# Patient Record
Sex: Female | Born: 1935 | Race: White | Hispanic: No | Smoking: Former smoker
Health system: Southern US, Community
[De-identification: ages and names within clinical notes are randomized; demographics above are authoritative.]

## PROBLEM LIST (undated history)

## (undated) DIAGNOSIS — E079 Disorder of thyroid, unspecified: Secondary | ICD-10-CM

## (undated) DIAGNOSIS — C50919 Malignant neoplasm of unspecified site of unspecified female breast: Secondary | ICD-10-CM

## (undated) DIAGNOSIS — I10 Essential (primary) hypertension: Secondary | ICD-10-CM

## (undated) HISTORY — PX: TONSILLECTOMY: SUR1361

## (undated) HISTORY — PX: ABDOMINAL HYSTERECTOMY: SHX81

## (undated) HISTORY — PX: APPENDECTOMY: SHX54

---

## 2004-12-22 ENCOUNTER — Ambulatory Visit: Payer: Self-pay | Admitting: Family Medicine

## 2006-02-03 ENCOUNTER — Ambulatory Visit: Payer: Self-pay | Admitting: Family Medicine

## 2007-02-08 ENCOUNTER — Ambulatory Visit: Payer: Self-pay | Admitting: Family Medicine

## 2008-02-10 ENCOUNTER — Ambulatory Visit: Payer: Self-pay | Admitting: Family Medicine

## 2009-02-13 ENCOUNTER — Ambulatory Visit: Payer: Self-pay | Admitting: Family Medicine

## 2010-02-17 ENCOUNTER — Ambulatory Visit: Payer: Self-pay | Admitting: Family Medicine

## 2010-07-06 DIAGNOSIS — C50919 Malignant neoplasm of unspecified site of unspecified female breast: Secondary | ICD-10-CM

## 2010-07-06 HISTORY — PX: MASTECTOMY: SHX3

## 2010-07-06 HISTORY — DX: Malignant neoplasm of unspecified site of unspecified female breast: C50.919

## 2010-08-20 ENCOUNTER — Ambulatory Visit: Payer: Self-pay

## 2010-09-18 ENCOUNTER — Ambulatory Visit: Payer: Self-pay | Admitting: Surgery

## 2010-09-19 LAB — PATHOLOGY REPORT

## 2010-10-01 ENCOUNTER — Ambulatory Visit: Payer: Self-pay | Admitting: Surgery

## 2010-10-06 LAB — PATHOLOGY REPORT

## 2010-10-15 ENCOUNTER — Ambulatory Visit: Payer: Self-pay | Admitting: Oncology

## 2010-11-04 ENCOUNTER — Ambulatory Visit: Payer: Self-pay | Admitting: Oncology

## 2011-01-14 ENCOUNTER — Ambulatory Visit: Payer: Self-pay | Admitting: Oncology

## 2011-02-04 ENCOUNTER — Ambulatory Visit: Payer: Self-pay | Admitting: Oncology

## 2011-02-19 ENCOUNTER — Ambulatory Visit: Payer: Self-pay | Admitting: Surgery

## 2011-04-15 ENCOUNTER — Ambulatory Visit: Payer: Self-pay | Admitting: Oncology

## 2011-05-07 ENCOUNTER — Ambulatory Visit: Payer: Self-pay | Admitting: Oncology

## 2011-07-16 ENCOUNTER — Ambulatory Visit: Payer: Self-pay | Admitting: Oncology

## 2011-08-07 ENCOUNTER — Ambulatory Visit: Payer: Self-pay | Admitting: Oncology

## 2011-10-15 ENCOUNTER — Ambulatory Visit: Payer: Self-pay | Admitting: Oncology

## 2011-11-04 ENCOUNTER — Ambulatory Visit: Payer: Self-pay | Admitting: Oncology

## 2011-12-05 ENCOUNTER — Ambulatory Visit: Payer: Self-pay | Admitting: Oncology

## 2012-02-25 ENCOUNTER — Ambulatory Visit: Payer: Self-pay | Admitting: Oncology

## 2012-04-28 ENCOUNTER — Ambulatory Visit: Payer: Self-pay | Admitting: Oncology

## 2012-05-06 ENCOUNTER — Ambulatory Visit: Payer: Self-pay | Admitting: Oncology

## 2012-10-18 ENCOUNTER — Ambulatory Visit: Payer: Self-pay | Admitting: Oncology

## 2012-11-03 ENCOUNTER — Ambulatory Visit: Payer: Self-pay | Admitting: Oncology

## 2012-11-20 LAB — COMPREHENSIVE METABOLIC PANEL
Albumin: 3.9 g/dL (ref 3.4–5.0)
Alkaline Phosphatase: 80 U/L (ref 50–136)
BUN: 33 mg/dL — ABNORMAL HIGH (ref 7–18)
Bilirubin,Total: 0.2 mg/dL (ref 0.2–1.0)
Chloride: 108 mmol/L — ABNORMAL HIGH (ref 98–107)
Co2: 25 mmol/L (ref 21–32)
Creatinine: 1.24 mg/dL (ref 0.60–1.30)
EGFR (African American): 45 — ABNORMAL LOW
EGFR (Non-African Amer.): 39 — ABNORMAL LOW
Sodium: 141 mmol/L (ref 136–145)
Total Protein: 7.4 g/dL (ref 6.4–8.2)

## 2012-11-20 LAB — CBC
HCT: 40.8 % (ref 35.0–47.0)
HGB: 13.7 g/dL (ref 12.0–16.0)
MCH: 30.2 pg (ref 26.0–34.0)
MCV: 90 fL (ref 80–100)
Platelet: 198 10*3/uL (ref 150–440)
RBC: 4.53 10*6/uL (ref 3.80–5.20)
RDW: 14 % (ref 11.5–14.5)
WBC: 13.3 10*3/uL — ABNORMAL HIGH (ref 3.6–11.0)

## 2012-11-21 ENCOUNTER — Inpatient Hospital Stay: Payer: Self-pay | Admitting: Internal Medicine

## 2012-11-21 LAB — URINALYSIS, COMPLETE
Bacteria: NONE SEEN
Bilirubin,UR: NEGATIVE
Glucose,UR: NEGATIVE mg/dL (ref 0–75)
Ketone: NEGATIVE
Leukocyte Esterase: NEGATIVE
Nitrite: NEGATIVE
Ph: 5 (ref 4.5–8.0)
Protein: NEGATIVE
RBC,UR: <1 /HPF (ref 0–5)
Specific Gravity: 1.008 (ref 1.003–1.030)
Squamous Epithelial: <1
WBC UR: <1 /HPF (ref 0–5)

## 2012-11-21 LAB — HEMOGLOBIN
HGB: 12.3 g/dL (ref 12.0–16.0)
HGB: 13 g/dL (ref 12.0–16.0)

## 2012-11-21 LAB — CLOSTRIDIUM DIFFICILE BY PCR

## 2012-11-22 LAB — BASIC METABOLIC PANEL
Calcium, Total: 8.2 mg/dL — ABNORMAL LOW (ref 8.5–10.1)
Chloride: 112 mmol/L — ABNORMAL HIGH (ref 98–107)
EGFR (Non-African Amer.): >60
Potassium: 3.5 mmol/L (ref 3.5–5.1)
Sodium: 141 mmol/L (ref 136–145)

## 2012-11-23 LAB — PATHOLOGY REPORT

## 2012-11-23 LAB — BASIC METABOLIC PANEL
BUN: 6 mg/dL — ABNORMAL LOW (ref 7–18)
Calcium, Total: 8.4 mg/dL — ABNORMAL LOW (ref 8.5–10.1)
Co2: 23 mmol/L (ref 21–32)
EGFR (African American): >60
EGFR (Non-African Amer.): >60
Potassium: 3.8 mmol/L (ref 3.5–5.1)
Sodium: 142 mmol/L (ref 136–145)

## 2012-11-23 LAB — CBC WITH DIFFERENTIAL/PLATELET
Basophil #: 0 10*3/uL (ref 0.0–0.1)
Eosinophil #: 0.2 10*3/uL (ref 0.0–0.7)
HGB: 11.1 g/dL — ABNORMAL LOW (ref 12.0–16.0)
Lymphocyte #: 1.7 10*3/uL (ref 1.0–3.6)
Lymphocyte %: 16.4 %
MCHC: 34.7 g/dL (ref 32.0–36.0)
MCV: 90 fL (ref 80–100)
Monocyte #: 1.2 x10 3/mm — ABNORMAL HIGH (ref 0.2–0.9)
Monocyte %: 11.3 %
Neutrophil #: 7.2 10*3/uL — ABNORMAL HIGH (ref 1.4–6.5)
Neutrophil %: 70.4 %
Platelet: 157 10*3/uL (ref 150–440)
RBC: 3.56 10*6/uL — ABNORMAL LOW (ref 3.80–5.20)
RDW: 13.9 % (ref 11.5–14.5)
WBC: 10.2 10*3/uL (ref 3.6–11.0)

## 2012-11-24 LAB — STOOL CULTURE

## 2012-12-04 ENCOUNTER — Ambulatory Visit: Payer: Self-pay | Admitting: Oncology

## 2012-12-10 IMAGING — US ULTRASOUND RIGHT BREAST
1 series · 17 of 25 positions shown · non-contrast
Comparison: none

REASON FOR EXAM: RT CYST 6 OCLOCK US IF NEEDED
COMMENTS:

[Series 1: ultrasound right breast · 17 of 34 slices shown]
[im 1/34]
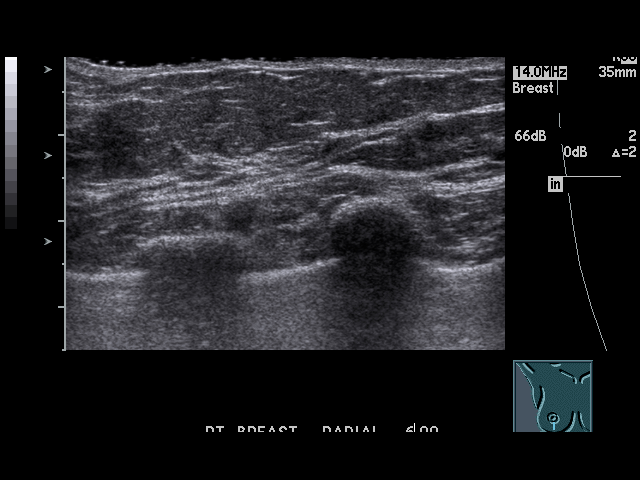
[im 3/34]
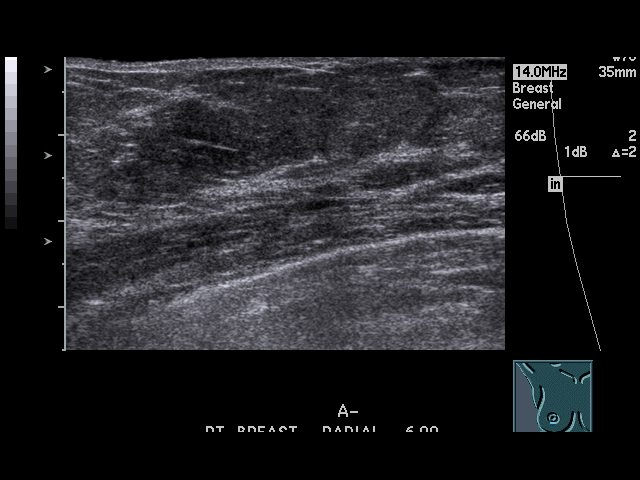
[im 5/34]
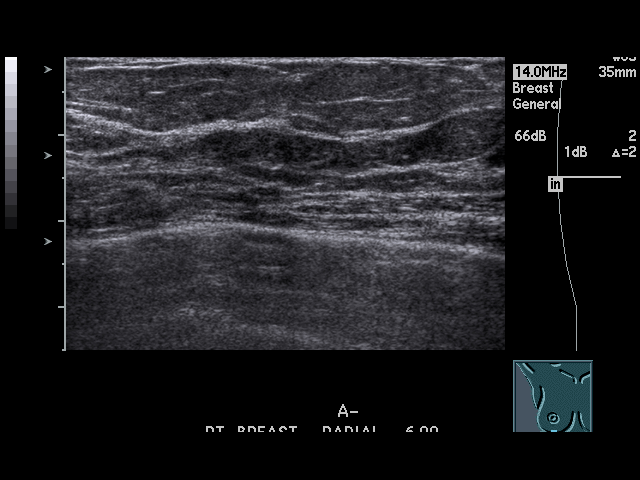
[im 7/34]
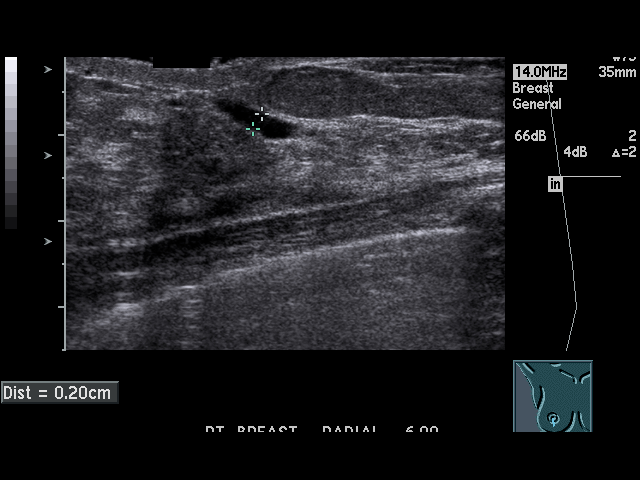
[im 9/34]
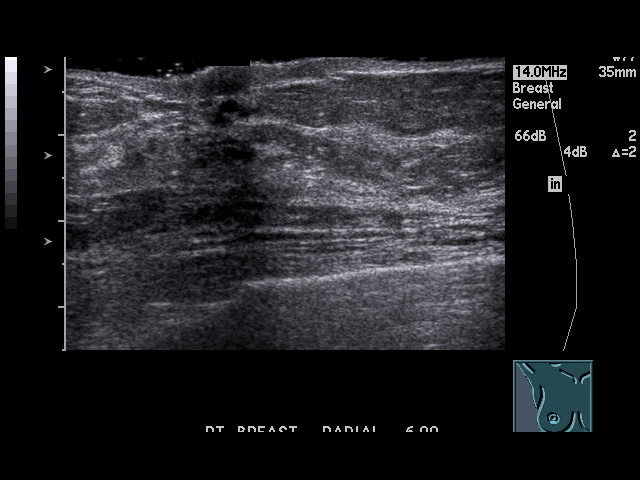
[im 12/34]
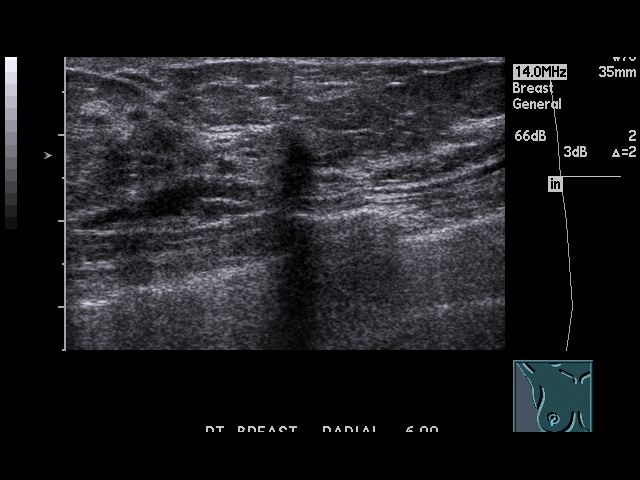
[im 13/34]
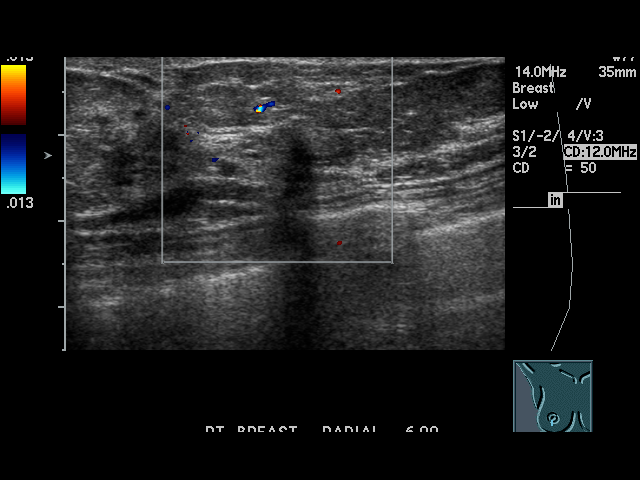
[im 16/34]
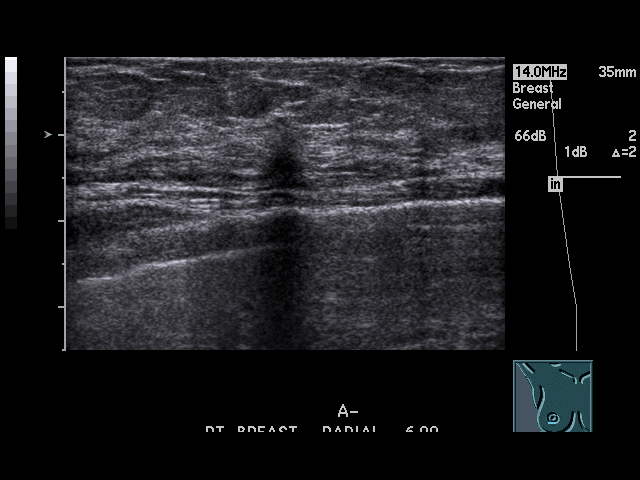
[im 17/34]
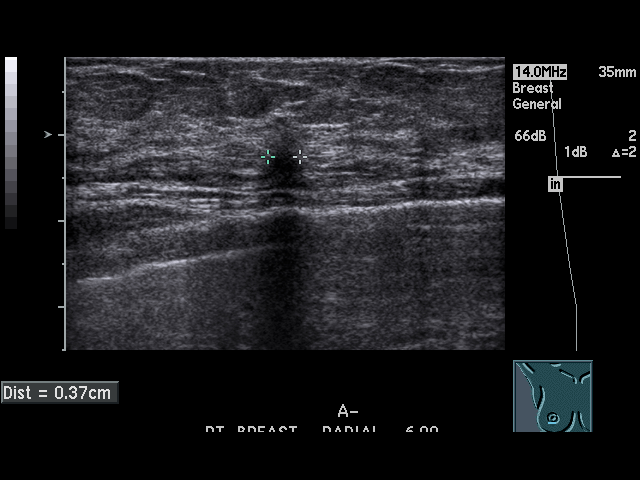
[im 18/34]
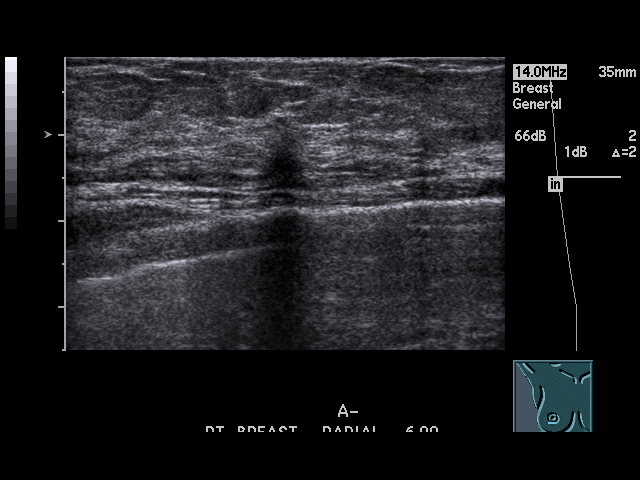
[im 21/34]
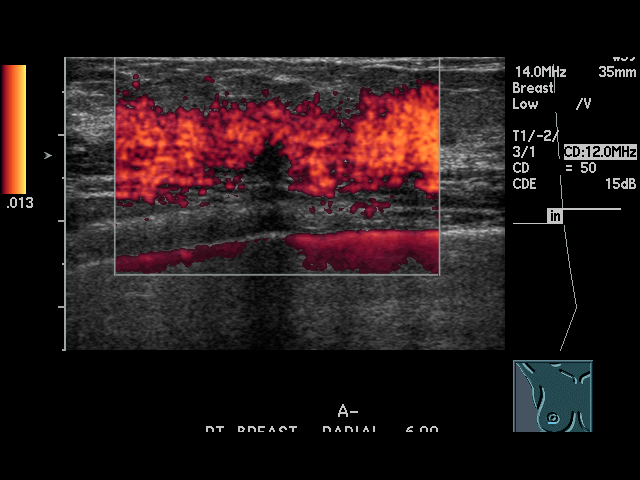
[im 23/34]
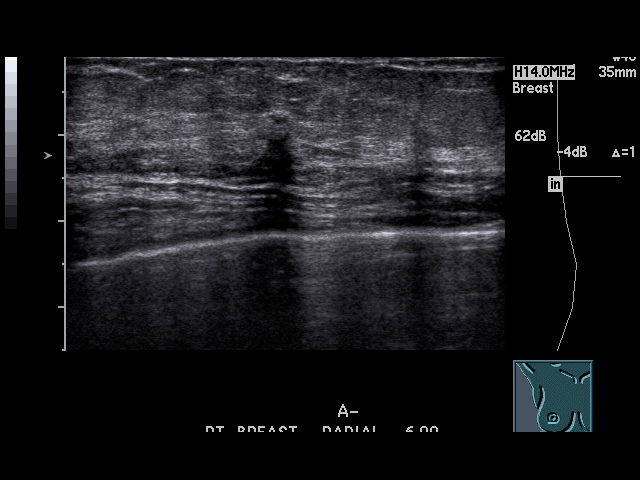
[im 25/34]
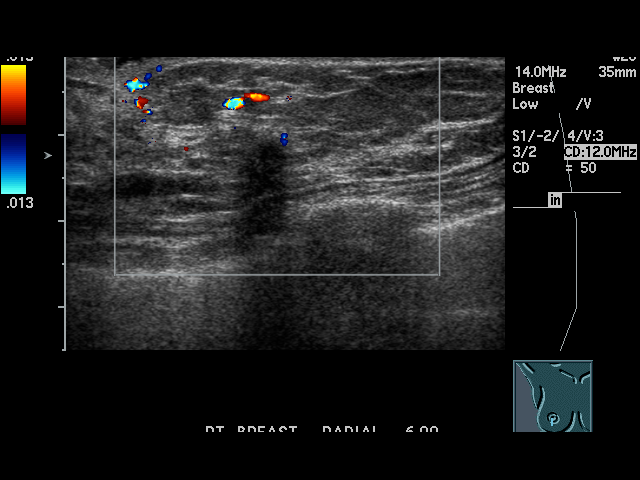
[im 27/34]
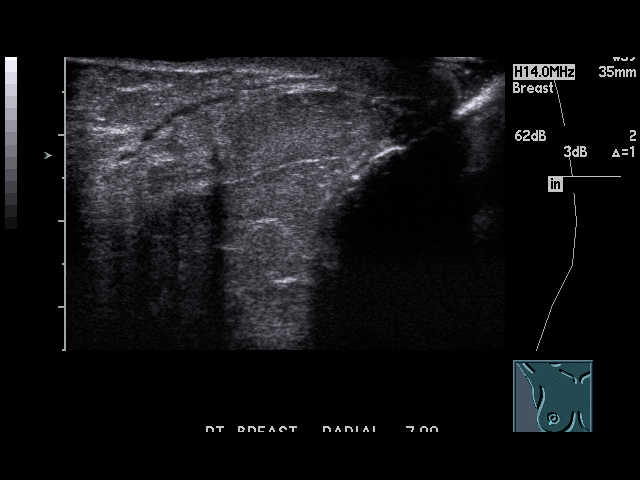
[im 29/34]
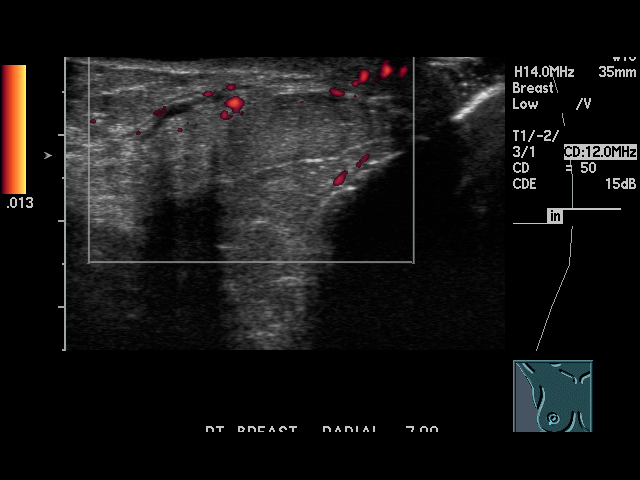
[im 31/34]
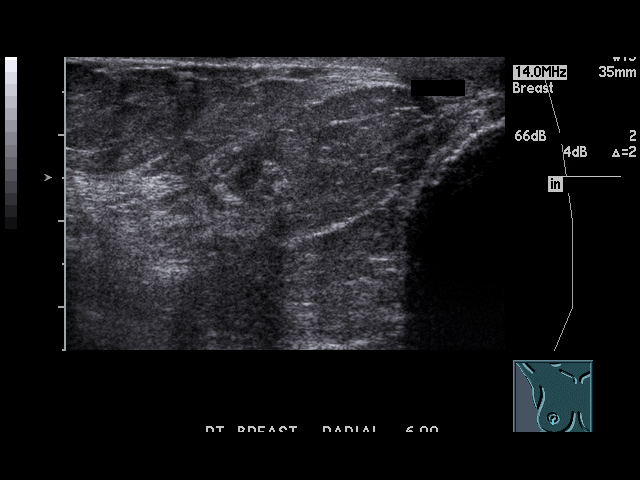
[im 34/34]
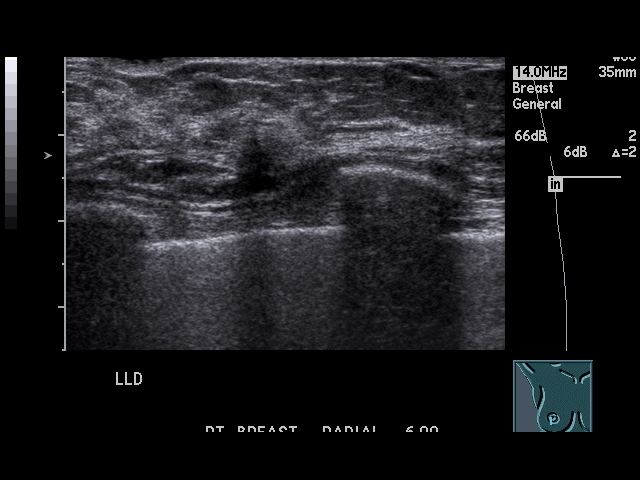

[17 of 25 positions shown; findings below may reference images not displayed]

PROCEDURE:     US  - US BREAST RIGHT  - August 20, 2010  [DATE]

RESULT:     The patient is reporting an area of palpable nodularity and a
subtle cutaneous dimple at approximately the 6 o'clock position.

At ultrasound there is a tiny shadowing focus at the 6 o'clock position
which appears to correspond to the area of palpable nodularity. It measures
just over 3 mm in diameter.  I do not see evidence of cystic change here.
More superficially near the nipple there is a dilated duct measuring
approximately 2 x 12 mm.
IMPRESSION: 1.There is a tiny 3 mm diameter shadowing focus at the 6 o'clock position of
the right breast. Please see the supplemental mammographic views of this
same day for final recommendations and BI-RADS classification.

## 2012-12-20 ENCOUNTER — Ambulatory Visit: Payer: Self-pay | Admitting: Gastroenterology

## 2013-02-27 ENCOUNTER — Ambulatory Visit: Payer: Self-pay | Admitting: Oncology

## 2013-04-21 ENCOUNTER — Ambulatory Visit: Payer: Self-pay | Admitting: Oncology

## 2013-05-06 ENCOUNTER — Ambulatory Visit: Payer: Self-pay | Admitting: Oncology

## 2013-05-06 ENCOUNTER — Inpatient Hospital Stay: Payer: Self-pay | Admitting: Internal Medicine

## 2013-05-06 LAB — COMPREHENSIVE METABOLIC PANEL
Albumin: 3.5 g/dL (ref 3.4–5.0)
Alkaline Phosphatase: 89 U/L (ref 50–136)
Anion Gap: 5 — ABNORMAL LOW (ref 7–16)
Calcium, Total: 9.1 mg/dL (ref 8.5–10.1)
Chloride: 102 mmol/L (ref 98–107)
Creatinine: 1.22 mg/dL (ref 0.60–1.30)
EGFR (African American): 46 — ABNORMAL LOW
EGFR (Non-African Amer.): 40 — ABNORMAL LOW
Glucose: 103 mg/dL — ABNORMAL HIGH (ref 65–99)
Osmolality: 269 (ref 275–301)
SGOT(AST): 25 U/L (ref 15–37)
Total Protein: 6.9 g/dL (ref 6.4–8.2)

## 2013-05-06 LAB — PROTIME-INR
INR: 1
Prothrombin Time: 13 secs (ref 11.5–14.7)

## 2013-05-06 LAB — URINALYSIS, COMPLETE
Bilirubin,UR: NEGATIVE
Glucose,UR: NEGATIVE mg/dL (ref 0–75)
Ketone: NEGATIVE
Ph: 6 (ref 4.5–8.0)
Protein: NEGATIVE
Specific Gravity: 1.005 (ref 1.003–1.030)
Squamous Epithelial: NONE SEEN

## 2013-05-06 LAB — CBC
HCT: 35.6 % (ref 35.0–47.0)
HGB: 12.1 g/dL (ref 12.0–16.0)
MCH: 30 pg (ref 26.0–34.0)
MCHC: 33.9 g/dL (ref 32.0–36.0)
MCV: 88 fL (ref 80–100)
RBC: 4.03 10*6/uL (ref 3.80–5.20)
RDW: 13.7 % (ref 11.5–14.5)
WBC: 15.9 10*3/uL — ABNORMAL HIGH (ref 3.6–11.0)

## 2013-05-06 LAB — APTT: Activated PTT: 29.8 secs (ref 23.6–35.9)

## 2013-05-07 LAB — CBC WITH DIFFERENTIAL/PLATELET
Basophil #: 0 10*3/uL (ref 0.0–0.1)
Basophil #: 0.1 10*3/uL (ref 0.0–0.1)
Basophil %: 0.2 %
Eosinophil #: 0.1 10*3/uL (ref 0.0–0.7)
Eosinophil #: 0.1 10*3/uL (ref 0.0–0.7)
Eosinophil %: 0.3 %
Eosinophil %: 0.4 %
Eosinophil %: 0.6 %
HCT: 39 % (ref 35.0–47.0)
HGB: 11.4 g/dL — ABNORMAL LOW (ref 12.0–16.0)
HGB: 11.8 g/dL — ABNORMAL LOW (ref 12.0–16.0)
Lymphocyte #: 1.7 10*3/uL (ref 1.0–3.6)
Lymphocyte #: 1.9 10*3/uL (ref 1.0–3.6)
Lymphocyte %: 11.1 %
Lymphocyte %: 14.1 %
MCHC: 33.7 g/dL (ref 32.0–36.0)
MCHC: 33.7 g/dL (ref 32.0–36.0)
MCHC: 34.4 g/dL (ref 32.0–36.0)
MCV: 88 fL (ref 80–100)
MCV: 89 fL (ref 80–100)
Monocyte #: 1.5 x10 3/mm — ABNORMAL HIGH (ref 0.2–0.9)
Monocyte %: 8.5 %
Monocyte %: 9.9 %
Neutrophil #: 10.2 10*3/uL — ABNORMAL HIGH (ref 1.4–6.5)
Neutrophil #: 14.4 10*3/uL — ABNORMAL HIGH (ref 1.4–6.5)
Neutrophil #: 15 10*3/uL — ABNORMAL HIGH (ref 1.4–6.5)
Neutrophil %: 81.3 %
Neutrophil %: 79.9 %
Platelet: 215 10*3/uL (ref 150–440)
Platelet: 223 10*3/uL (ref 150–440)
RBC: 3.83 10*6/uL (ref 3.80–5.20)
RDW: 13.3 % (ref 11.5–14.5)
RDW: 13.6 % (ref 11.5–14.5)
WBC: 13.7 10*3/uL — ABNORMAL HIGH (ref 3.6–11.0)
WBC: 17.7 10*3/uL — ABNORMAL HIGH (ref 3.6–11.0)
WBC: 18.8 10*3/uL — ABNORMAL HIGH (ref 3.6–11.0)

## 2013-05-07 LAB — BASIC METABOLIC PANEL
BUN: 16 mg/dL (ref 7–18)
Calcium, Total: 8.5 mg/dL (ref 8.5–10.1)
Chloride: 110 mmol/L — ABNORMAL HIGH (ref 98–107)
Co2: 23 mmol/L (ref 21–32)
Creatinine: 1.11 mg/dL (ref 0.60–1.30)
Glucose: 105 mg/dL — ABNORMAL HIGH (ref 65–99)
Sodium: 139 mmol/L (ref 136–145)

## 2013-05-08 LAB — BASIC METABOLIC PANEL
BUN: 9 mg/dL (ref 7–18)
Chloride: 112 mmol/L — ABNORMAL HIGH (ref 98–107)
Co2: 26 mmol/L (ref 21–32)
EGFR (Non-African Amer.): 55 — ABNORMAL LOW
Glucose: 94 mg/dL (ref 65–99)
Osmolality: 280 (ref 275–301)
Potassium: 3.9 mmol/L (ref 3.5–5.1)

## 2013-05-08 LAB — CBC WITH DIFFERENTIAL/PLATELET
Basophil #: 0 10*3/uL (ref 0.0–0.1)
Basophil %: 0.5 %
Eosinophil #: 0.1 10*3/uL (ref 0.0–0.7)
HCT: 31.2 % — ABNORMAL LOW (ref 35.0–47.0)
MCH: 30.3 pg (ref 26.0–34.0)
MCHC: 34.3 g/dL (ref 32.0–36.0)
MCV: 88 fL (ref 80–100)
Neutrophil #: 6.3 10*3/uL (ref 1.4–6.5)
Neutrophil %: 69.7 %
Platelet: 215 10*3/uL (ref 150–440)
RBC: 3.53 10*6/uL — ABNORMAL LOW (ref 3.80–5.20)

## 2013-05-08 LAB — URINE CULTURE

## 2013-10-13 ENCOUNTER — Ambulatory Visit: Payer: Self-pay | Admitting: Oncology

## 2013-11-03 ENCOUNTER — Ambulatory Visit: Payer: Self-pay | Admitting: Oncology

## 2013-12-04 ENCOUNTER — Ambulatory Visit: Payer: Self-pay | Admitting: Oncology

## 2014-03-02 ENCOUNTER — Ambulatory Visit: Payer: Self-pay | Admitting: Oncology

## 2014-04-12 ENCOUNTER — Ambulatory Visit: Payer: Self-pay | Admitting: Oncology

## 2014-05-06 ENCOUNTER — Ambulatory Visit: Payer: Self-pay | Admitting: Oncology

## 2014-07-13 DIAGNOSIS — H3531 Nonexudative age-related macular degeneration: Secondary | ICD-10-CM | POA: Diagnosis not present

## 2014-10-23 ENCOUNTER — Ambulatory Visit
Admit: 2014-10-23 | Disposition: A | Discharge status: Home / Self Care | Payer: Self-pay | Attending: Oncology | Admitting: Oncology

## 2014-10-23 DIAGNOSIS — Z170 Estrogen receptor positive status [ER+]: Secondary | ICD-10-CM | POA: Diagnosis not present

## 2014-10-23 DIAGNOSIS — C50919 Malignant neoplasm of unspecified site of unspecified female breast: Secondary | ICD-10-CM | POA: Diagnosis not present

## 2014-10-24 ENCOUNTER — Other Ambulatory Visit: Payer: Self-pay | Admitting: Oncology

## 2014-10-24 DIAGNOSIS — Z1231 Encounter for screening mammogram for malignant neoplasm of breast: Secondary | ICD-10-CM

## 2014-10-24 DIAGNOSIS — M810 Age-related osteoporosis without current pathological fracture: Secondary | ICD-10-CM

## 2014-10-26 NOTE — Consult Note (Signed)
Brief Consult Note: Diagnosis: BRBPR, colitis on CT.   Patient was seen by consultant.   Consult note dictated.   Orders entered.   Discussed with Attending MD.   Comments: Patient seen and examined. Colitis in decending/proximal rectosigmoid region noted on CT scan. patient continues to have rectal bleeding, hgb remains stable. No abdo pain, sick contacts, fevers. Tagged RBC scan completed, awaiting results. Also, will order stool studies (culture, wbc, cdiff, o&p). Continue abx for now. If no remarkable findings, will procede with flex sigmoidoscopy tomorrow with Dr Gustavo Lah. Prep with tap h20 enemas prior to procedure. Further reccs pending stool studies, RBC scan, and per clinical course. Full consult dictated. Will follow.  Electronic Signatures: Loren Racer M (PA-C)  (Signed 19-May-14 13:13)  Authored: Brief Consult Note   Last Updated: 19-May-14 13:13 by Sherald Barge (PA-C)

## 2014-10-26 NOTE — H&P (Signed)
PATIENT NAME:  Carrie [name], FIZER MR#:  [id number] DATE OF BIRTH:  05-03-1923  DATE OF ADMISSION:  11/21/2012  PRIMARY CARE PHYSICIAN: Dr. Glenis Smoker.    ONCOLOGIST: Dr. Delight Hoh.   REFERRING PHYSICIAN: Charlesetta Ivory.   CHIEF COMPLAINT: Bright red blood per rectum.   HISTORY OF PRESENT ILLNESS: Carrie [name] is an 81 year old Caucasian female with no prior history of GI bleed. She presented to the Emergency Room with 3 episodes of bright red blood per rectum that happened at home. She had recurrence of her lower GI bleed in the Emergency Department as well. Otherwise, the patient is not complaining of any problem. Denies any abdominal pain. Nevertheless, she mentioned that later after her several episodes of rectal bleeding, she developed some watery diarrhea as well. Denies any fever. No abdominal pain. No vomiting. Evaluation in the Emergency Department was unremarkable. Her vital signs were stable. The patient was admitted for further evaluation of her GI bleed.   REVIEW OF SYSTEMS:  CONSTITUTIONAL: Denies having any fever. No chills. No fatigue.  EYES: No blurring of vision. No double vision. 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 This is an old finding.  EARS, NOSE, THROAT: No hearing impairment. No sore throat. No dysphagia.  CARDIOVASCULAR: No chest pain. No shortness of breath. No palpitations. No syncope.  RESPIRATORY: No cough. No shortness of breath. No hemoptysis.  GASTROINTESTINAL: No abdominal pain. No vomiting, but she had rectal bleeding and a little diarrhea at the end.  GENITOURINARY: No dysuria. No frequency of urination.  MUSCULOSKELETAL: No joint pain or swelling. No muscular pain or swelling.  INTEGUMENTARY: No skin rash. No ulcers.  NEUROLOGY: No focal weakness. No seizure activity. No headache.  PSYCHIATRY: No anxiety. No depression.  ENDOCRINE: No polyuria or polydipsia. No heat or cold intolerance.   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   PAST SURGICAL HISTORY: Right breast mastectomy for breast cancer, appendectomy, thyroidectomy, total hysterectomy.   FAMILY HISTORY: Her sister suffered from breast cancer. Her mother died from complications of pneumonia in her 38s. Her father died at the age of 79 from a heart attack.   SOCIAL HABITS: Nonsmoker. She had quit in 1967. No history of alcohol abuse.   SOCIAL HISTORY: She is a widow. Lives at home alone.   ADMISSION MEDICATIONS: Levothyroxine 75 mcg once a day, vitamin D 1000 units once a day, Pred Forte 1% eyedrops 1 drop in the left eye once a day, letrozole 2.5 mg once a day, furosemide 20 mg once a day, aspirin 81 mg a day, amlodipine with benazepril 5/10 once a day.   ALLERGIES: AMOXICILLIN CAUSING SKIN RASH.   PHYSICAL EXAMINATION:  VITAL SIGNS: Blood pressure 166/77, respiratory 20, pulse 65, temperature 98, oxygen saturation 99%.  GENERAL APPEARANCE: Elderly female lying in bed in no acute distress.  HEAD AND NECK: No pallor. No icterus. No cyanosis. Ear examination revealed normal hearing, no discharge, no lesions. Examination of the nose revealed no discharge, no ulcers. Oropharyngeal examination revealed normal lips and tongue, no oral thrush, no ulcers. Eye examination revealed left eye scarring of the cornea. She is totally blind in this eye. The right eye shows a normal eyelid and conjunctiva. The pupil was about 5 to 6 mm, round and reactive to light. Neck is supple. Trachea at midline. No thyromegaly. No cervical lymphadenopathies. There is old transverse scar tissue at  the lower anterior area of the neck from previous thyroidectomy.  HEART: Normal S1, S2. There is a grade 3 to 4 mid systolic ejection murmur at the left sternal border and aortic area radiating to the neck. No carotid bruits.  RESPIRATORY: Normal breathing  pattern without use of accessory muscles. No rales. No wheezing.  ABDOMEN: Soft without tenderness. No hepatosplenomegaly. No masses. No hernias.  SKIN: No ulcers. No subcutaneous nodules.  MUSCULOSKELETAL: No joint swelling. No clubbing.  NEUROLOGIC: Cranial nerves II through XII were intact. No focal motor deficit.  PSYCHIATRIC: The patient is alert and oriented x3. Mood and affect were normal.   LABORATORY AND RADIOLOGIC FINDINGS: EKG showed normal sinus rhythm at a rate of 65 per minute. CAT scan of the abdomen showed thickening of the wall of the descending colon and the proximal portion of the rectosigmoid area consistent with colitis or diverticulitis. There are numerous sigmoid diverticula. There at least 2 calcified gallstones. Small hiatal hernia. Serum glucose 106, BUN 33, creatinine 1.2, sodium 141, potassium 5. Calcium 9.5. Normal liver function tests and liver transaminases. CBC showed white count of 13,000, hemoglobin 13, hematocrit 40, platelet count 198. Prothrombin time 12. INR 0.9.   ASSESSMENT:  1. Rectal bleeding.  2. Thickening of the descending colon either secondary to colitis versus diverticulitis.  3. Diverticulosis was noted with possible diverticulitis as well.  4. Systemic hypertension.  5. Aortic stenosis murmur.  6. Hypothyroidism.  7. History of right breast cancer status post right mastectomy.  8. Gallbladder stones, asymptomatic.  9. Left eye retinal detachment and blindness.  10. History of appendectomy, hysterectomy, thyroidectomy and right breast mastectomy.   PLAN: Will admit the patient to the telemetry unit. Monitor hemoglobin q.6 hours. I will hold the aspirin and the Lasix. Continue the rest of her home medications. Will obtain bleeding nuclear scan. GI consultation. Ciprofloxacin with Flagyl to cover for colitis versus diverticulitis. For deep vein thrombosis prophylaxis, will place the patient on TED stockings and also pneumatic pressures. The patient  states that she has a Living Will and her code status is FULL CODE unless she is in a vegetative state; then she does not want to stay on life support indefinitely. I discussed the findings with the patient and also with her niece who was in her room. I answered their questions.   Time spent in evaluating this patient took more than 55 minutes.    ____________________________ Clovis Pu. Lenore Manner, MD amd:gb D: 11/21/2012 00:32:05 ET T: 11/21/2012 01:17:00 ET JOB#: [id number]  cc: Clovis Pu. Lenore Manner, MD, <Dictator> Ellin Saba MD ELECTRONICALLY SIGNED 11/27/2012 23:32

## 2014-10-26 NOTE — Consult Note (Signed)
PATIENT NAME:  Carrie [name], BRIDWELL MR#:  [id number] DATE OF BIRTH:  [birth year]  DATE OF CONSULTATION:    REFERRING PHYSICIAN:  Dr. Manuella Ghazi CONSULTING PHYSICIAN:  Corey Skains, MD  REASON FOR CONSULTATION: Atrial fibrillation with aortic valve stenosis.   CHIEF COMPLAINT: "I felt weak."   HISTORY OF PRESENT ILLNESS: This is an 81 year old female with known severe aortic stenosis, hypertension and hyperlipidemia who has had some abdominal discomfort and had a CT scan for further evaluation. The patient recently has done fairly well from the cardiac standpoint with no evidence of chest pain, congestive heart failure or syncope or other significant cardiovascular symptoms. She has been stable. The patient has been on appropriate medication management for hypertension including Lotrel, occasionally Lasix. In addition, the patient has had the new onset of atrial fibrillation with rapid ventricular rate for approximately 5 minutes, with no evidence of significant symptoms of chest pain or heart failure, with full resolution and spontaneous conversion to normal sinus rhythm before medication management was necessary. After that, the patient did have bradycardia with a heart rate of 60 beats per minute; therefore, no additional medications were used. The patient does have concerns for bleeding and bruising issues and therefore has been on an aspirin for further risk reduction and would not use anticoagulation for a potential isolated episode of atrial fibrillation possibly related to current condition, illness, aortic stenosis and procedure.   REVIEW OF SYSTEMS: Remainder review of systems negative for vision change, ringing in the ears, hearing loss, cough, congestion, heartburn, nausea, vomiting, diarrhea, bloody stools, stomach pain, extremity pain, leg weakness, cramping of the buttocks, known blood clots, headaches, blackouts, dizzy spells, nosebleeds, congestion, trouble swallowing, frequent urination,  urination at night, muscle weakness, numbness, anxiety, depression, skin lesions, skin rashes.   PAST MEDICAL HISTORY: 1.  Hypertension.  2.  Hyperlipidemia.  3.  Aortic valve stenosis.   FAMILY HISTORY: No family members with known coronary artery disease or hypertension.   SOCIAL HISTORY: Currently denies alcohol or tobacco use.   ALLERGIES: As listed.   MEDICATIONS: As listed.   PHYSICAL EXAMINATION: VITAL SIGNS: Blood pressure is 122/68 bilaterally, heart rate 62 upright, reclining and regular.  GENERAL: She is a well appearing female in no acute distress.  HEENT: No icterus, thyromegaly, ulcers, hemorrhage or xanthelasma.  CARDIOVASCULAR: Regular rate and rhythm. Normal S1, soft S2, with a III/VI right upper sternal border murmur radiating throughout and into the carotids. PMI is inferiorly displaced. Carotid upstroke normal without bruit. Jugular venous pressure is normal.  LUNGS: Have few basilar crackles with normal respirations.  ABDOMEN: Soft, nontender, without hepatosplenomegaly or masses. Abdominal aorta is normal size without bruit.  EXTREMITIES: Show 2+ radial, femoral, dorsal pedal pulses with no lower extremity edema, cyanosis, clubbing or ulcers.  NEUROLOGIC: She is oriented to time, place and person with normal mood and affect.   ASSESSMENT: This is an 81 year old female with aortic stenosis, hypertension, hyperlipidemia, recurrent abdominal illness, with acute onset of atrial fibrillation with rapid ventricular rate, now spontaneously converted to normal sinus rhythm, now with no symptoms of heart failure or angina or syncope, appears to be isolated in nature   RECOMMENDATIONS: 1.  No cardiac intervention of atrial fibrillation, which appears to be isolated at this time and most consistent with current illness. 2.  No use in medications for atrial fibrillation or rapid heartbeat due to concerns of cause of bradycardia and/or side effects.  3.  Continue medication  management for hypertension.  4.  No  anticoagulation for atrial fibrillation due to concerns of bleeding risks and patient maintaining normal rhythm at this time.  5.  Recent echocardiogram has shown normal LV systolic function with ejection fraction greater than 50% with aortic valve stenosis and no reason for further intervention at that stage.  6.  Further treatment options after patient recovers from her recent procedure.   ____________________________ Corey Skains, MD bjk:jm D: 11/23/2012 17:27:00 ET T: 11/23/2012 20:02:22 ET JOB#: [id number]  cc: Corey Skains, MD, <Dictator> Corey Skains MD ELECTRONICALLY SIGNED 12/06/2012 7:49

## 2014-10-26 NOTE — Consult Note (Signed)
Chief Complaint:  Subjective/Chief Complaint seen for rectal bleeding.  tolerated enemas for flex sig, saw small amount of bright red blood with them.  small dark red stool early this am.  no abdominal pain or nausea.   VITAL SIGNS/ANCILLARY NOTES: **Vital Signs.:   20-May-14 07:51  Vital Signs Type Routine  Temperature Temperature (F) 98.1  Celsius 36.7  Temperature Source oral  Pulse Pulse 81  Respirations Respirations 20  Systolic BP Systolic BP [id number]  Diastolic BP (mmHg) Diastolic BP (mmHg) 72  Mean BP 91  Pulse Ox % Pulse Ox % 98  Pulse Ox Activity Level  At rest  Oxygen Delivery Room Air/ 21 %  *Intake and Output.:   20-May-14 01:52  Stool  small dark reddish brown liquid stool    06:19  Stool  small dark reddish brown mucoid stool   Brief Assessment:  Cardiac Regular   Respiratory clear BS   Gastrointestinal details normal Soft  Nontender  Nondistended  No masses palpable  Bowel sounds normal   Lab Results: Routine Hem:  18-May-14 17:44   Hemoglobin (CBC) 13.7  19-May-14 02:08   Hemoglobin (CBC) 12.3 (Result(s) reported on 21 Nov 2012 at 02:28AM.)    07:53   Hemoglobin (CBC) 13.0 (Result(s) reported on 21 Nov 2012 at 08:24AM.)   Radiology Results: Cardiology:    18-May-14 18:06, ED ECG  ECG interpretation   Normal sinus rhythm  Possible Inferior infarct , age undetermined  Abnormal ECG  No previous ECGs available  ----------unconfirmed----------  Confirmed by OVERREAD, NOT ([id number]), editor PEARSON, BARBARA ([id number]) on 11/21/2012 10:26:12 AM  CT:    18-May-14 22:10, CT Abdomen and Pelvis Without Contrast  CT Abdomen and Pelvis Without Contrast   REASON FOR EXAM:    (1) bloody diarrhea eval colitis; (2) bloody diarrhea   eval colitis  COMMENTS:       PROCEDURE: CT  - CT ABDOMEN AND PELVIS W0  - Nov 20 2012 10:10PM     RESULT: Axial CT scanning was performed through the abdomen and pelvis   with reconstructions at 3 mm intervals and slice thicknesses. The  patient   received oral contrast only. Review of multiplanar reconstructed images   was performed separately on the VIA monitor.    The stomach is moderately distended with gas and the oral contrast. The   duodenum and jejunum and ileum exhibit no evidence of ileus nor of   obstruction or inflammation. The appendix is not discretely identified.   The ascending and transverse colons exhibit normal wall thickness and     contrast withinthe lumen. There is mild thickening of the wall of the   descending colon with luminal narrowing through the descending colon and   proximal rectosigmoid. There are a few sigmoid diverticula but there is   no evidence of sigmoid diverticulitis.    There are rim calcified stones in the gallbladder measuring up to 12 mm   in diameter. There is no pericholecystic fluid or gallbladder wall   thickening. There is no abnormal fluid in the porta hepatis. The   pancreas, spleen, right adrenal gland, and kidneys are normal in   appearance. There is mild enlargement of the left adrenal gland. The   caliber of the abdominal aorta is normal.    Within the pelvis the partially distended urinary bladder is normal in   appearance. The uterus is surgically absent. There are no adnexal masses.   A few phleboliths are present. There is no free pelvic fluid.  The psoas     musculature appears normal. There is no inguinal nor significant   umbilical hernia.    The lumbar vertebral bodies are preserved in height. There is mild disc   space narrowing at L4-L5 and at L5-S1. The lung bases exhibit no acute   abnormalities.    IMPRESSION:   1. There is thickening of the wall of the descending colon and very   proximal portion of the rectosigmoid consistent with colitis or   diverticulitis. While there are numerous sigmoid diverticula I do not see   significant diverticulosis of the descending colon.  2. There is no evidence of small bowel obstruction or ileus or  enteritis.  3. There are at least two rim calcified gallstones present. There is no   CT evidence of acute cholecystitis.  4. The liver, pancreas, spleen, and kidneys are normal in appearance.   There is mild enlargement of the left adrenal gland.  5. There is a small hiatal hernia. No acute abnormality of the stomach is   demonstrated.     Dictation Site: 5        Verified By: DAVID A. Martinique, M.D., MD  Nuclear Med:    19-May-14 10:02, GI Blood Loss Study - Nuc Med  GI Blood Loss Study - Nuc Med   REASON FOR EXAM:    rectal bleeding  COMMENTS:   LMP: Post-Menopausal    PROCEDURE: NM  - NM GI BLOOD LOSS STUDY  - Nov 21 2012 10:02AM     RESULT: Comparison: None.    Radiopharmaceutical: 24.798 mCi Tc-71m    Technique: Standard departmental tight red cell scan was performed.   Dynamic, anterior planar images of the abdomen were obtained over 60   minutes.    Findings:   There is activity within the liver and spleen. There is radiotracer     activity conforming to the morphology of the stomach,which is likely   physiologic. Otherwise, no scintigraphic evidence of active extravasation   of blood into the bowel.    IMPRESSION:   No scintigraphic evidence of active extravasation of blood into the bowel.    Dictation site: 2        Verified By: Gregor Hams, M.D., MD   Assessment/Plan:  Assessment/Plan:  Assessment 1) rectal bleeding, posible colitis on ct.  no abdominal pain, DDX anal outlet bleeding, colitis infective versus ischemic.   Plan 1) sedated flexible sigmoidoscopy today, further recs to follow.  I have discussed the risks benefits and complications of flex sig to include not limited to bleeding infection perofration and sedation and she wishes to proceed.   Electronic Signatures: Loistine Simas (MD)  (Signed 20-May-14 11:23)  Authored: Chief Complaint, VITAL SIGNS/ANCILLARY NOTES, Brief Assessment, Lab Results, Radiology Results,  Assessment/Plan   Last Updated: 20-May-14 11:23 by Loistine Simas (MD)

## 2014-10-26 NOTE — Discharge Summary (Signed)
PATIENT NAME:  Carrie [name], Carrie [name] MR#:  [id number] DATE OF BIRTH:  06/07/1923  DATE OF ADMISSION:  05/06/2013 DATE OF DISCHARGE:  05/08/2013  PRIMARY CARE PHYSICIAN: Catheryn Bacon, CNM  FINAL DIAGNOSES: 1.  Abdominal pain, left lower quadrant; rectal bleeding; leukocytosis.  2.  Hypertension.  3.  Hypothyroidism.  4.  Urinary tract infection.   MEDICATIONS ON DISCHARGE: Include amlodipine/benazepril 5/10 one capsule in the morning, levothyroxine 75 mcg daily, letrozole 2.5 mg daily, Prednisone Forte acetate ophthalmic suspension 1 drop affected eye once a day, vitamin D3 at 1000 international units daily, Ocuvite/PreserVision 1 tablet twice a day, Cipro 250 mg every 12 hours for 3 days.  FOLLOWUP: With either Dr. Gustavo Lah or Dr. Allen Norris of gastroenterology, in 1 to 2 weeks with Glenis Smoker.   HOSPITAL COURSE: The patient was admitted 05/06/2013 and discharged 05/08/2013. Came in with bright red blood per rectum. Admitted with suspected diverticular bleeding. Given IV fluids and serial hemoglobins were ordered.   Laboratory and radiological data during the hospital course included urine culture that grew out E. coli greater than 100,000 pansensitive. Urinalysis: 1+ leukocyte esterase. INR 1.0. Glucose 103, BUN 25, creatinine 1.22, sodium 132, potassium 4.4, chloride 102, CO2 of 25, calcium 9.1. Liver function tests: Normal range. White blood cell count 15.9, hemoglobin and hematocrit 12.1 and 35.6, platelet count of 237. Lipase of 123. EKG showed sinus rhythm with sinus arrhythmia. Stool for C. difficile was negative. White blood cells in the stool, zero to 5 per field, all PMNs. Stool culture negative. CT scan of the abdomen and pelvis showed gallstones, otherwise negative. Hemoglobin upon discharge 10.7. White blood cell count upon discharge 9.0. Creatinine upon discharge 0.94.   HOSPITAL COURSE PER PROBLEM LIST:  1.  For the patient's abdominal pain in the left lower quadrant, rectal bleeding and  leukocytosis, the patient did have further rectal bleeding, but not much. Hemoglobin remained stable. I did get gastroenterology, Dr. Allen Norris, to see the patient. The patient is not interested in further luminal evaluation. Looking back at previous colonoscopy, could not be advanced through a possible stricture. The patient is not interested in treatment of whatever that stricture which would be. The patient was advised to avoid drinking milk products, which may be part of the patient's cause of her diarrhea. The rectal bleeding could be secondary to diarrhea, diverticular bleed, ischemic colitis or more ominous etiology of a cancer that was unable to be seen secondary to stricture. The patient did not have further bleeding and was discharged home in stable condition. Hemoglobin remained relatively stable. Came down with IV fluid hydration. Leukocytosis also improved.  2.  Hypertension: She is on amlodipine/benazepril.  3.  For her hypothyroidism, she is on levothyroxine.  4.  For her UTI that grew out E. coli, could be the cause of her left lower quadrant abdominal pain and leukocytosis. I will treat this. The patient did get a dose of Rocephin in the ER, switched over to Levaquin in the hospital, and I did switch it over to Cipro upon discharge for another 3 days (a cheaper medications than the Levaquin).  TIME SPENT ON DISCHARGE: 40 minutes.   ____________________________ Tana Conch. Leslye Peer, MD rjw:jm D: 05/08/2013 16:02:04 ET T: 05/08/2013 19:42:02 ET JOB#: [id number]  cc: Tana Conch. Leslye Peer, MD, <Dictator> Catheryn Bacon, CNM Marisue Brooklyn MD ELECTRONICALLY SIGNED 05/11/2013 13:31

## 2014-10-26 NOTE — Consult Note (Signed)
PATIENT NAME:  Carrie [name], Carrie [name] MR#:  [id number] DATE OF BIRTH:  07-06-1923  DATE OF CONSULTATION:  05/08/2013  REFERRING PHYSICIAN:   CONSULTING PHYSICIAN:  Lucilla Lame, MD  CONSULTING SERVICE: Gastroenterology.   CONSULTING PHYSICIAN:  Lucilla Lame, M.D.   REASON FOR CONSULTATION: Rectal bleeding , ischemic colitis versus diverticular bleed.   HISTORY OF PRESENT ILLNESS: This patient is an 81 year old woman who had a history of ischemic colitis last year. The patient had a colonoscopy by Dr. Gustavo Lah that she reports to have been incomplete due to inability to pass the scope to the cecum. The patient was recommended to undergo a barium enema, but she had not followed up with those recommendations. She does report that she had a large amount of bright red blood. On admission, she had a CT scan that showed no sign of active colitis. The patient also reports that she has been having loose bowel movements for the last few weeks. She denies any recent antibiotic use. She does drink approximately 1 gallon of milk per week. Since admission, she states that they most recent stools this morning had some small amount of blood in the first morning stool, but then later on she had a stool without any blood in it. She also reports some lower abdominal pain which she reports to be crampy abdominal pain. The patient's last colonoscopy was in June of this year. There is no report of any nausea, vomiting, hematemesis or black stools.   PAST MEDICAL HISTORY: Hypertension, hypothyroidism, aortic stenosis, cataract extraction, thyroidectomy, appendectomy, hysterectomy.   ALLERGIES: AMOXICILLIN.   HOME MEDICATIONS:  Vitamin D, prednisone, levothyroxine, letrozole, amlodipine/benazepril.   SOCIAL HISTORY: The patient smoked many years ago. Denies any alcohol or drug use.   FAMILY HISTORY: Noncontributory.   REVIEW OF SYSTEMS: A 10-point review of systems negative except what was stated above.   PHYSICAL  EXAMINATION: GENERAL: The patient sitting in a chair in no apparent distress, answering questions appropriately, alert and oriented x 3.  VITAL SIGNS: Temperature 98, pulse 66, respirations 20, blood pressure 159/76, pulse oximetry 96%.  HEENT: Normocephalic, atraumatic. Extraocular movements intact. Pupils equally round and reactive to light and accommodation without JVD, without lymphadenopathy.  LUNGS: Clear to auscultation bilaterally.  HEART: Regular rate and rhythm with a systolic murmur heard.  ABDOMEN: Soft, nontender, nondistended without hepatosplenomegaly.  EXTREMITIES: Without cyanosis, clubbing or edema.  SKIN: Without any rashes or lesions.  NEUROLOGICAL: Grossly intact.   ANCILLARY SERVICES: Labs with hemoglobin of 12 on admission that went down to 13.1 yesterday and was down to 10.7 today.   CT scan showed cholelithiasis, some diverticulosis without any acute diverticulitis and a small to moderate hiatal hernia.   ASSESSMENT AND PLAN: This patient is an 81 year old woman who had an incomplete colonoscopy back in June by Dr. Gustavo Lah with inability to pass the scope through the entire colon. The patient states that she would not consider having a repeat colonoscopy due to the fact that if there was a cancer or something found she would not want anything done about it. She may have had a diverticular bleed versus ischemic colitis. The treatment of both is supportive and no need for repeating a colonoscopy at this time, in light of the patient's wishes. The patient has been explained the plan and agrees with it. Thank you very much for involving me in the care of this patient. If you have any questions, please do not hesitate to call.   ____________________________ Lucilla Lame, MD dw:cc  D: 05/08/2013 18:21:16 ET T: 05/08/2013 19:09:51 ET JOB#: [id number]  cc: Lucilla Lame, MD, <Dictator> Lucilla Lame MD ELECTRONICALLY SIGNED 05/09/2013 7:56

## 2014-10-26 NOTE — Consult Note (Signed)
PATIENT NAME:  Carrie [name], Carrie [name] MR#:  [id number] DATE OF BIRTH:  [birth year]  DATE OF CONSULTATION:  11/21/2012  REFERRING PHYSICIAN:  Clovis Pu. Lenore Manner, MD CONSULTING PHYSICIAN:  Corky Sox. Zettie Pho, PA-C ATTENDING GASTROENTEROLOGIST: Lollie Sails, MD  REASON FOR CONSULTATION: Rectal bleeding.  HISTORY OF PRESENT ILLNESS: This is a pleasant 81 year old female who initially presented to the Emergency Department with concerns of sudden onset bright red blood per rectum. She states that she got up to use the restroom to have a bowel movement and did notice accompanying bright red blood, both with the stool and following the bowel movement. Since that time, she has had repeated episodes where she feels the urge to have a bowel movement, but will get up to use the restroom and only blood will come out. This has persisted for the past 48 hours. In the ER, her hemoglobin was stable at 13. However, a CT scan of the abdomen and pelvis was obtained and it was notable for descending colon wall thickening extending into the proximal rectosigmoid region. This was suggestive of colitis versus diverticulitis. Because of this, she was started empirically on Cipro and Flagyl and admitted for further evaluation. Through the night, she has continued to have some rectal bleeding. Vital signs have been stable. There is no nausea or vomiting, but she does state last week she did have an episode of nausea. There have been no fevers or chills. No chest pain or shortness of breath. No dysphagia. The patient states she has never had a colonoscopy. There is no associated abdominal or rectal pain with this. No unintentional weight changes. No other current symptomatic concerns or complaints. She does have an appetite.   PAST MEDICAL HISTORY: Left eye blindness, hypertension, hypothyroidism, aortic stenosis and a history of breast cancer.   PAST SURGICAL HISTORY: Mastectomy secondary to cancer, appendectomy, thyroidectomy and a total  hysterectomy.   SOCIAL HISTORY: The patient does have a remote history of tobacco use, quitting in 1967. She denies any current alcohol, tobacco or illicit drug use.   FAMILY HISTORY: Sister had breast cancer. No known family history of GI malignancy, colon polyps or IBD.   HOME MEDICATIONS: Vitamin D, Letrozole, furosemide, baby aspirin, levothyroxine, amlodipine and benazepril.   ALLERGIES: PENICILLIN.   REVIEW OF SYSTEMS: A 10-system review of systems was obtained on the patient. All pertinent positives are mentioned above and otherwise negative.   OBJECTIVE: VITAL SIGNS: Blood pressure 117/68, heart rate 74, respirations 18, temperature 98.9. Bedside pulse oximetry is 98% on room air.  GENERAL: This is a pleasant 81 year old female accompanied by 2 nephews at bedside. Alert and oriented x 3, in no acute distress.  HEAD: Atraumatic, normocephalic.  NECK: Supple. No lymphadenopathy noted.  HEENT: Sclerae anicteric. Mucous membranes moist.  LUNGS: Respirations are even and unlabored.  HEART: Regular rate and rhythm. S1, S2 noted.  ABDOMEN: Soft, nontender, nondistended. Normoactive bowel sounds noted in all 4 quadrants. No masses, hernias or organomegaly appreciated.  RECTAL: Deferred.  PSYCHIATRIC: Appropriate mood and affect.   LABORATORY DATA: White blood cells 13.3, hemoglobin 13, hematocrit 40.8, platelets 198. Sodium 141, potassium 5, BUN 33, creatinine 1.24, glucose 106. Bilirubin 0.2, alkaline phosphatase 80, ALT 20, AST 26. INR 0.9. PT 12.5.   IMAGING: CT of the abdomen and pelvis was obtained on the patient, notable for descending colon wall thickening extending into the proximal rectosigmoid region, suspicious for colitis versus diverticulitis. There were also 2 calcified gallstones as well as a small hiatal hernia.  Tagged red blood cell scan was obtained on the patient just a few hours ago and these results are still currently pending.   ASSESSMENT: 1.  Bright red blood  per rectum.  2.  Diarrhea.  3.  Abnormal CT scan notable for descending colon wall thickening extending into the proximal rectosigmoid, suspicious for colitis versus diverticulitis.   PLAN: I have discussed this patient's case in detail with Dr. Loistine Simas who is involved in the development of the patient's plan of care. At this time, we do recommend obtaining a stool sample, as she has had some accompanying diarrhea with the changes of bleeding in her stool. We will check a culture as well as white blood cells, C. difficile and ova and parasite. We do agree with her being on Cipro and Flagyl for now in the event that this is an infectious colitis. She did undergo a tagged red blood cell scan to evaluate the source of her bleeding and these results are still pending. Therefore, we will await these findings to make further recommendations. We do recommend checking serial hemoglobins, although currently she is remaining stable. Should the tagged red blood cell scan and the above stool studies not give [place] remarkable findings, then  we can have plans to proceed with a flexible sigmoidoscopy tomorrow with Dr. Gustavo Lah. She can be prepped just before the procedure with tap water enemas. Therefore, we can certainly evaluate the colitis findings on CT scan and take biopsies if needed. This was discussed in detail with the patient and her nephews who verbalized understanding. We will continue to monitor this patient throughout hospitalization and make further recommendations pending above and per clinical course. All questions were answered.   Thank you so much for this consultation and for allowing [place] to participate in the patient's plan of care.  ____________________________ Corky Sox. Jane Birkel, PA-C kme:jm D: 11/21/2012 15:27:30 ET T: 11/21/2012 16:09:02 ET JOB#: [id number]  cc: Corky Sox. Coda Filler, PA-C, <Dictator> [place] PA ELECTRONICALLY SIGNED 11/22/2012 15:35

## 2014-10-26 NOTE — Consult Note (Signed)
Brief Consult Note: Diagnosis: Lower GI bleed with lower abd pain and history of ischemic colitis.   Patient was seen by consultant.   Consult note dictated.   Comments: The DDx is hemorrhoids vs. ischemia vs. diverticular bleed. She had a partial colonoscopy in Jun. The patient is not interested in a repeat colonoscopy and has been doing well since admission. Bleeding less this am and she says almost gone with a bowel movement after the AM BM. Will recommend discharge when ready and follow up as needed.  Electronic Signatures: Lucilla Lame (MD)  (Signed [id number] 13:44)  Authored: Brief Consult Note   Last Updated: 03-Nov-14 13:44 by Lucilla Lame (MD)

## 2014-10-26 NOTE — Consult Note (Signed)
Chief Complaint:  Subjective/Chief Complaint seen for rectal bleeding.  GI bleeding scan negative.   Denies abdominal pain with episode.  no n/v.  Last bm a little ago, darker than earlier.   VITAL SIGNS/ANCILLARY NOTES: **Vital Signs.:   19-May-14 16:00  Vital Signs Type Routine  Temperature Temperature (F) 98.5  Celsius 36.9  Temperature Source oral  Pulse Pulse 70  Respirations Respirations 21  Systolic BP Systolic BP 063  Diastolic BP (mmHg) Diastolic BP (mmHg) 52  Mean BP 78  Pulse Ox % Pulse Ox % 98  Pulse Ox Activity Level  At rest  Oxygen Delivery Room Air/ 21 %  *Intake and Output.:   19-May-14 03:00  Stool  moderate brown liquid stool with bright red streaks noted   Brief Assessment:  Cardiac Regular  murmur present   Respiratory normal resp effort  clear BS   Gastrointestinal details normal Soft  Nontender  No masses palpable  Bowel sounds normal   Lab Results: Hepatic:  18-May-14 17:44   Bilirubin, Total 0.2  Alkaline Phosphatase 80  SGPT (ALT) 20  SGOT (AST) 26  Total Protein, Serum 7.4  Albumin, Serum 3.9  Cardiology:  18-May-14 18:06   Ventricular Rate 65  Atrial Rate 65  P-R Interval 186  QRS Duration 82  QT 392  QTc 407  P Axis 67  R Axis 9  T Axis 38  ECG interpretation Normal sinus rhythm Possible Inferior infarct , age undetermined Abnormal ECG No previous ECGs available ----------unconfirmed---------- Confirmed by OVERREAD, NOT ([id number]), editor PEARSON, BARBARA ([id number]) on 11/21/2012 10:26:12 AM  Routine Chem:  18-May-14 17:44   Glucose, Serum  106  BUN  33  Creatinine (comp) 1.24  Sodium, Serum 141  Potassium, Serum 5.0  Chloride, Serum  108  CO2, Serum 25  Calcium (Total), Serum 9.4  Osmolality (calc) 289  eGFR (African American)  45  eGFR (Non-African American)  39 (eGFR values <67m/min/1.73 m2 may be an indication of chronic kidney disease (CKD). Calculated eGFR is useful in patients with stable renal function. The eGFR  calculation will not be reliable in acutely ill patients when serum creatinine is changing rapidly. It is not useful in  patients on dialysis. The eGFR calculation may not be applicable to patients at the low and high extremes of body sizes, pregnant women, and vegetarians.)  Anion Gap 8  Routine UA:  18-May-14 23:44   Color (UA) Straw  Clarity (UA) Clear  Glucose (UA) Negative  Bilirubin (UA) Negative  Ketones (UA) Negative  Specific Gravity (UA) 1.008  Blood (UA) 1+  pH (UA) 5.0  Protein (UA) Negative  Nitrite (UA) Negative  Leukocyte Esterase (UA) Negative (Result(s) reported on 21 Nov 2012 at 12:02AM.)  RBC (UA) <1 /HPF  WBC (UA) <1 /HPF  Bacteria (UA) NONE SEEN  Epithelial Cells (UA) <1 /HPF (Result(s) reported on 21 Nov 2012 at 12:02AM.)  Routine Coag:  18-May-14 17:44   Prothrombin 12.5  INR 0.9 (INR reference interval applies to patients on anticoagulant therapy. A single INR therapeutic range for coumarins is not optimal for all indications; however, the suggested range for most indications is 2.0 - 3.0. Exceptions to the INR Reference Range may include: Prosthetic heart valves, acute myocardial infarction, prevention of myocardial infarction, and combinations of aspirin and anticoagulant. The need for a higher or lower target INR must be assessed individually. Reference: The Pharmacology and Management of the Vitamin K  antagonists: the seventh ACCP Conference on Antithrombotic and Thrombolytic Therapy. Chest.2004 Sept:126 (  3suppl): [id number]. A HCT value >55% may artifactually increase the PT.  In one study,  the increase was an average of 25%. Reference:  "Effect on Routine and Special Coagulation Testing Values of Citrate Anticoagulant Adjustment in Patients with High HCT Values." American Journal of Clinical Pathology 2006;126:400-405.)  Routine Hem:  18-May-14 17:44   Hemoglobin (CBC) 13.7  WBC (CBC)  13.3  RBC (CBC) 4.53  Hematocrit (CBC) 40.8  Platelet  Count (CBC) 198 (Result(s) reported on 20 Nov 2012 at 06:08PM.)  MCV 90  MCH 30.2  MCHC 33.5  RDW 14.0  19-May-14 02:08   Hemoglobin (CBC) 12.3 (Result(s) reported on 21 Nov 2012 at 02:28AM.)    07:53   Hemoglobin (CBC) 13.0 (Result(s) reported on 21 Nov 2012 at 08:24AM.)   Radiology Results: CT:    18-May-14 22:10, CT Abdomen and Pelvis Without Contrast  CT Abdomen and Pelvis Without Contrast   REASON FOR EXAM:    (1) bloody diarrhea eval colitis; (2) bloody diarrhea   eval colitis  COMMENTS:       PROCEDURE: CT  - CT ABDOMEN AND PELVIS W0  - Nov 20 2012 10:10PM     RESULT: Axial CT scanning was performed through the abdomen and pelvis   with reconstructions at 3 mm intervals and slice thicknesses. The patient   received oral contrast only. Review of multiplanar reconstructed images   was performed separately on the VIA monitor.    The stomach is moderately distended with gas and the oral contrast. The   duodenum and jejunum and ileum exhibit no evidence of ileus nor of   obstruction or inflammation. The appendix is not discretely identified.   The ascending and transverse colons exhibit normal wall thickness and     contrast withinthe lumen. There is mild thickening of the wall of the   descending colon with luminal narrowing through the descending colon and   proximal rectosigmoid. There are a few sigmoid diverticula but there is   no evidence of sigmoid diverticulitis.    There are rim calcified stones in the gallbladder measuring up to 12 mm   in diameter. There is no pericholecystic fluid or gallbladder wall   thickening. There is no abnormal fluid in the porta hepatis. The   pancreas, spleen, right adrenal gland, and kidneys are normal in   appearance. There is mild enlargement of the left adrenal gland. The   caliber of the abdominal aorta is normal.    Within the pelvis the partially distended urinary bladder is normal in   appearance. The uterus is surgically  absent. There are no adnexal masses.   A few phleboliths are present. There is no free pelvic fluid. The psoas     musculature appears normal. There is no inguinal nor significant   umbilical hernia.    The lumbar vertebral bodies are preserved in height. There is mild disc   space narrowing at L4-L5 and at L5-S1. The lung bases exhibit no acute   abnormalities.    IMPRESSION:   1. There is thickening of the wall of the descending colon and very   proximal portion of the rectosigmoid consistent with colitis or   diverticulitis. While there are numerous sigmoid diverticula I do not see   significant diverticulosis of the descending colon.  2. There is no evidence of small bowel obstruction or ileus or enteritis.  3. There are at least two rim calcified gallstones present. There is no   CT evidence of acute cholecystitis.  4. The liver, pancreas, spleen, and kidneys are normal in appearance.   There is mild enlargement of the left adrenal gland.  5. There is a small hiatal hernia. No acute abnormality of the stomach is   demonstrated.     Dictation Site: 5        Verified By: DAVID A. Martinique, M.D., MD  Nuclear Med:    19-May-14 10:02, GI Blood Loss Study - Nuc Med  GI Blood Loss Study - Nuc Med   REASON FOR EXAM:    rectal bleeding  COMMENTS:   LMP: Post-Menopausal    PROCEDURE: NM  - NM GI BLOOD LOSS STUDY  - Nov 21 2012 10:02AM     RESULT: Comparison: None.    Radiopharmaceutical: 24.798 mCi Tc-29m   Technique: Standard departmental tight red cell scan was performed.   Dynamic, anterior planar images of the abdomen were obtained over 60   minutes.    Findings:   There is activity within the liver and spleen. There is radiotracer     activity conforming to the morphology of the stomach,which is likely   physiologic. Otherwise, no scintigraphic evidence of active extravasation   of blood into the bowel.    IMPRESSION:   No scintigraphic evidence of active  extravasation of blood into the bowel.    Dictation site: 2        Verified By: RGregor Hams M.D., MD   Assessment/Plan:  Assessment/Plan:  Assessment 1) rectal bleeding DDx includes anal outlet bleeding-hemorrhoids, etc, possible ischemic versus infective.  stools collected for c. diff and c+s.   Plan 1) awaiting stool studies as above.  will arrange foe sedated flex sig tomorrow pm unless stool positive for infective organism.  I have discussed the risks benefits and complications of flexible sigmoidoscopy to include not limited to bleeding infection perforation and sedation and she wishes to proceed.   Electronic Signatures: SLoistine Simas(MD)  (Signed 19-May-14 19:09)  Authored: Chief Complaint, VITAL SIGNS/ANCILLARY NOTES, Brief Assessment, Lab Results, Radiology Results, Assessment/Plan   Last Updated: 19-May-14 19:09 by SLoistine Simas(MD)

## 2014-10-26 NOTE — Consult Note (Signed)
Chief Complaint:  Subjective/Chief Complaint seen for rectal bleeding.  denies abdominal pain nausea or repeat rectal bleeding. tolerating clears.   VITAL SIGNS/ANCILLARY NOTES: **Vital Signs.:   22-May-14 08:12  Vital Signs Type Routine  Temperature Temperature (F) 97.6  Celsius 36.4  Pulse Pulse 83  Respirations Respirations 16  Systolic BP Systolic BP 568  Diastolic BP (mmHg) Diastolic BP (mmHg) 54  Mean BP 85  Pulse Ox % Pulse Ox % 94  Pulse Ox Activity Level  At rest  Oxygen Delivery Room Air/ 21 %  *Intake and Output.:   22-May-14 05:34  Stool  loose brown bm   Brief Assessment:  Cardiac Regular   Respiratory clear BS   Gastrointestinal details normal Soft  Nontender  Nondistended  No masses palpable  Bowel sounds normal   Lab Results: Cardiology:  21-May-14 17:57   Ventricular Rate 76  Atrial Rate 76  P-R Interval 164  QRS Duration 80  QT 362  QTc 407  P Axis 61  R Axis 18  T Axis 33  ECG interpretation Normal sinus rhythm Nonspecific ST abnormality Abnormal ECG When compared with ECG of 20-Nov-2012 18:06, ST now depressed in Anterior leads Confirmed by [place], SAM ([id number]) on 11/24/2012 1:42:56 PM  Overreader: Hipolito Bayley  Routine Chem:  19-May-14 21:00   Result Comment STOOL CULTURE - NORMAL INTESTINAL FLORA GREATLY  - REDUCED  Result(s) reported on 24 Nov 2012 at 10:21AM.  21-May-14 04:55   Glucose, Serum  108  BUN  6  Creatinine (comp) 0.81  Sodium, Serum 142  Potassium, Serum 3.8  Chloride, Serum  114  CO2, Serum 23  Calcium (Total), Serum  8.4  Anion Gap  5  Osmolality (calc) 281  eGFR (African American) >60  eGFR (Non-African American) >60 (eGFR values <27m/min/1.73 m2 may be an indication of chronic kidney disease (CKD). Calculated eGFR is useful in patients with stable renal function. The eGFR calculation will not be reliable in acutely ill patients when serum creatinine is changing rapidly. It is not useful in  patients on  dialysis. The eGFR calculation may not be applicable to patients at the low and high extremes of body sizes, pregnant women, and vegetarians.)  Routine Hem:  21-May-14 04:55   WBC (CBC) 10.2  RBC (CBC)  3.56  Hemoglobin (CBC)  11.1  Hematocrit (CBC)  31.9  Platelet Count (CBC) 157  MCV 90  MCH 31.0  MCHC 34.7  RDW 13.9  Neutrophil % 70.4  Lymphocyte % 16.4  Monocyte % 11.3  Eosinophil % 1.6  Basophil % 0.3  Neutrophil #  7.2  Lymphocyte # 1.7  Monocyte #  1.2  Eosinophil # 0.2  Basophil # 0.0 (Result(s) reported on 23 Nov 2012 at 05:38AM.)   Radiology Results: Cardiology:    21-May-14 17:57, ECG  ECG interpretation   Normal sinus rhythm  Nonspecific ST abnormality  Abnormal ECG  When compared with ECG of 20-Nov-2012 18:06,  ST now depressed in Anterior leads  Confirmed by MBrownell SAM ([id number]) on 11/24/2012 1:42:56 PM    Overreader: MHipolito Bayley CT:    21-May-14 15:49, CT Angiography Abdomen/Pelvis W/WO Contrast  CT Angiography Abdomen/Pelvis W/WO Contrast   REASON FOR EXAM:    ischemic colitis  COMMENTS:       PROCEDURE: CT  - CT ANGIOGRAPHY ABD/PEL W/WO  - Nov 23 2012  3:49PM     RESULT: History: Ischemic colitis.    Comparison Study: CT of the abdomen of 11/20/2012.  Findings: Standard CTA obtained. Axial source images and met images   reviewed. The liver unremarkable. No biliary distention. Gallstones.   Spleen unremarkable. Pancreas normal. Stable adrenal prominence   consistent hyperplasia. Renal cyst. No hydronephrosis or hydroureter. No   evidence of bowel obstruction. Loop of thickened wall of the left colon   is less marked on today's examination. No evidence of pneumatosis.     Sliding hiatal hernia. No free air. Abdominal aorta normal in caliber.   Aorta and iliac vessels widely patent. Visceral vessels including renal   arteries patent. The celiac, SMA ,and IMA are patent.    IMPRESSION:  No evidence of bowel ischemia. No obstructing vascular    lesion. Previously identified left colonic wall thickening has improved.        Verified By: Osa Craver, M.D., MD   Assessment/Plan:  Assessment/Plan:  Assessment 1) rectal bleeding, evaluation and biopsies c/w ischemic colitis.  no evidence of vascular lesion on CTA, colon improved.  labs indeterminate for anticardiolipin abs.  2) episode of AF-now nsr. cardiology and IM folowwing. 3) colon polyps, adenomatous.   Plan 1) recommend full colonoscopy in about 4-6 weeks as outpatient to complete evaluation of the colon and check for further polyps.  Recommend repeat anticardiolipin abs in 4 weeks or so, will need GI fu in a month or so to arrange.  advance diet as tolerted to low residue for about a week before return to regular diet.   Electronic Signatures: Loistine Simas (MD)  (Signed 22-May-14 14:02)  Authored: Chief Complaint, VITAL SIGNS/ANCILLARY NOTES, Brief Assessment, Lab Results, Radiology Results, Assessment/Plan   Last Updated: 22-May-14 14:02 by Loistine Simas (MD)

## 2014-10-26 NOTE — Discharge Summary (Signed)
PATIENT NAME:  Carrie [name], WEHNER MR#:  [id number] DATE OF BIRTH:  01/19/1923  DATE OF ADMISSION:  11/21/2012 DATE OF DISCHARGE:  11/25/2012  PRIMARY CARE PHYSICIAN:  Dr. Glenis Smoker.   CONSULTATION:   1.  GI, Dr. Gustavo Lah.   2.  Cardiology, Dr. Nehemiah Massed.  3.  Vascular surgery, Dr. Graylon Good.   DISCHARGE DIAGNOSES:  1.  Rectal bleeding, diverticulosis and possible acute diverticulitis and ischemic colitis.  2.  Atrial fibrillation.  3.  Hypertension.  4.  Severe aortic stenosis.     PROCEDURE:  Sigmoidoscopy.   CONDITION:  Stable.   CODE STATUS:  FULL CODE.   HOME MEDICATIONS:  1.  Norvasc/benazepril 5 mg/10 mg capsule one cap in the morning daily.  2.  Levothyroxine 75 mcg by mouth daily.  3.  Letrozole 2.5 mg by mouth daily.  4.  Pred Forte acetate 1% ophthalmic suspension one drop to each affected eye once a day of the left eye.  5.  Vitamin D3 1000 international units capsule once a day.  6.  Multivitamin with mineral by mouth tablet 1 tab daily twice daily.  7.  Flagyl 500 mg by mouth every 8 hours for seven days. 8.  Cipro 400 mg IV every 12 hours for seven days.   DIET:  Low sodium diet.   ACTIVITY:  As tolerated.   FOLLOW-UP CARE:  Follow up with PCP within 1 to 2 weeks.  Follow up with Dr. Gustavo Lah within one week.   REASON FOR ADMISSION:  Bright red blood per rectal.    HOSPITAL COURSE:  The patient is an 81 year old Caucasian female with no history of GI bleeding, presented to the ED with three episodes of bright blood per rectal.  The patient had a recurrence of her lower gastrointestinal bleeding in the ED, otherwise the patient has no complaints.  For detailed history and physical examination, please refer to the admission note dictated by Dr. Lenore Manner.  On admission date, the patient's EKG showed normal sinus rhythm at 65 beats per minute.  CAT scan of abdomen shows thickening of the wall of the descending colon and the proximal portion of her rectosigmoid area  consistent with colitis or diverticulitis.  The patient's BUN was 33, creatinine 1.4, sodium 141, potassium 5.0.  WBC 13,000, hemoglobin 13.  On admission date, the patient was admitted for rectal bleeding, possible colitis versus diverticulitis.  After admission, the patient's aspirin and Lasix was on hold.  She was treated with Flagyl and Cipro.  Dr. Gustavo Lah suggest a GI bleeding scan which was negative.  Dr. Gustavo Lah did a sigmoidoscopy and biopsy which is consistent with ischemic colitis, however CTA did not confirm ischemic colitis, so the patient was started on full liquid and advanced diet as tolerated.  Dr. Gustavo Lah suggests a full colonoscopy about 4 to 6 weeks as outpatient to complete evaluation of colon and check for further polyps.  The patient's symptoms has much improved.  Has no active bleeding.  Vital signs are stable.   PHYSICAL EXAMINATION:  Unremarkable.   The patient's last hemoglobin was 11.1, which is stable.  The patient was clinically stable, was discharged to home yesterday.  Discussed the patient's discharge plan with the patient and case manager.   TIME SPENT:  About 77 minutes.    ____________________________ Demetrios Loll, MD qc:ea D: 11/26/2012 15:43:35 ET T: 11/27/2012 [id number] ET JOB#: [id number]  cc: Demetrios Loll, MD, <Dictator> Demetrios Loll MD ELECTRONICALLY SIGNED 11/28/2012 14:43

## 2014-10-26 NOTE — Consult Note (Signed)
Chief Complaint:  Subjective/Chief Complaint seen for rectal bleeding.  not recurrent.  Biopsies from flex sig preliminarily c/w ischemic colitis.  mild abdominal discomfort, tolerating clears.   VITAL SIGNS/ANCILLARY NOTES: **Vital Signs.:   21-May-14 07:59  Vital Signs Type Routine  Temperature Temperature (F) 98.9  Celsius 37.1  Temperature Source oral  Pulse Pulse 63  Respirations Respirations 20  Systolic BP Systolic BP 096  Diastolic BP (mmHg) Diastolic BP (mmHg) 77  Mean BP 95  Pulse Ox % Pulse Ox % 97  Pulse Ox Activity Level  At rest  Oxygen Delivery Room Air/ 21 %  *Intake and Output.:   21-May-14 05:06  Stool  small loose bm   Brief Assessment:  Cardiac Regular   Respiratory normal resp effort  clear BS   Gastrointestinal details normal Soft  Nondistended  No masses palpable  Bowel sounds normal  minimal abdominal discomfort lower.   Lab Results: Pathology:  20-May-14 00:12   Pathology Report ========== TEST NAME ==========  ========= RESULTS =========  = REFERENCE RANGE =  PATHOLOGY REPORT  Pathology Report .                               [   Final Report         ]                   Material submitted:         Marland Kitchen PART A: COLITIS DESCENDING COLON COLD BIOPSY *STAT* PART B: DESCENDING COLON POLYP COLD SNARE *STAT* PART C: SIGMOID COLON COLITIS COLD BIOPSIES *STAT* PART D: RECTOSIGMOID COLON COLD BIOPSIES *STAT* PART E: DISTAL SIGMOID COLON POLYPCOLD SNARE *STAT* .                               [   Final Report         ]                   Pre-operative diagnosis:                                        . RECTAL BLEEDING, FLEXIBLE SIGMOIDOSCOPY .                               [   Final Report         ]                   ********************************************************************** Diagnosis: Part A: DESCENDING COLON COLD BIOPSY: - ACTIVE COLITIS WITH FEATURES CONSISTENT WITH ISCHEMIC COLITIS. Marland Kitchen Part B: DESCENDING COLON POLYP COLD SNARE: - TUBULAR  ADENOMA, 2 FRAGMENTS. - NEGATIVE FOR HIGH GRADE DYSPLASIA AND MALIGNANCY. . Part C: SIGMOID COLON COLITIS COLD BIOPSIES: - COLONIC MUCOSA WITH INTACT CRYPTS, LAMINA PROPRIA EDEMA AND HEMORRHAGE, AND FLATTENED SURFACE EPITHELIUM. - NO DEFINITE ACTIVE COLITIS. Marland Kitchen Part D: RECTOSIGMOID COLON COLD BIOPSIES: - COLONIC MUCOSA WITH INTACT CRYPTS, LAMINA PROPRIA EDEMA, AND FOCALLY FLATTENED SURFACE EPITHELIUM. - NO DEFINITE ACTIVE COLITIS. Marland Kitchen Part E: DISTAL SIGMOID COLON POLYP COLD SNARE: - TUBULAR ADENOMA, 2 FRAGMENTS. - NEGATIVE FOR HIGH GRADE DYSPLASIA AND MALIGNANCY. . NOTE: In part A from the descending colon, 1 of 3 fragments shows  microcrypts, eosinophilic lamina propria changes, and surface epithelial detachment, consistent with ischemic colitis. The other fragments show edema and hemorrhage. The sigmoid colon and rectosigmoid colon show nonspecific changes; mild ischemia at these sites is not ruled out. No infectious agents or granulomas areseen, and there are no features of collagenous or lymphocytic colitis. . The report was called to [facility] in Dr. Marton Redwood office at 10:15 AM on Nov 23, 2012. Read-back was performed. . MSO/11/23/2012 ********************************************************************** .                               [   Final Report         ]                   Electronically signed:                                     Marland Kitchen Vivia Ewing, MD, Pathologist .                               [   Final Report      ]                   Gross description:                                         . A. Received in a formalin filled container labeled [name] and colitis ascending colon cold biopsies are three tan pink soft tissue fragments ranging from <0.1 to 0.3 cm in greatest dimensions. The smallest fragment will likely not survive processing. The specimen is entirely submitted in cassette 1. . B. Received in a formalin filled container labeled [name] and  polyp descending colon cold snare are two tan pink soft tissue fragments each 0.3 cm in greatest dimensions. Entirely submitted in cassette 1. . C. Received in a formalin filled container labeled [name] and sigmoid colon colitis cold biopsies is a 0.4 cm tan pink soft tissue fragment, entirely submitted in cassette 1. . D. Received in a formalin filled container labeled [name] and rectosigmoid colon cold biopsies are two tan pink soft tissue fragments 0.3 and 0.4 cm in greatest dimensions. Entirely submitted in cassette 1. . E. Received in a formalin filled container labeled [name] and distal sigmoid colon polyp cold snare are two tan pink soft tissue fragments 0.3 and 0.4 cm in greatest dimensions. Entirely submitted in cassette 1. QAC/KCT .                               [   Final Report         ]                   Pathologist provided ICD-9: 211.3, 557.0 .                               [   Final Report         ]  CPT       . [id number], [id number], [id number], [phone], [phone]               [facility]            No: [id number]           [address], [place], [place] [address]           [place], [place]             [id number]   Result(s) reported on 23 Nov 2012 at 12:21PM.  Routine Micro:  19-May-14 20:00   Micro Text Report CLOS.DIFF ASSAY, RT-PCR   COMMENT                   NEGATIVE-CLOS.DIFFICILE TOXIN NOT DETECTED BY PCR   ANTIBIOTIC                         21:00   Culture Comment . NO PATHOGENIC E.COLI DETECTED  Routine Chem:  19-May-14 21:00   Result Comment STOOL CULTURE - NORMAL INTESTINAL FLORA GREATLY  - REDUCED  Result(s) reported on 23 Nov 2012 at 11:26AM.  21-May-14 04:55   Glucose, Serum  108  BUN  6  Creatinine (comp) 0.81  Sodium, Serum 142  Potassium, Serum 3.8  Chloride, Serum  114  Calcium (Total), Serum  8.4  Anion Gap  5  Osmolality (calc) 281  eGFR (African  American) >60  eGFR (Non-African American) >60 (eGFR values <27m/min/1.73 m2 may be an indication of chronic kidney disease (CKD). Calculated eGFR is useful in patients with stable renal function. The eGFR calculation will not be reliable in acutely ill patients when serum creatinine is changing rapidly. It is not useful in  patients on dialysis. The eGFR calculation may not be applicable to patients at the low and high extremes of body sizes, pregnant women, and vegetarians.)  Routine Hem:  21-May-14 04:55   WBC (CBC) 10.2  RBC (CBC)  3.56  Hemoglobin (CBC)  11.1  Hematocrit (CBC)  31.9  Platelet Count (CBC) 157  MCV 90  MCH 31.0  MCHC 34.7  RDW 13.9  Neutrophil % 70.4  Lymphocyte % 16.4  Monocyte % 11.3  Eosinophil % 1.6  Basophil % 0.3  Neutrophil #  7.2  Lymphocyte # 1.7  Monocyte #  1.2  Eosinophil # 0.2  Basophil # 0.0 (Result(s) reported on 23 Nov 2012 at 05:38AM.)   Assessment/Plan:  Assessment/Plan:  Assessment 1) rectal bleeding.  colon bxs c/w ischemic colitis.  now with AF.  Possibility of embolus related to this issue. 2) adenomatous colon polyps on flex sig.   Plan 1) recommend CTA to evaluate for mesenteric insufficiency. 2) needs complete colonoscopy when clinically feasible as o/p once current situation better.  discussed with Dr SManuella Ghazi   Electronic Signatures: SLoistine Simas(MD)  (Signed 21-May-14 13:57)  Authored: Chief Complaint, VITAL SIGNS/ANCILLARY NOTES, Brief Assessment, Lab Results, Assessment/Plan   Last Updated: 21-May-14 13:57 by SLoistine Simas(MD)

## 2014-10-26 NOTE — H&P (Signed)
PATIENT NAME:  Carrie [name], Carrie [name] MR#:  [id number] DATE OF BIRTH:  09-23-1922  DATE OF ADMISSION:  05/06/2013  PRIMARY CARE PHYSICIAN: Dr. Glenis Smoker.   REFERRING PHYSICIAN: Dr. Lavonia Drafts.   CHIEF COMPLAINT: Bright red blood per rectum.   HISTORY OF PRESENT ILLNESS: Carrie [name] is an 81 year old pleasant white female with a past medical history of hypertension, hypothyroidism, presented to the Emergency Department with the complaints of bright red blood per rectum, started since this afternoon. This afternoon had an urge to go, tried it,  had a large amount of bright red blood per rectum mixed with stools. Called her primary care physician, who recommended her to go to the Emergency Department if the patient has further episodes. About 2 hours later, the patient had another small episode of bright red blood per rectum. Concerning this, came to the Emergency Department.  Work-up in the Emergency Department, the patient is found to have a hemoglobin of 12 which is at her baseline. The patient had another episode of bloody stool in the Emergency Department, Concerning this, the decision is made to observe the patient in the Emergency Department. The patient had a recent colonoscopy in June 2014 and was found to have diverticulosis. The patient denies having any abdominal pain, nausea, vomiting.   PAST MEDICAL HISTORY: 1.  Hypertension.  2.  Hypothyroidism.  3.  Aortic stenosis.   PAST SURGICAL HISTORY: 1.  Cataract extraction.  2.  Thyroidectomy.  3.  Appendectomy.  4.  Hysterectomy.   ALLERGIES:  AMOXICILLIN.    HOME MEDICATIONS: 1.  Vitamin D 3000 mg once a day.  2.  Prednisone one drop each affected eye. 3.  Ocuvite 1 tablet 2 times a day.  4.  Levothyroxine 75 mcg once a day.  5.  Letrozole 2.5 mg once a day.  6.  Amlodipine/benazepril 1 capsule once a day.   SOCIAL HISTORY: Smoked remotely, quit in 23s; had smoked about 2 packs a day. Denies drinking alcohol or using illicit drugs.  The patient still works, independent of activities of daily living and IADLs.   FAMILY HISTORY: Two of her sisters with breast cancer.   REVIEW OF SYSTEMS:   CONSTITUTIONAL: Denies confusion or denies having any generalized weakness.  EYES: No change in vision.  ENT: No change in hearing.  RESPIRATORY: No cough, shortness of breath.  CARDIOVASCULAR: No chest pain, palpitations.  GASTROINTESTINAL: No nausea, vomiting, has bright red blood per rectum.  GENITOURINARY: No dysuria or hematuria.  ENDOCRINE: No polyuria or polydipsia, has history of hypothyroidism.  HEMATOLOGIC: No easy bruising or bleeding.  SKIN: No rash or lesions.  MUSCULOSKELETAL: No joint pains and aches.  NEUROLOGIC: No weakness or numbness in any part of the body.   PHYSICAL EXAMINATION: GENERAL: This is a well-built, well-nourished, age-appropriate female lying down in the bed, not in distress.  VITAL SIGNS: Temperature 98, pulse 63, blood pressure 147/66, respiratory rate of 18, oxygen saturation is 98% on room air.  HEENT: Head normocephalic, atraumatic. Eyes: No sclerae icterus. Conjunctivae normal. Pupils equal and react to light. Extraocular movements are intact. Mucous membranes moist. No pharyngeal erythema.  NECK: Supple. No lymphadenopathy. No JVD. No carotid bruit.  CHEST: There is no focal tenderness.  LUNGS: Bilaterally clear to auscultation.   HEART: S1 and S2. Regular, has systolic murmur heard radiating to the carotids. No pedal edema. Pulses 2+.  ABDOMEN: Bowel sounds present. Soft. Has bilateral renal bruit, left more than the right, nontender, nondistended. No hepatosplenomegaly.  SKIN: No rash or lesions.  MUSCULOSKELETAL: Good range of motion in all the extremities.  NEUROLOGIC: The patient is alert, oriented to place, person and time. Cranial nerves II through XII intact. Motor 5/5 in upper and lower extremities.   LABORATORY DATA: Urinalysis 1+ leukocyte esterase, bacteria 16.  Coag profile is  well within normal limits.   CMP: Is completely within normal limits.   Lipase 123. CBC: WBC of 15.9, hemoglobin 13.1, platelet count of 237; rest of all the values are within normal limits.    ASSESSMENT AND PLAN: Carrie [name] is an 81 year old female who comes to the Emergency Department with bright red blood per rectum, painless bleeding.  1.  Diverticular bleeding. Continue following up CBC every 8 hours If hemoglobin is less than 9,  we will transfuse blood. Continue with IV fluids. Keep the patient n.p.o. except for sips and drips. Once hemoglobin stable, will start on diet and advance the diet.  2.  Hypertension. Continue with home medications.  3.  Hypothyroidism. Continue with home dose of the Synthroid.  4.  Keep the patient on deep vein thrombosis prophylaxis with sequential compression devices.   TIME SPENT: 40 minutes.    ____________________________ Monica Becton, MD pv:NTS D: 05/06/2013 23:51:08 ET T: 05/07/2013 00:27:07 ET JOB#: [id number]  cc: Monica Becton, MD, <Dictator> Catheryn Bacon, CNM [facility] Jayme Cham MD ELECTRONICALLY SIGNED 06/03/2013 20:54

## 2014-10-26 NOTE — Consult Note (Signed)
Brief Consult Note: Diagnosis: ischemic colitis.   Patient was seen by consultant.   Comments: I will review the CT if no macro vascualr lesions are noted then no indication for vascular intervention or surgery.  Electronic Signatures: Hortencia Pilar (MD)  (Signed 21-May-14 18:09)  Authored: Brief Consult Note   Last Updated: 21-May-14 18:09 by Hortencia Pilar (MD)

## 2014-10-29 DIAGNOSIS — L700 Acne vulgaris: Secondary | ICD-10-CM | POA: Diagnosis not present

## 2014-10-29 DIAGNOSIS — D489 Neoplasm of uncertain behavior, unspecified: Secondary | ICD-10-CM | POA: Diagnosis not present

## 2014-10-29 DIAGNOSIS — L821 Other seborrheic keratosis: Secondary | ICD-10-CM | POA: Diagnosis not present

## 2014-11-13 ENCOUNTER — Other Ambulatory Visit: Payer: Self-pay

## 2014-11-13 ENCOUNTER — Ambulatory Visit
Admission: RE | Admit: 2014-11-13 | Discharge: 2014-11-13 | Disposition: A | Discharge status: Home / Self Care | Payer: Medicare Other | Source: Ambulatory Visit | Attending: Oncology | Admitting: Oncology

## 2014-11-13 DIAGNOSIS — M8580 Other specified disorders of bone density and structure, unspecified site: Secondary | ICD-10-CM | POA: Diagnosis not present

## 2014-11-13 DIAGNOSIS — Z780 Asymptomatic menopausal state: Secondary | ICD-10-CM | POA: Diagnosis not present

## 2014-11-13 DIAGNOSIS — M8589 Other specified disorders of bone density and structure, multiple sites: Secondary | ICD-10-CM | POA: Diagnosis not present

## 2014-11-13 DIAGNOSIS — Z8739 Personal history of other diseases of the musculoskeletal system and connective tissue: Secondary | ICD-10-CM | POA: Diagnosis present

## 2014-11-13 DIAGNOSIS — M810 Age-related osteoporosis without current pathological fracture: Secondary | ICD-10-CM

## 2014-11-13 HISTORY — DX: Malignant neoplasm of unspecified site of unspecified female breast: C50.919

## 2014-12-06 ENCOUNTER — Other Ambulatory Visit: Payer: Self-pay | Admitting: Oncology

## 2014-12-06 DIAGNOSIS — M8580 Other specified disorders of bone density and structure, unspecified site: Secondary | ICD-10-CM | POA: Insufficient documentation

## 2015-03-05 ENCOUNTER — Other Ambulatory Visit: Payer: Self-pay | Admitting: Oncology

## 2015-03-05 ENCOUNTER — Ambulatory Visit
Admission: RE | Admit: 2015-03-05 | Discharge: 2015-03-05 | Disposition: A | Discharge status: Home / Self Care | Payer: Medicare Other | Source: Ambulatory Visit | Attending: Oncology | Admitting: Oncology

## 2015-03-05 ENCOUNTER — Ambulatory Visit: Payer: Self-pay

## 2015-03-05 DIAGNOSIS — Z9011 Acquired absence of right breast and nipple: Secondary | ICD-10-CM | POA: Diagnosis not present

## 2015-03-05 DIAGNOSIS — Z1231 Encounter for screening mammogram for malignant neoplasm of breast: Secondary | ICD-10-CM | POA: Diagnosis not present

## 2015-03-05 DIAGNOSIS — Z853 Personal history of malignant neoplasm of breast: Secondary | ICD-10-CM | POA: Insufficient documentation

## 2015-04-24 ENCOUNTER — Inpatient Hospital Stay: Payer: Medicare Other | Attending: Oncology | Admitting: Oncology

## 2015-04-24 VITALS — BP 101/60 | HR 64 | Temp 96.9°F | Resp 16 | Ht 64.0 in | Wt 120.3 lb

## 2015-04-24 DIAGNOSIS — Z79811 Long term (current) use of aromatase inhibitors: Secondary | ICD-10-CM

## 2015-04-24 DIAGNOSIS — M8580 Other specified disorders of bone density and structure, unspecified site: Secondary | ICD-10-CM

## 2015-04-24 DIAGNOSIS — C50911 Malignant neoplasm of unspecified site of right female breast: Secondary | ICD-10-CM | POA: Diagnosis not present

## 2015-04-24 DIAGNOSIS — Z9011 Acquired absence of right breast and nipple: Secondary | ICD-10-CM

## 2015-04-24 DIAGNOSIS — Z79899 Other long term (current) drug therapy: Secondary | ICD-10-CM | POA: Diagnosis not present

## 2015-04-24 DIAGNOSIS — Z170 Estrogen receptor positive status [ER+]: Secondary | ICD-10-CM

## 2015-05-08 NOTE — Progress Notes (Signed)
[place]  Telephone:([phone]) [phone] Fax:(336) [phone]  ID: Carrie [name] OB: June 08, 1923  MR#: [id number]  [id number]  No care team member to display  CHIEF COMPLAINT:  Chief Complaint  Patient presents with  . Follow-up    scan follow up    INTERVAL HISTORY: Patient returns to clinic today for routine six-month evaluation.  She continues to feel well and is asymptomatic. She is tolerating her letrozole without significant side effects. She has no neurologic complaints.  She denies any chest pain or shortness of breath.  She has a good appetite and has maintained her weight.  She has no nausea, vomiting, constipation, or diarrhea.  She has no urinary complaints. Patient offers no specific complaints today.   REVIEW OF SYSTEMS:   Review of Systems  Constitutional: Negative.   Respiratory: Negative.   Cardiovascular: Negative.   Gastrointestinal: Negative.   Musculoskeletal: Negative.   Neurological: Negative.     As per HPI. Otherwise, a complete review of systems is negatve.  PAST MEDICAL HISTORY: Past Medical History  Diagnosis Date  . Breast cancer 2012    right    PAST SURGICAL HISTORY: Past Surgical History  Procedure Laterality Date  . Mastectomy Right 2012    FAMILY HISTORY Family History  Problem Relation Age of Onset  . Breast cancer Sister        ADVANCED DIRECTIVES:    HEALTH MAINTENANCE: Social History  Substance Use Topics  . Smoking status: Not on file  . Smokeless tobacco: Not on file  . Alcohol Use: Not on file     Colonoscopy:  PAP:  Bone density:  Lipid panel:  Allergies  Allergen Reactions  . Amoxicillin Rash    Current Outpatient Prescriptions  Medication Sig Dispense Refill  . amLODipine-benazepril (LOTREL) 5-10 MG capsule Take 1 capsule by mouth.    Marland Kitchen aspirin EC 81 MG tablet Take 81 mg by mouth.    . brimonidine (ALPHAGAN P) 0.1 % SOLN 1 drop.    . brimonidine (ALPHAGAN) 0.15 % ophthalmic  solution 1 drop.    . Cholecalciferol (VITAMIN D3) 1000 UNITS CAPS Take 1 capsule by mouth.    . furosemide (LASIX) 20 MG tablet Take 20 mg by mouth.    . letrozole (FEMARA) 2.5 MG tablet Take 2.5 mg by mouth.    . levothyroxine (SYNTHROID, LEVOTHROID) 75 MCG tablet     . Omega-3 1000 MG CAPS Take 2 capsules by mouth once a day.    . prednisoLONE acetate (PRED FORTE) 1 % ophthalmic suspension      No current facility-administered medications for this visit.    OBJECTIVE: Filed Vitals:   04/24/15 1452  BP: 101/60  Pulse: 64  Temp: 96.9 F (36.1 C)  Resp: 16     Body mass index is 20.63 kg/(m^2).    ECOG FS:0 - Asymptomatic  General: Well-developed, well-nourished, no acute distress. Eyes: Pink conjunctiva, anicteric sclera. Breasts: Right chest wall without evidence of recurrence. Left breast and axilla without lumps or masses. Lungs: Clear to auscultation bilaterally. Heart: Regular rate and rhythm. No rubs, murmurs, or gallops. Abdomen: Soft, nontender, nondistended. No organomegaly noted, normoactive bowel sounds. Musculoskeletal: No edema, cyanosis, or clubbing. Neuro: Alert, answering all questions appropriately. Cranial nerves grossly intact. Skin: No rashes or petechiae noted. Psych: Normal affect.   LAB RESULTS:  Lab Results  Component Value Date   NA 141 05/08/2013   K 3.9 05/08/2013   CL 112* 05/08/2013   CO2 26 05/08/2013  GLUCOSE 94 05/08/2013   BUN 9 05/08/2013   CREATININE 0.94 05/08/2013   CALCIUM 8.3* 05/08/2013   PROT 6.9 05/06/2013   ALBUMIN 3.5 05/06/2013   AST 25 05/06/2013   ALT 17 05/06/2013   ALKPHOS 89 05/06/2013   BILITOT 0.2 05/06/2013   GFRNONAA 55* 05/08/2013   GFRAA >60 05/08/2013    Lab Results  Component Value Date   WBC 9.0 05/08/2013   NEUTROABS 6.3 05/08/2013   HGB 10.7* 05/08/2013   HCT 31.2* 05/08/2013   MCV 88 05/08/2013   PLT 215 05/08/2013     STUDIES: No results found.  ASSESSMENT: Stage Ia ER/PR  adenocarcinoma breast.  PLAN:    1.  Breast cancer: No evidence of disease.  Continue letrozole 2.5 mg daily, completing 5 years of treatment in April 2017.  Patient's most recent left breast mammogram on March 05, 2015 was reported as BI-RADS 1, repeat in August 2017.  Patient will return to clinic in 6 months for routine evaluation.   2. Osteopenia: Bone mineral density on Dec 03, 2014 revealed a T score of -2.3, which is unchanged from previous. Continue calcium and vitamin D.  Patient expressed understanding and was in agreement with this plan. She also understands that She can call clinic at any time with any questions, concerns, or complaints.    Lloyd Huger, MD   05/08/2015 4:14 PM

## 2015-08-28 IMAGING — CT CT ABD-PELV W/O CM
1 of 2 series · 14 of 32 positions shown, 18 images · non-contrast
Comparison: none

REASON FOR EXAM: (1) abdominal pain; (2) abdominal pain;    NOTE: Nursing
to Give Oral CT Contras
COMMENTS:   May transport without cardiac monitor

[Series 2: soft tissue · axial · 0.64mm/px · z∈[-846,-492]mm · 14 of 135 slices shown, 18 images]
[im 11/135  soft-tissue]
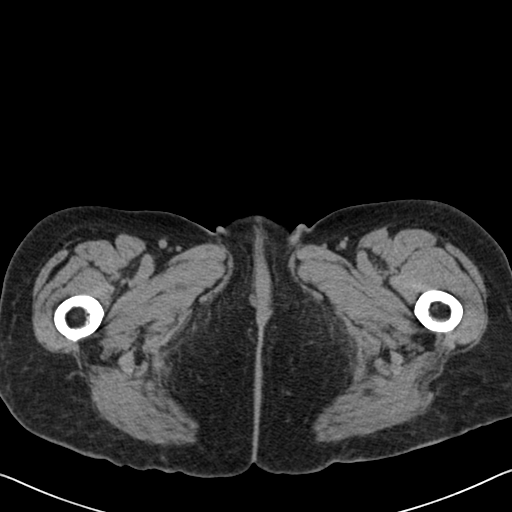
[im 11/135  bone]
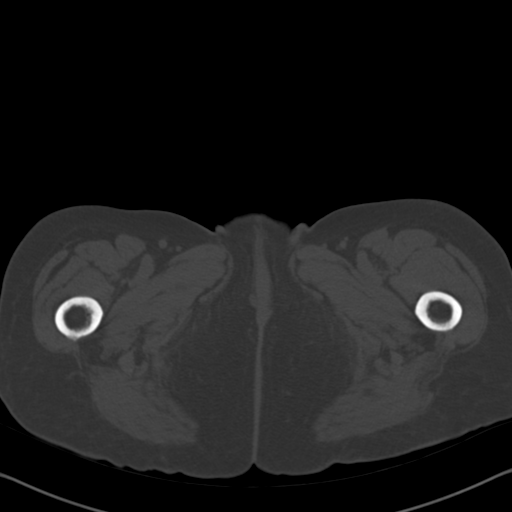
[im 22/135  soft-tissue]
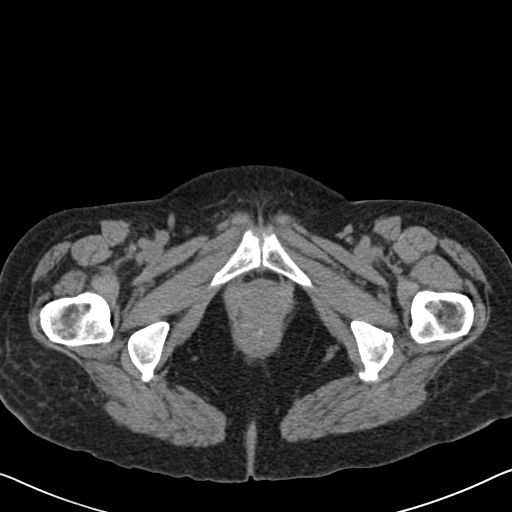
[im 33/135  soft-tissue]
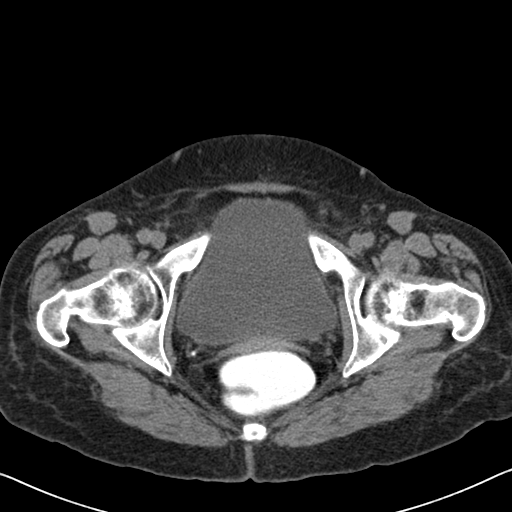
[im 43/135  soft-tissue]
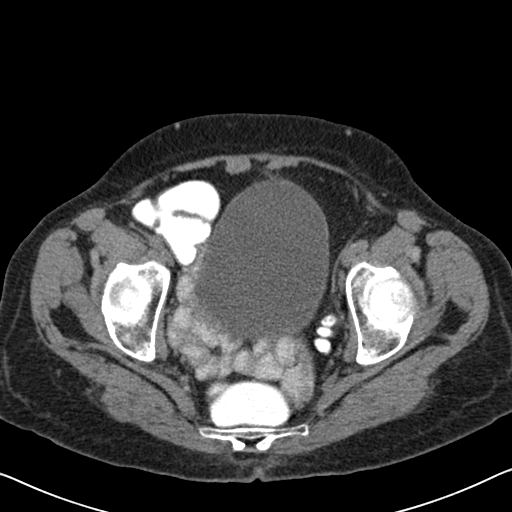
[im 54/135  soft-tissue]
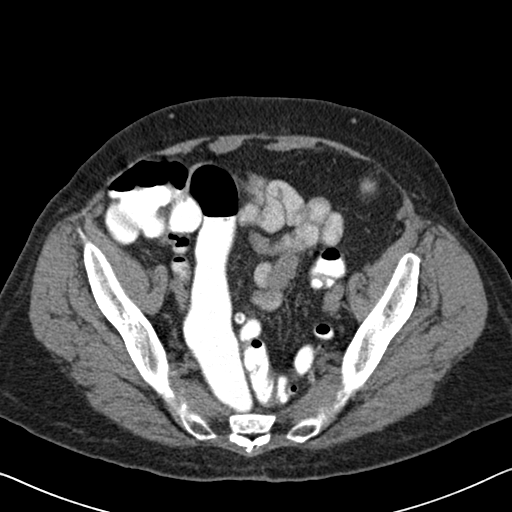
[im 65/135  soft-tissue]
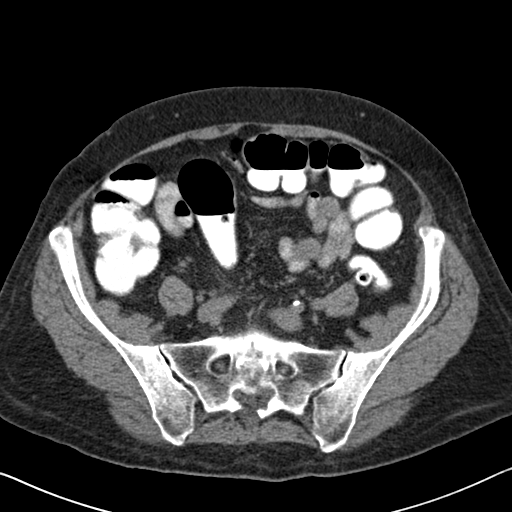
[im 76/135  soft-tissue]
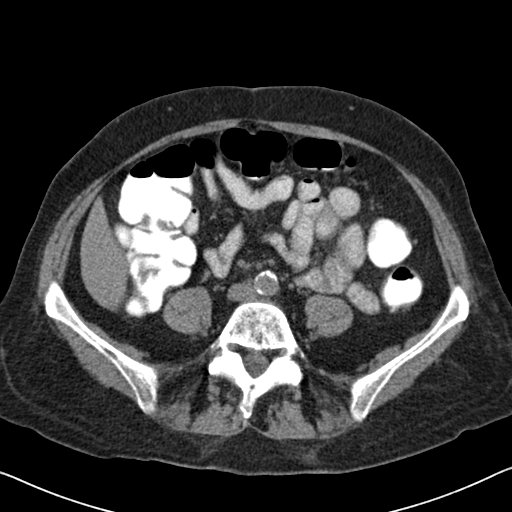
[im 86/135  soft-tissue]
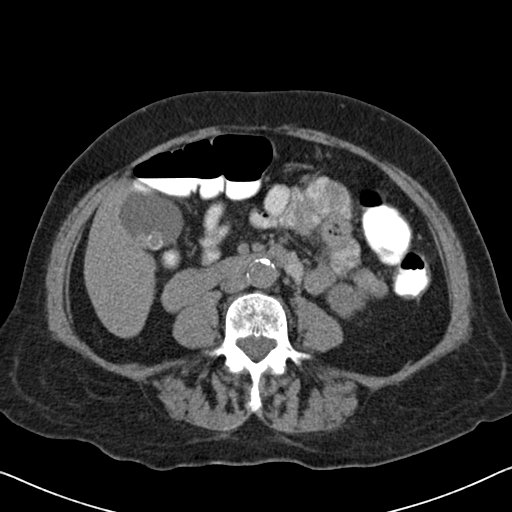
[im 97/135  soft-tissue]
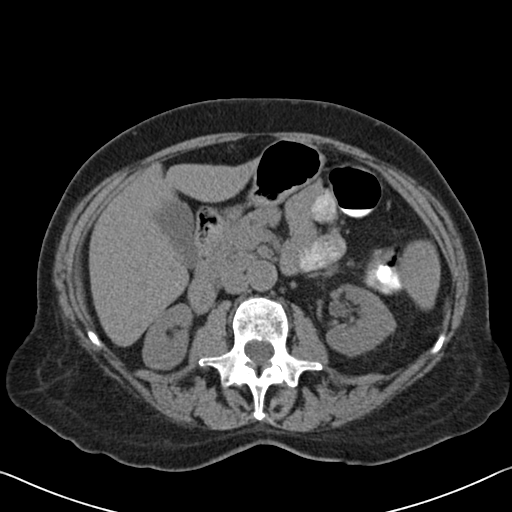
[im 97/135  bone]
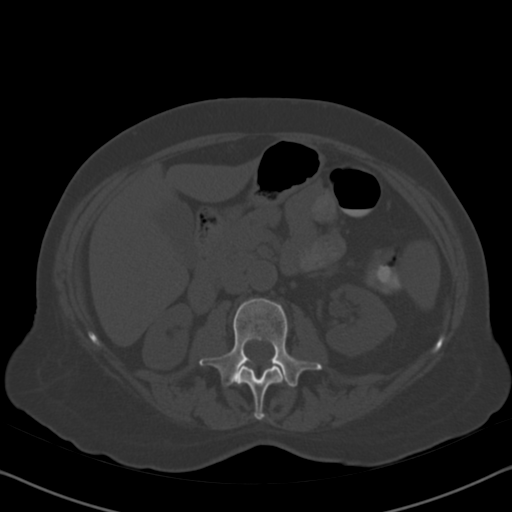
[im 108/135  soft-tissue]
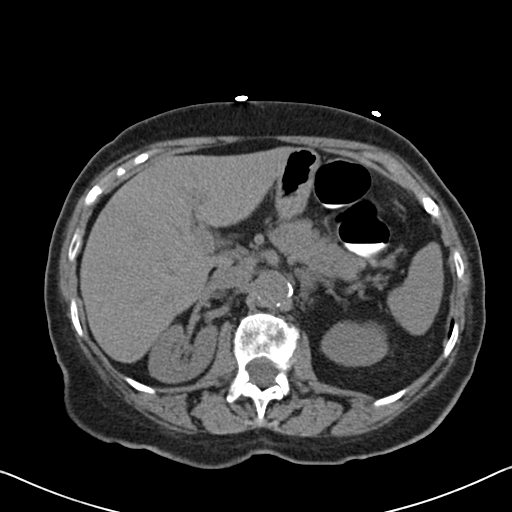
[im 113/135  lung]
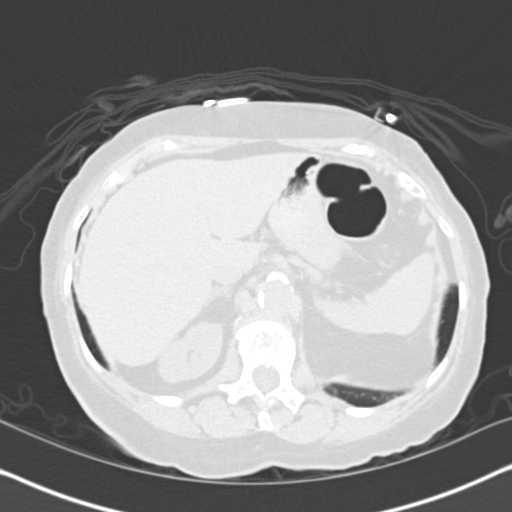
[im 118/135  soft-tissue]
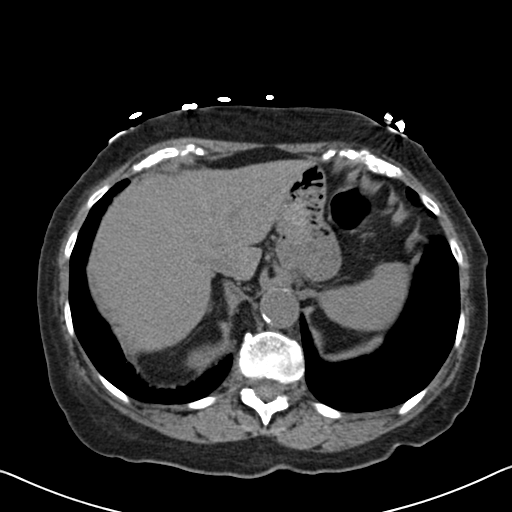
[im 118/135  lung]
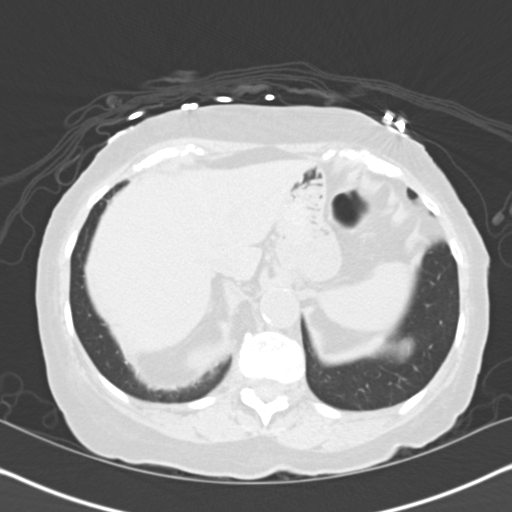
[im 124/135  lung]
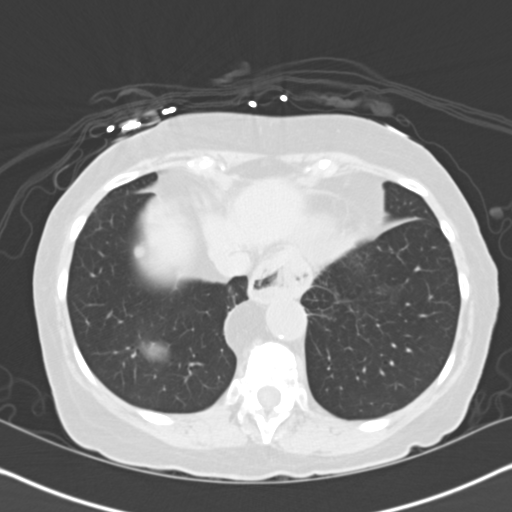
[im 129/135  soft-tissue]
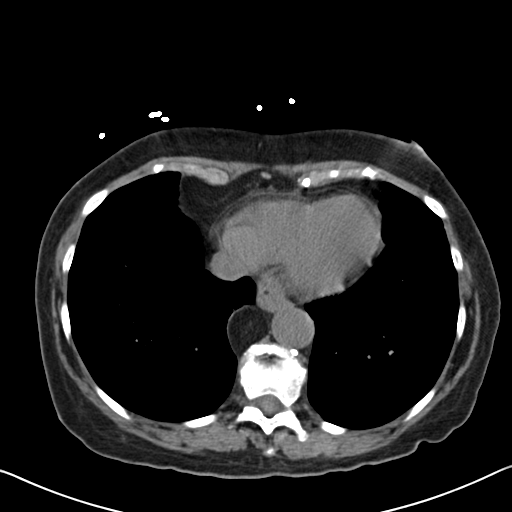
[im 129/135  lung]
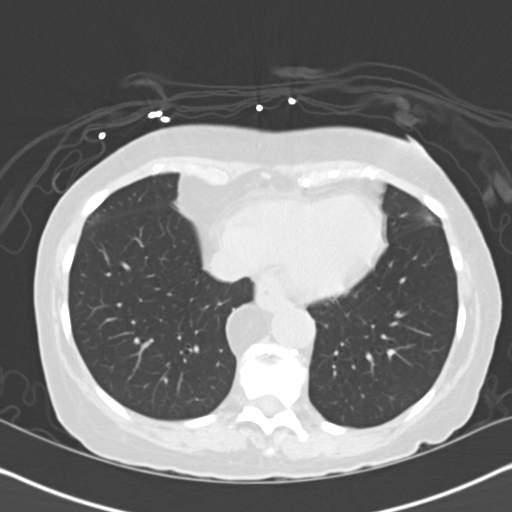

[14 of 32 positions shown; findings below may reference images not displayed]

PROCEDURE:     CT  - CT ABDOMEN AND PELVIS W[DATE]  [DATE]

RESULT:     Noncontrast CT of the abdomen and pelvis is performed in the
standard fashion with images reconstructed at 3 mm slice thickness in the
axial plane. Comparison is made to the previous examination dated 11/20/2012.

The patient also received oral contrast for the examination. There is oral
contrast throughout the colon with limited contrast in the small bowel. The
lung bases appear clear with no infiltrate, edema, effusion, mass or
pneumothorax. Cholelithiasis is demonstrated with at least 2 stones in the
gallbladder. The liver appears to be unremarkable. There is a moderate sized
hiatal hernia. The spleen is within normal limits. There is minimal punctate
calcification on image 35 and image 36 which could be in the area of the
neck of the gallbladder. Consider right upper quadrant limited abdominal
ultrasound for further assessment. This could certainly represent vascular
calcification. This punctate area was present previously. The pancreas shows
no discrete mass on this noncontrast exam. There is some punctate
calcification on image 26. There is fullness in the left adrenal gland
diffusely. The right adrenal appears normal. The aorta is normal in caliber.
There is no ascites. The patient is status post hysterectomy. No adnexal
masses appreciated. There is no enteric or colonic bowel wall thickening or
abnormal distention. No inflammatory stranding is appreciated. The bony
structures appear to be grossly unremarkable.
IMPRESSION: 1. Cholelithiasis. Punctate calcification in the area of the gallbladder
neck/cystic duct. Correlate clinically. No definite CT evidence of acute
cholecystitis. Correlate with clinical and laboratory data. Followup with
limited right upper quadrant ultrasound may be beneficial if the patient is
symptomatic in this region.
2. Minimal scattered colonic diverticulosis without findings of acute
diverticulitis.
3. Moderate amount of urine in the bladder.
4. Small to moderate sized hiatal hernia.

[REDACTED]

## 2015-10-23 ENCOUNTER — Inpatient Hospital Stay: Payer: Medicare Other | Attending: Oncology | Admitting: Oncology

## 2015-10-23 DIAGNOSIS — Z9011 Acquired absence of right breast and nipple: Secondary | ICD-10-CM | POA: Insufficient documentation

## 2015-10-23 DIAGNOSIS — Z79899 Other long term (current) drug therapy: Secondary | ICD-10-CM | POA: Diagnosis not present

## 2015-10-23 DIAGNOSIS — Z170 Estrogen receptor positive status [ER+]: Secondary | ICD-10-CM | POA: Diagnosis not present

## 2015-10-23 DIAGNOSIS — C50911 Malignant neoplasm of unspecified site of right female breast: Secondary | ICD-10-CM | POA: Diagnosis present

## 2015-10-23 DIAGNOSIS — M8580 Other specified disorders of bone density and structure, unspecified site: Secondary | ICD-10-CM | POA: Insufficient documentation

## 2015-10-23 DIAGNOSIS — Z79811 Long term (current) use of aromatase inhibitors: Secondary | ICD-10-CM | POA: Insufficient documentation

## 2015-10-23 NOTE — Progress Notes (Signed)
Patient does not offer any problems today.  

## 2015-10-24 NOTE — Progress Notes (Signed)
[place]  [phone] [phone]  ID: Carrie [name] OB: 13-Nov-1922  MR#: [id number]  [id number]  Patient Care Team: Catheryn Bacon, CNM as PCP - General (Obstetrics and Gynecology)  CHIEF COMPLAINT:  Chief Complaint  Patient presents with  . Breast Cancer    INTERVAL HISTORY: Patient returns to clinic today for routine six-month evaluation.  She continues to feel well and is asymptomatic. She is tolerating her letrozole without significant side effects. She has no neurologic complaints.  She denies any chest pain or shortness of breath.  She has a good appetite and has maintained her weight.  She has no nausea, vomiting, constipation, or diarrhea.  She has no urinary complaints. Patient offers no specific complaints today.   REVIEW OF SYSTEMS:   Review of Systems  Constitutional: Negative.  Negative for fever and weight loss.  Respiratory: Negative.  Negative for shortness of breath.   Cardiovascular: Negative.  Negative for chest pain.  Gastrointestinal: Negative.   Genitourinary: Negative.   Musculoskeletal: Negative.   Neurological: Negative.  Negative for weakness.    As per HPI. Otherwise, a complete review of systems is negatve.  PAST MEDICAL HISTORY: Past Medical History  Diagnosis Date  . Breast cancer 2012    right    PAST SURGICAL HISTORY: Past Surgical History  Procedure Laterality Date  . Mastectomy Right 2012    FAMILY HISTORY Family History  Problem Relation Age of Onset  . Breast cancer Sister        ADVANCED DIRECTIVES:    HEALTH MAINTENANCE: Social History  Substance Use Topics  . Smoking status: Not on file  . Smokeless tobacco: Not on file  . Alcohol Use: Not on file     Colonoscopy:  PAP:  Bone density:  Lipid panel:  Allergies  Allergen Reactions  . Other Other (See Comments)    "eye drop, unsure of name that caused  severe burning"  . Amoxicillin Rash    Current Outpatient  Prescriptions  Medication Sig Dispense Refill  . amLODipine-benazepril (LOTREL) 5-10 MG capsule Take 1 capsule by mouth.    Marland Kitchen aspirin EC 81 MG tablet Take 81 mg by mouth.    . brimonidine (ALPHAGAN P) 0.1 % SOLN 1 drop.    . brimonidine (ALPHAGAN) 0.15 % ophthalmic solution 1 drop.    . Cholecalciferol (VITAMIN D3) 1000 UNITS CAPS Take 1 capsule by mouth.    . furosemide (LASIX) 20 MG tablet Take 20 mg by mouth.    . letrozole (FEMARA) 2.5 MG tablet Take 2.5 mg by mouth.    . levothyroxine (SYNTHROID, LEVOTHROID) 75 MCG tablet     . Omega-3 1000 MG CAPS Take 2 capsules by mouth once a day.    . prednisoLONE acetate (PRED FORTE) 1 % ophthalmic suspension      No current facility-administered medications for this visit.    OBJECTIVE: There were no vitals filed for this visit.   There is no weight on file to calculate BMI.    ECOG FS:0 - Asymptomatic  General: Well-developed, well-nourished, no acute distress. Eyes: Pink conjunctiva, anicteric sclera. Breasts: Right chest wall without evidence of recurrence. Left breast and axilla without lumps or masses. Lungs: Clear to auscultation bilaterally. Heart: Regular rate and rhythm. No rubs, murmurs, or gallops. Abdomen: Soft, nontender, nondistended. No organomegaly noted, normoactive bowel sounds. Musculoskeletal: No edema, cyanosis, or clubbing. Neuro: Alert, answering all questions appropriately. Cranial nerves grossly intact. Skin: No rashes or petechiae noted. Psych: Normal  affect.   LAB RESULTS:  Lab Results  Component Value Date   NA 141 05/08/2013   K 3.9 05/08/2013   CL 112* 05/08/2013   CO2 26 05/08/2013   GLUCOSE 94 05/08/2013   BUN 9 05/08/2013   CREATININE 0.94 05/08/2013   CALCIUM 8.3* 05/08/2013   PROT 6.9 05/06/2013   ALBUMIN 3.5 05/06/2013   AST 25 05/06/2013   ALT 17 05/06/2013   ALKPHOS 89 05/06/2013   BILITOT 0.2 05/06/2013   GFRNONAA 55* 05/08/2013   GFRAA >60 05/08/2013    Lab Results  Component  Value Date   WBC 9.0 05/08/2013   NEUTROABS 6.3 05/08/2013   HGB 10.7* 05/08/2013   HCT 31.2* 05/08/2013   MCV 88 05/08/2013   PLT 215 05/08/2013     STUDIES: No results found.  ASSESSMENT: Stage Ia ER/PR adenocarcinoma breast.  PLAN:    1.  Breast cancer: No evidence of disease.  Continue letrozole 2.5 mg daily, completing 5 years of treatment in April 2017. Patient has approximately 60 days left of her most recent prescription and has been instructed to complete it and not refill. Patient's most recent left breast mammogram on March 05, 2015 was reported as BI-RADS 1, repeat in August 2017.  Patient will return to clinic in 6 months for routine evaluation. Patient could possibly be discharged from clinic at this time. 2. Osteopenia: Bone mineral density on Dec 03, 2014 revealed a T score of -2.3, which is unchanged from previous. Continue calcium and vitamin D. Repeat in May 2017.  Patient expressed understanding and was in agreement with this plan. She also understands that She can call clinic at any time with any questions, concerns, or complaints.    Lloyd Huger, MD   10/24/2015 3:53 PM

## 2015-11-14 ENCOUNTER — Ambulatory Visit
Admission: RE | Admit: 2015-11-14 | Discharge: 2015-11-14 | Disposition: A | Discharge status: Home / Self Care | Payer: Medicare Other | Source: Ambulatory Visit | Attending: Oncology | Admitting: Oncology

## 2015-11-14 DIAGNOSIS — M85852 Other specified disorders of bone density and structure, left thigh: Secondary | ICD-10-CM | POA: Diagnosis not present

## 2015-11-14 DIAGNOSIS — Z79899 Other long term (current) drug therapy: Secondary | ICD-10-CM | POA: Diagnosis present

## 2015-11-14 DIAGNOSIS — Z853 Personal history of malignant neoplasm of breast: Secondary | ICD-10-CM | POA: Diagnosis present

## 2015-11-14 DIAGNOSIS — M8580 Other specified disorders of bone density and structure, unspecified site: Secondary | ICD-10-CM

## 2015-11-14 DIAGNOSIS — Z780 Asymptomatic menopausal state: Secondary | ICD-10-CM | POA: Insufficient documentation

## 2015-11-18 ENCOUNTER — Ambulatory Visit: Payer: Medicare Other

## 2016-03-06 ENCOUNTER — Other Ambulatory Visit: Payer: Self-pay | Admitting: Oncology

## 2016-03-06 ENCOUNTER — Ambulatory Visit
Admission: RE | Admit: 2016-03-06 | Discharge: 2016-03-06 | Disposition: A | Discharge status: Home / Self Care | Payer: Medicare Other | Source: Ambulatory Visit | Attending: Oncology | Admitting: Oncology

## 2016-03-06 DIAGNOSIS — Z9011 Acquired absence of right breast and nipple: Secondary | ICD-10-CM | POA: Diagnosis not present

## 2016-03-06 DIAGNOSIS — C50911 Malignant neoplasm of unspecified site of right female breast: Secondary | ICD-10-CM

## 2016-03-06 DIAGNOSIS — Z1231 Encounter for screening mammogram for malignant neoplasm of breast: Secondary | ICD-10-CM | POA: Diagnosis not present

## 2016-04-01 DIAGNOSIS — I350 Nonrheumatic aortic (valve) stenosis: Secondary | ICD-10-CM

## 2016-04-22 ENCOUNTER — Ambulatory Visit: Payer: Medicare Other | Admitting: Oncology

## 2016-04-23 ENCOUNTER — Ambulatory Visit: Payer: Medicare Other | Admitting: Oncology

## 2016-07-25 ENCOUNTER — Emergency Department: Payer: Medicare Other

## 2016-07-25 ENCOUNTER — Emergency Department
Admission: EM | Admit: 2016-07-25 | Discharge: 2016-07-25 | Disposition: A | Discharge status: Home / Self Care | Payer: Medicare Other | Attending: Emergency Medicine | Admitting: Emergency Medicine

## 2016-07-25 ENCOUNTER — Encounter: Payer: Self-pay | Admitting: Emergency Medicine

## 2016-07-25 DIAGNOSIS — S8001XA Contusion of right knee, initial encounter: Secondary | ICD-10-CM | POA: Insufficient documentation

## 2016-07-25 DIAGNOSIS — S82831A Other fracture of upper and lower end of right fibula, initial encounter for closed fracture: Secondary | ICD-10-CM | POA: Insufficient documentation

## 2016-07-25 DIAGNOSIS — W19XXXA Unspecified fall, initial encounter: Secondary | ICD-10-CM

## 2016-07-25 DIAGNOSIS — Z87891 Personal history of nicotine dependence: Secondary | ICD-10-CM | POA: Insufficient documentation

## 2016-07-25 DIAGNOSIS — Y999 Unspecified external cause status: Secondary | ICD-10-CM | POA: Diagnosis not present

## 2016-07-25 DIAGNOSIS — W010XXA Fall on same level from slipping, tripping and stumbling without subsequent striking against object, initial encounter: Secondary | ICD-10-CM | POA: Insufficient documentation

## 2016-07-25 DIAGNOSIS — Z853 Personal history of malignant neoplasm of breast: Secondary | ICD-10-CM | POA: Diagnosis not present

## 2016-07-25 DIAGNOSIS — S60511A Abrasion of right hand, initial encounter: Secondary | ICD-10-CM | POA: Insufficient documentation

## 2016-07-25 DIAGNOSIS — Y929 Unspecified place or not applicable: Secondary | ICD-10-CM | POA: Insufficient documentation

## 2016-07-25 DIAGNOSIS — Y939 Activity, unspecified: Secondary | ICD-10-CM | POA: Insufficient documentation

## 2016-07-25 DIAGNOSIS — Z7982 Long term (current) use of aspirin: Secondary | ICD-10-CM | POA: Diagnosis not present

## 2016-07-25 DIAGNOSIS — I10 Essential (primary) hypertension: Secondary | ICD-10-CM | POA: Insufficient documentation

## 2016-07-25 DIAGNOSIS — S61212A Laceration without foreign body of right middle finger without damage to nail, initial encounter: Secondary | ICD-10-CM | POA: Insufficient documentation

## 2016-07-25 DIAGNOSIS — S8991XA Unspecified injury of right lower leg, initial encounter: Secondary | ICD-10-CM | POA: Diagnosis present

## 2016-07-25 HISTORY — DX: Disorder of thyroid, unspecified: E07.9

## 2016-07-25 HISTORY — DX: Essential (primary) hypertension: I10

## 2016-07-25 NOTE — ED Triage Notes (Signed)
Pt. States at around 11 am this morning fell on ice.  Pt. Has swelling to rt. Ankle.  Pt. Also has bruising to rt. Hand.

## 2016-07-25 NOTE — ED Provider Notes (Signed)
[facility] Emergency Department Provider Note ____________________________________________  Time seen: 1909  I have reviewed the triage vital signs and the nursing notes.  HISTORY  Chief Complaint  Fall (Pt. states falling on ice around 11 am today.)  HPI Carrie [name] is a 83 y.o. female present to the ED with her adult nephews for evaluation of injuries sustained after a fall on her icy driveway, while taking the trash out. She describes falling and rolling the right ankle after slipping on ice. She reports her neighbors helped her up. She describes delayed onset of swelling and bruising to the ankle laterally. She has been ambulating with the assistance of a cane she borrowed. 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 She still drives and lives independently. She does not want to miss work on Monday, due to the "sprained" ankle.   Past Medical History:  Diagnosis Date  . Breast cancer ([place]) 2012   right  . Hypertension   . Thyroid disease     Patient Active Problem List   Diagnosis Date Noted  . Osteopenia 12/06/2014    Past Surgical History:  Procedure Laterality Date  . ABDOMINAL HYSTERECTOMY    . APPENDECTOMY    . MASTECTOMY Right 2012  . TONSILLECTOMY      Prior to Admission medications   Medication Sig Start Date End Date Taking? Authorizing Provider  amLODipine-benazepril (LOTREL) 5-10 MG capsule Take 1 capsule by mouth. 04/01/15   Historical Provider, MD  aspirin EC 81 MG tablet Take 81 mg by mouth.    Historical Provider, MD  brimonidine (ALPHAGAN P) 0.1 % SOLN 1 drop. 09/10/14   Historical Provider, MD  brimonidine (ALPHAGAN) 0.15 % ophthalmic solution 1 drop. 11/29/14   Historical Provider, MD  Cholecalciferol (VITAMIN D3) 1000 UNITS CAPS Take 1 capsule by mouth.    Historical Provider, MD  furosemide (LASIX) 20 MG tablet Take 20 mg by mouth. 03/20/14   Historical Provider, MD   letrozole (FEMARA) 2.5 MG tablet Take 2.5 mg by mouth.    Historical Provider, MD  levothyroxine (SYNTHROID, LEVOTHROID) 75 MCG tablet  09/10/14   Historical Provider, MD  Omega-3 1000 MG CAPS Take 2 capsules by mouth once a day.    Historical Provider, MD  prednisoLONE acetate (PRED FORTE) 1 % ophthalmic suspension  09/10/14   Historical Provider, MD    Allergies Other and Amoxicillin  Family History  Problem Relation Age of Onset  . Breast cancer Sister     Social History Social History  Substance Use Topics  . Smoking status: Former Smoker    Types: Cigarettes    Quit date: 37  . Smokeless tobacco: Never Used  . Alcohol use No    Review of Systems  Constitutional: Negative for fever. Cardiovascular: Negative for chest pain. Respiratory: Negative for shortness of breath. Gastrointestinal: Negative for abdominal pain, vomiting and diarrhea. Genitourinary: Negative for dysuria. Musculoskeletal: Negative for back pain. Right ankle pain as above.  Skin: Negative for rash. Neurological: Negative for headaches, focal weakness or numbness. ____________________________________________  PHYSICAL EXAM:  VITAL SIGNS: ED Triage Vitals  Enc Vitals Group     BP 07/25/16 1849 137/69     Pulse Rate 07/25/16 1849 68     Resp 07/25/16 1849 18     Temp 07/25/16 1849 98.3 F (36.8 C)     Temp src --      SpO2 07/25/16 1849 99 %  Weight 07/25/16 1850 120 lb (54.4 kg)     Height 07/25/16 1850 5\' 4"  (1.626 m)     Head Circumference --      Peak Flow --      Pain Score 07/25/16 1850 6     Pain Loc --      Pain Edu? --      Excl. in [place]? --     Constitutional: Alert and oriented. Well appearing and in no distress. Head: Normocephalic and atraumatic. Eyes: Conjunctivae are normal. PERRL. Normal extraocular movements Ears: Canals clear. TMs intact bilaterally. Nose: No congestion/rhinorrhea/epistaxis. Mouth/Throat: Mucous membranes are moist. Neck: Supple. No  thyromegaly. Hematological/Lymphatic/Immunological: No cervical lymphadenopathy. Cardiovascular: Normal rate, regular rhythm. Normal distal pulses. Respiratory: Normal respiratory effort. No wheezes/rales/rhonchi. Gastrointestinal: Soft and nontender. No distention. Musculoskeletal: Normal composite fist on the right. Small laceration to the dorsal PIP of the middle finger. Right ankle with moderate swelling and ecchymosis noted laterally, primarily. Right knee with a medial aspect bruise to the tibia. Normal knee exam without laxity.  Nontender with normal range of motion in all extremities.  Neurologic:  Normal gait without ataxia. Normal speech and language. No gross focal neurologic deficits are appreciated. Skin:  Skin is warm, dry and intact. No rash noted. Psychiatric: Mood and affect are normal. Patient exhibits appropriate insight and judgment. ____________________________________________   RADIOLOGY  Right Ankle  IMPRESSION: Essentially nondisplaced fracture involving the distal fibular metaphysis with lucency extending to the superolateral articular surface of the ankle joint.  Possible lucency in the distal fibular metaphysis, cannot exclude a pathologic fracture.  Right Knee  IMPRESSION: No fracture or subluxation. Mild for age tricompartmental osteoarthritis.  Right Hand  IMPRESSION: No acute osseous abnormality. ____________________________________________  PROCEDURES  Velcro Stirrup Splint  LACERATION REPAIR Performed by: Melvenia Needles Authorized by: Melvenia Needles Consent: Verbal consent obtained. Risks and benefits: risks, benefits and alternatives were discussed Consent given by: patient Patient identity confirmed: provided demographic data Prepped and Draped in normal sterile fashion Wound explored  Laceration Location: right 3rd digit PIP knuckle  Laceration Length: 0.5 cm  No Foreign Bodies seen or  palpated  Anesthesia:none  Irrigation method: gauze   Amount of cleaning: standard  Skin closure: cyanoacrylate wound adhesive  Patient tolerance: Patient tolerated the procedure well with no immediate complications. ____________________________________________  INITIAL IMPRESSION / ASSESSMENT AND PLAN / ED COURSE  Patient with a fall with multiple injuries. She is given primary fracture management of a closed distal fibular fracture on the right. She has a superficial abrasion to right hand which is repaired using wound glue. She also has contusion to the right knee. She is discharged to follow-up with Dr. Vickki Muff for further fracture care. She should return to the ED as needed for worsening symptoms. ____________________________________________  FINAL CLINICAL IMPRESSION(S) / ED DIAGNOSES  Final diagnoses:  Fall, initial encounter  Closed fracture of distal end of right fibula, unspecified fracture morphology, initial encounter  Abrasion of right hand, initial encounter  Contusion of right knee, initial encounter      Melvenia Needles, PA-C 07/27/16 0007    Harvest Dark, MD 07/27/16 2320

## 2016-07-25 NOTE — ED Notes (Signed)
Pt. Going home with family. 

## 2016-07-25 NOTE — Discharge Instructions (Signed)
Your exam and x-rays show a non-displaced fracture to the small bone of the ankle. Wear the splint to walk and use your cane to support yourself. Take Tylenol or Motrin for pain relief. Rest with the foot elevated and apply ice to reduce symptoms. Call Dr. Alvera Singh office next week to schedule a follow-up appointment. Be careful driving next week. Consider getting a ride to and from work.

## 2016-07-29 ENCOUNTER — Encounter: Payer: Self-pay | Admitting: *Deleted

## 2016-07-29 ENCOUNTER — Emergency Department: Payer: Medicare Other

## 2016-07-29 ENCOUNTER — Emergency Department
Admission: EM | Admit: 2016-07-29 | Discharge: 2016-07-29 | Disposition: A | Discharge status: Home / Self Care | Payer: Medicare Other | Attending: Emergency Medicine | Admitting: Emergency Medicine

## 2016-07-29 DIAGNOSIS — Z7982 Long term (current) use of aspirin: Secondary | ICD-10-CM | POA: Diagnosis not present

## 2016-07-29 DIAGNOSIS — Z87891 Personal history of nicotine dependence: Secondary | ICD-10-CM | POA: Diagnosis not present

## 2016-07-29 DIAGNOSIS — Z79899 Other long term (current) drug therapy: Secondary | ICD-10-CM | POA: Insufficient documentation

## 2016-07-29 DIAGNOSIS — S8011XA Contusion of right lower leg, initial encounter: Secondary | ICD-10-CM | POA: Diagnosis not present

## 2016-07-29 DIAGNOSIS — Y929 Unspecified place or not applicable: Secondary | ICD-10-CM | POA: Insufficient documentation

## 2016-07-29 DIAGNOSIS — S8991XA Unspecified injury of right lower leg, initial encounter: Secondary | ICD-10-CM | POA: Diagnosis present

## 2016-07-29 DIAGNOSIS — Z853 Personal history of malignant neoplasm of breast: Secondary | ICD-10-CM | POA: Insufficient documentation

## 2016-07-29 DIAGNOSIS — X58XXXA Exposure to other specified factors, initial encounter: Secondary | ICD-10-CM | POA: Diagnosis not present

## 2016-07-29 DIAGNOSIS — Y999 Unspecified external cause status: Secondary | ICD-10-CM | POA: Diagnosis not present

## 2016-07-29 DIAGNOSIS — I10 Essential (primary) hypertension: Secondary | ICD-10-CM | POA: Insufficient documentation

## 2016-07-29 DIAGNOSIS — Y939 Activity, unspecified: Secondary | ICD-10-CM | POA: Diagnosis not present

## 2016-07-29 MED ORDER — CEPHALEXIN 250 MG PO CAPS
250.0000 mg | ORAL_CAPSULE | Freq: Once | ORAL | Status: AC
  Administered 2016-07-29: 250 mg via ORAL
  Filled 2016-07-29: qty 1

## 2016-07-29 MED ORDER — CEPHALEXIN 250 MG PO CAPS: 250.0000 mg | ORAL_CAPSULE | Freq: Four times a day (QID) | ORAL | 0 refills | Status: AC

## 2016-07-29 NOTE — ED Triage Notes (Signed)
States she was here Saturday night and told she had a broken ankle, states Monday she noticed her right leg turning red, leg red in color from knee down, denies any worsening pain, brace in place

## 2016-07-29 NOTE — ED Provider Notes (Signed)
[facility] Emergency Department Provider Note   ____________________________________________   First MD Initiated Contact with Patient 07/29/16 1107     (approximate)  I have reviewed the triage vital signs and the nursing notes.   HISTORY  Chief Complaint Leg Pain    HPI Carrie [name] is a 83 y.o. female here for evaluation of swelling and bruising in the right lower leg.  The patient reports that she fell 2 days ago. He is here with her nephew.  She is not having any trouble walking or get around. She has minimal tenderness just at the bottom of the right ankle. She's noticed in the last 2 days that the right lower leg from the knee down has had increasing bruising, slight swelling mostly around the ankle, and redness on the skin.  No fevers or chills. Has follow-up with podiatry scheduled tomorrow. No shortness of breath, nausea, vomiting. She reports that she is doing well except for the leg noticeably becoming bruits.  Past Medical History:  Diagnosis Date  . Breast cancer ([place]) 2012   right  . Hypertension   . Thyroid disease     Patient Active Problem List   Diagnosis Date Noted  . Osteopenia 12/06/2014    Past Surgical History:  Procedure Laterality Date  . ABDOMINAL HYSTERECTOMY    . APPENDECTOMY    . MASTECTOMY Right 2012  . TONSILLECTOMY      Prior to Admission medications   Medication Sig Start Date End Date Taking? Authorizing Provider  amLODipine-benazepril (LOTREL) 5-10 MG capsule Take 1 capsule by mouth. 04/01/15   Historical Provider, MD  aspirin EC 81 MG tablet Take 81 mg by mouth.    Historical Provider, MD  brimonidine (ALPHAGAN P) 0.1 % SOLN 1 drop. 09/10/14   Historical Provider, MD  brimonidine (ALPHAGAN) 0.15 % ophthalmic solution 1 drop. 11/29/14   Historical Provider, MD  cephALEXin (KEFLEX) 250 MG capsule Take 1 capsule (250 mg total) by mouth 4 (four) times daily. 07/29/16 08/08/16  Delman Kitten, MD  Cholecalciferol  (VITAMIN D3) 1000 UNITS CAPS Take 1 capsule by mouth.    Historical Provider, MD  furosemide (LASIX) 20 MG tablet Take 20 mg by mouth. 03/20/14   Historical Provider, MD  letrozole (FEMARA) 2.5 MG tablet Take 2.5 mg by mouth.    Historical Provider, MD  levothyroxine (SYNTHROID, LEVOTHROID) 75 MCG tablet  09/10/14   Historical Provider, MD  Omega-3 1000 MG CAPS Take 2 capsules by mouth once a day.    Historical Provider, MD  prednisoLONE acetate (PRED FORTE) 1 % ophthalmic suspension  09/10/14   Historical Provider, MD    Allergies Other and Amoxicillin  Family History  Problem Relation Age of Onset  . Breast cancer Sister     Social History Social History  Substance Use Topics  . Smoking status: Former Smoker    Types: Cigarettes    Quit date: 15  . Smokeless tobacco: Never Used  . Alcohol use No    Review of Systems Constitutional: No fever/chills Cardiovascular: Denies chest pain. Respiratory: Denies shortness of breath. Gastrointestinal: No abdominal pain.  No nausea, no vomiting.  Musculoskeletal: Negative for back pain.Describes tenderness in the right ankle only. No numbness tingling or weakness in the feet, the right foot has not had any numbness or tingling. No cold or blue feet or toes. Skin: See history of present illness Neurological: Negative for headaches, focal weakness or numbness.  10-point ROS otherwise negative.  ____________________________________________  PHYSICAL EXAM:  VITAL SIGNS: ED Triage Vitals  Enc Vitals Group     BP 07/29/16 1058 (!) 133/54     Pulse Rate 07/29/16 1058 73     Resp 07/29/16 1058 18     Temp 07/29/16 1058 98.2 F (36.8 C)     Temp Source 07/29/16 1058 Oral     SpO2 07/29/16 1058 98 %     Weight 07/29/16 1057 120 lb (54.4 kg)     Height 07/29/16 1057 5\' 4"  (1.626 m)     Head Circumference --      Peak Flow --      Pain Score 07/29/16 1057 0     Pain Loc --      Pain Edu? --      Excl. in [place]? --      Constitutional: Alert and oriented. Well appearing and in no acute distress. The patient and her nephew are extremely pleasant. The patient appears younger than her stated age. Eyes: Conjunctivae are normal. PERRL. EOMI. Head: Atraumatic. Nose: No congestion/rhinnorhea. Mouth/Throat: Mucous membranes are moist.  Oropharynx non-erythematous. Cardiovascular: Normal rate, regular rhythm. Grossly normal heart sounds.  Good peripheral circulation. Strong dorsalis pedis and posterior tibial pulses in the right lower extremity. No decreased capillary refill. Respiratory: Normal respiratory effort.  No retractions. Musculoskeletal: Right hand Median, ulnar, radial motor intact. Cap refill less than 2 seconds all digits. Strong radial pulse. 5 out of 5 strength throughout the hand intrinsics, flexion and extension at the wrist. No evidence of trauma except for bruising across the surface of the hand and fingers, there is also old laceration that is clean dry and intact after Dermabond on previous visit.  Left hand Median, ulnar, radial motor intact. Cap refill less than 2 seconds all digits. Strong radial pulse. 5 out of 5 strength throughout the hand intrinsics, flexion and extension at the wrist. No evidence of trauma. ____________________________________________  Lower Extremities  Normal DP/PT pulses  Normal neuro-motor function lower extremities bilateral.  RIGHT Right lower extremity demonstrates normal strength, good use of all muscles. No edema bruising or contusions of the right hip, right knee. Full range of motion of the right lower extremity without pain except for mild discomfort at the right ankle joint where there is about 1+ edema localized around the ankle. There is no pain across the knee or upper external lower extremity. No pain or bruising over the proximal tibia/fibula.  The patient has extensive ecchymosis overlying the right lower extremity, without warmth or laceration.  There is no induration, no focal erythema. She has extensive ecchymoses in different stages of color change including some purple, some yellow and some redness area  LEFT Left lower extremity demonstrates normal strength, good use of all muscles. No edema bruising or contusions of the hip,  knee, ankle. Full range of motion of the left lower extremity without pain. No pain on axial loading. No evidence of trauma.  Neurologic:  Normal speech and language. No gross focal neurologic deficits are appreciated. No gait instability. Skin:  Skin is warm, dry and intact. No rash noted. Psychiatric: Mood and affect are normal. Speech and behavior are normal.  ____________________________________________   LABS (all labs ordered are listed, but only abnormal results are displayed)  Labs Reviewed - No data to display ____________________________________________  EKG   ____________________________________________  RADIOLOGY  US Venous Img Lower Unilateral Right  Result Date: 07/29/2016 CLINICAL DATA:  Right lower extremity swelling. EXAM: RIGHT LOWER EXTREMITY VENOUS DOPPLER ULTRASOUND TECHNIQUE:  Gray-scale sonography with graded compression, as well as color Doppler and duplex ultrasound were performed to evaluate the lower extremity deep venous systems from the level of the common femoral vein and including the common femoral, femoral, profunda femoral, popliteal and calf veins including the posterior tibial, peroneal and gastrocnemius veins when visible. The superficial great saphenous vein was also interrogated. Spectral Doppler was utilized to evaluate flow at rest and with distal augmentation maneuvers in the common femoral, femoral and popliteal veins. COMPARISON:  None. FINDINGS: Contralateral Common Femoral Vein: Left common femoral vein is patent without thrombus. Common Femoral Vein: No evidence of thrombus. Normal compressibility, respiratory phasicity and response to augmentation.  Saphenofemoral Junction: No evidence of thrombus. Normal compressibility and flow on color Doppler imaging. Profunda Femoral Vein: No evidence of thrombus. Normal compressibility and flow on color Doppler imaging. Femoral Vein: No evidence of thrombus. Normal compressibility, respiratory phasicity and response to augmentation. Popliteal Vein: No evidence of thrombus. Normal compressibility, respiratory phasicity and response to augmentation. Calf Veins: No evidence of thrombus. Normal compressibility and flow on color Doppler imaging. IMPRESSION: Negative for deep venous thrombosis in right lower extremity. Electronically Signed   By: Markus Daft M.D.   On: 07/29/2016 12:06    ____________________________________________   PROCEDURES  Procedure(s) performed: None  Procedures  Critical Care performed: No  ____________________________________________   INITIAL IMPRESSION / ASSESSMENT AND PLAN / ED COURSE  Pertinent labs & imaging results that were available during my care of the patient were reviewed by me and considered in my medical decision making (see chart for details).  Patient resents for evaluation of right lower extremity injury noting increased bruising and redness. Appears most consistent with ongoing ecchymoses now showing up as the patient is quite thin skin. Reassuring neuro vascular examination of the extremity. I suspect most likely 90% chance this is just worsening or bruising, though given her age and the associated redness with some of the ecchymoses is slightly unclear if there could be an underlying cellulitis. We will also assure no DVT. Has appropriate follow-up tomorrow. We'll outline areas of contusion/redness, start patient on cephalexin and have her follow up closely with her podiatrist tomorrow  The patient reports in amoxicillin and penicillin allergy, given her age so I'm hesitant to treat with Bactrim, doxycycline, or clindamycin due to side effects. She denies any  severe side effects or symptoms with penicillin reports only a skin rash which was mild. I will start her on cephalexin here.  Return precautions and treatment recommendations and follow-up discussed with the patient who is agreeable with the plan.       ____________________________________________   FINAL CLINICAL IMPRESSION(S) / ED DIAGNOSES  Final diagnoses:  Contusion of right lower leg, initial encounter      NEW MEDICATIONS STARTED DURING THIS VISIT:  New Prescriptions   CEPHALEXIN (KEFLEX) 250 MG CAPSULE    Take 1 capsule (250 mg total) by mouth 4 (four) times daily.     Note:  This document was prepared using Dragon voice recognition software and may include unintentional dictation errors.     Delman Kitten, MD 07/29/16 [phone]

## 2016-09-20 ENCOUNTER — Emergency Department
Admission: EM | Admit: 2016-09-20 | Discharge: 2016-09-20 | Disposition: A | Discharge status: Home / Self Care | Payer: Medicare Other | Attending: Emergency Medicine | Admitting: Emergency Medicine

## 2016-09-20 ENCOUNTER — Emergency Department: Payer: Medicare Other

## 2016-09-20 ENCOUNTER — Encounter: Payer: Self-pay | Admitting: Emergency Medicine

## 2016-09-20 DIAGNOSIS — L03115 Cellulitis of right lower limb: Secondary | ICD-10-CM | POA: Insufficient documentation

## 2016-09-20 DIAGNOSIS — I10 Essential (primary) hypertension: Secondary | ICD-10-CM | POA: Insufficient documentation

## 2016-09-20 DIAGNOSIS — Z853 Personal history of malignant neoplasm of breast: Secondary | ICD-10-CM | POA: Insufficient documentation

## 2016-09-20 DIAGNOSIS — Z87891 Personal history of nicotine dependence: Secondary | ICD-10-CM | POA: Diagnosis not present

## 2016-09-20 DIAGNOSIS — Z79899 Other long term (current) drug therapy: Secondary | ICD-10-CM | POA: Insufficient documentation

## 2016-09-20 DIAGNOSIS — M7989 Other specified soft tissue disorders: Secondary | ICD-10-CM

## 2016-09-20 DIAGNOSIS — Z7982 Long term (current) use of aspirin: Secondary | ICD-10-CM | POA: Diagnosis not present

## 2016-09-20 LAB — COMPREHENSIVE METABOLIC PANEL
ALBUMIN: 3.6 g/dL (ref 3.5–5.0)
ALT: 14 U/L (ref 14–54)
ANION GAP: 7 (ref 5–15)
AST: 25 U/L (ref 15–41)
Alkaline Phosphatase: 65 U/L (ref 38–126)
BUN: 31 mg/dL — AB (ref 6–20)
CO2: 23 mmol/L (ref 22–32)
Calcium: 9.1 mg/dL (ref 8.9–10.3)
Chloride: 109 mmol/L (ref 101–111)
Creatinine, Ser: 1.15 mg/dL — ABNORMAL HIGH (ref 0.44–1.00)
GFR calc Af Amer: 47 mL/min — ABNORMAL LOW (ref >60)
GFR calc non Af Amer: 40 mL/min — ABNORMAL LOW (ref >60)
GLUCOSE: 125 mg/dL — AB (ref 65–99)
POTASSIUM: 4.3 mmol/L (ref 3.5–5.1)
SODIUM: 139 mmol/L (ref 135–145)
Total Bilirubin: 0.2 mg/dL — ABNORMAL LOW (ref 0.3–1.2)
Total Protein: 6.7 g/dL (ref 6.5–8.1)

## 2016-09-20 LAB — CBC WITH DIFFERENTIAL/PLATELET
BASOS ABS: 0.1 10*3/uL (ref 0–0.1)
BASOS PCT: 1 %
EOS ABS: 0.3 10*3/uL (ref 0–0.7)
Eosinophils Relative: 4 %
HEMATOCRIT: 36.3 % (ref 35.0–47.0)
HEMOGLOBIN: 12.5 g/dL (ref 12.0–16.0)
Lymphocytes Relative: 12 %
Lymphs Abs: 1.1 10*3/uL (ref 1.0–3.6)
MCH: 31 pg (ref 26.0–34.0)
MCHC: 34.3 g/dL (ref 32.0–36.0)
MCV: 90.2 fL (ref 80.0–100.0)
MONO ABS: 1.3 10*3/uL — AB (ref 0.2–0.9)
MONOS PCT: 13 %
NEUTROS PCT: 70 %
Neutro Abs: 6.7 10*3/uL — ABNORMAL HIGH (ref 1.4–6.5)
Platelets: 184 10*3/uL (ref 150–440)
RBC: 4.02 MIL/uL (ref 3.80–5.20)
RDW: 14.3 % (ref 11.5–14.5)
WBC: 9.5 10*3/uL (ref 3.6–11.0)

## 2016-09-20 LAB — PROTIME-INR
INR: 0.97
Prothrombin Time: 12.9 seconds (ref 11.4–15.2)

## 2016-09-20 MED ORDER — CEPHALEXIN 500 MG PO CAPS: 500.0000 mg | ORAL_CAPSULE | Freq: Four times a day (QID) | ORAL | 0 refills | Status: DC

## 2016-09-20 MED ORDER — CEPHALEXIN 500 MG PO CAPS
500.0000 mg | ORAL_CAPSULE | Freq: Once | ORAL | Status: AC
  Administered 2016-09-20: 500 mg via ORAL
  Filled 2016-09-20: qty 1

## 2016-09-20 NOTE — Discharge Instructions (Signed)
As we discussed, your vital signs and lab work are reassuring today, and there is no sign of blood clot in the deep veins of your leg.  Your physical exam is concerning for cellulitis (infection) or an inflammation of the blood vessels.  We spoke by phone with Dr. Delana Meyer, one of our local vascular surgeons, and he recommended the following and said that he will see you in clinic tomorrow for a follow-up appointment:  Please keep the Ace wrap lightly in place on your leg and elevate your leg as much as possible.  Minimize walking and minimize the use of your boot/splint.  Take the prescribed antibiotics as written.  Call the number listed in this documentation for Dr. Delana Meyer at about 8:30 in the morning.  Explained that you were seen in the emergency department and that Dr. Delana Meyer said he wants to see you in clinic today (Monday).  If the office staff has any questions they should speak with Dr. Delana Meyer directly.    Return to the emergency department if you develop new or worsening symptoms that concern you.

## 2016-09-20 NOTE — ED Triage Notes (Signed)
Pt presents to ED with c/o R leg swelling that started Friday. Pt's RLE is noted to be red and swollen, pt c/o heat to the touch and discomfort. Pt states in January she had a R Tib fracture, is currently wearing a boot and using a cane. Pt denies any further symptoms at this time.

## 2016-09-20 NOTE — ED Provider Notes (Signed)
[facility] Emergency Department Provider Note  ____________________________________________   First MD Initiated Contact with Patient 09/20/16 1834     (approximate)  I have reviewed the triage vital signs and the nursing notes.   HISTORY  Chief Complaint Leg Swelling    HPI Carrie [name] is a 83 y.o. female with a recent history of distal right lower leg fracture cared for by Dr. Vickki Muff who presents by private vehicle for acute onset right lower leg swelling and redness.  Reportedly the swelling has been gradually getting worse over the last 3 days but the redness just started today.  She denies any pain, numbness, nor weakness.  She continues to use a boot and cane for walking but she was going to be cleared by podiatry this coming week and should not have to use the boot anymore.  She is quite active for her age and denies any other significant chronic medical issues.  She does state that about 2 months ago, just after the initial orthopedic injury, she developed some redness in the leg and was started on Keflex which completely resolved the redness.  However at that time the redness and swelling were less significant.  She denies chest pain, shortness of breath, nausea, vomiting, abdominal pain.  She describes the swelling or redness in her leg as severe and nothing in particular makes it better nor worse.   Past Medical History:  Diagnosis Date  . Breast cancer ([place]) 2012   right  . Hypertension   . Thyroid disease     Patient Active Problem List   Diagnosis Date Noted  . Osteopenia 12/06/2014    Past Surgical History:  Procedure Laterality Date  . ABDOMINAL HYSTERECTOMY    . APPENDECTOMY    . MASTECTOMY Right 2012  . TONSILLECTOMY      Prior to Admission medications   Medication Sig Start Date End Date Taking? Authorizing Provider  amLODipine-benazepril (LOTREL) 5-10 MG capsule Take 1 capsule by mouth daily.  04/01/15  Yes Historical  Provider, MD  aspirin EC 81 MG tablet Take 81 mg by mouth.   Yes Historical Provider, MD  brimonidine (ALPHAGAN) 0.15 % ophthalmic solution Place 1 drop into the right eye 2 (two) times daily.  11/29/14  Yes Historical Provider, MD  calcium carbonate (TUMS - DOSED IN MG ELEMENTAL CALCIUM) 500 MG chewable tablet Chew 2 tablets by mouth daily.   Yes Historical Provider, MD  Cholecalciferol (VITAMIN D3) 1000 UNITS CAPS Take 1 capsule by mouth daily.    Yes Historical Provider, MD  furosemide (LASIX) 20 MG tablet Take 20 mg by mouth daily as needed.  03/20/14  Yes Historical Provider, MD  levothyroxine (SYNTHROID, LEVOTHROID) 75 MCG tablet Take 75 mcg by mouth daily before breakfast.  09/10/14  Yes Historical Provider, MD  Multiple Vitamins-Minerals (PRESERVISION AREDS 2 PO) Take 1 tablet by mouth 2 (two) times daily.   Yes Historical Provider, MD  Omega-3 1000 MG CAPS Take 2 capsules by mouth once a day.   Yes Historical Provider, MD  prednisoLONE acetate (PRED FORTE) 1 % ophthalmic suspension Place 1 drop into the left eye daily.  09/10/14  Yes Historical Provider, MD  cephALEXin (KEFLEX) 500 MG capsule Take 1 capsule (500 mg total) by mouth 4 (four) times daily. 09/20/16   Hinda Kehr, MD  letrozole [facility]) 2.5 MG tablet Take 2.5 mg by mouth.    Historical Provider, MD    Allergies Other and Amoxicillin  Family History  Problem  Relation Age of Onset  . Breast cancer Sister     Social History Social History  Substance Use Topics  . Smoking status: Former Smoker    Types: Cigarettes    Quit date: 4  . Smokeless tobacco: Never Used  . Alcohol use No    Review of Systems Constitutional: No fever/chills Eyes: No visual changes. ENT: No sore throat. Cardiovascular: Denies chest pain. Respiratory: Denies shortness of breath. Gastrointestinal: No abdominal pain.  No nausea, no vomiting.  No diarrhea.  No constipation. Genitourinary: Negative for dysuria. Musculoskeletal/Skin: Patient has  gradually worsening swelling and acute redness in the right lower extremity with no pain and no numbness or tingling Neurological: Negative for headaches, focal weakness or numbness.  10-point ROS otherwise negative.  ____________________________________________   PHYSICAL EXAM:  VITAL SIGNS: ED Triage Vitals [09/20/16 1633]  Enc Vitals Group     BP (!) 154/47     Pulse Rate 73     Resp 18     Temp 98.6 F (37 C)     Temp Source Oral     SpO2 96 %     Weight 120 lb (54.4 kg)     Height 5\' 4"  (1.626 m)     Head Circumference      Peak Flow      Pain Score      Pain Loc      Pain Edu?      Excl. in [place]?     Constitutional: Alert and oriented. Well appearing and in no acute distress. Eyes: Conjunctivae are normal. PERRL. EOMI. Head: Atraumatic. Nose: No congestion/rhinnorhea. Mouth/Throat: Mucous membranes are moist. Neck: No stridor.  No meningeal signs.   Cardiovascular: Normal rate, regular rhythm. Good peripheral circulation. Grossly normal heart sounds. Respiratory: Normal respiratory effort.  No retractions. Lungs CTAB. Gastrointestinal: Soft and nontender. No distention.  Musculoskeletal: Severe blanching erythema of the right lower extremity from the knee down to the foot.  She also has scattered petechiae most notable just inferior to the knee.  There is no sloughing of the skin and no areas of induration.  She is neurovascularly intact down to the foot.  Even though it looks as if it would be quite hot, it is the same temperature to the touch as is the normal appearing left lower extremity.  It is nontender to palpation.  There is a photo in the Media tab of Chart Review. Neurologic:  Normal speech and language. No gross focal neurologic deficits are appreciated.    ____________________________________________   LABS (all labs ordered are listed, but only abnormal results are displayed)  Labs Reviewed  CBC WITH DIFFERENTIAL/PLATELET - Abnormal; Notable for the  following:       Result Value   Neutro Abs 6.7 (*)    Monocytes Absolute 1.3 (*)    All other components within normal limits  COMPREHENSIVE METABOLIC PANEL - Abnormal; Notable for the following:    Glucose, Bld 125 (*)    BUN 31 (*)    Creatinine, Ser 1.15 (*)    Total Bilirubin 0.2 (*)    GFR calc non Af Amer 40 (*)    GFR calc Af Amer 47 (*)    All other components within normal limits  PROTIME-INR   ____________________________________________  EKG  None - EKG not ordered by ED physician ____________________________________________  RADIOLOGY   US Venous Img Lower Unilateral Right  Result Date: 09/20/2016 CLINICAL DATA:  83 year old female with right lower extremity swelling and redness for  3 days. EXAM: Right LOWER EXTREMITY VENOUS DOPPLER ULTRASOUND TECHNIQUE: Gray-scale sonography with graded compression, as well as color Doppler and duplex ultrasound were performed to evaluate the lower extremity deep venous systems from the level of the common femoral vein and including the common femoral, femoral, profunda femoral, popliteal and calf veins including the posterior tibial, peroneal and gastrocnemius veins when visible. The superficial great saphenous vein was also interrogated. Spectral Doppler was utilized to evaluate flow at rest and with distal augmentation maneuvers in the common femoral, femoral and popliteal veins. COMPARISON:  Ultrasound dated 07/29/2016 FINDINGS: Contralateral Common Femoral Vein: Respiratory phasicity is normal and symmetric with the symptomatic side. No evidence of thrombus. Normal compressibility. Common Femoral Vein: No evidence of thrombus. Normal compressibility, respiratory phasicity and response to augmentation. Saphenofemoral Junction: No evidence of thrombus. Normal compressibility and flow on color Doppler imaging. Profunda Femoral Vein: No evidence of thrombus. Normal compressibility and flow on color Doppler imaging. Femoral Vein: No evidence of  thrombus. Normal compressibility, respiratory phasicity and response to augmentation. Popliteal Vein: No evidence of thrombus. Normal compressibility, respiratory phasicity and response to augmentation. Calf Veins: No evidence of thrombus. Normal compressibility and flow on color Doppler imaging. Superficial Great Saphenous Vein: No evidence of thrombus. Normal compressibility and flow on color Doppler imaging. Other Findings: There is a 4.3 x 1.4 x 2.7 cm fluid collection in the right popliteal fossa likely a Baker's cyst. IMPRESSION: No evidence of deep venous thrombosis in the right lower extremity. Probable right popliteal fossa cyst. Electronically Signed   By: Anner Crete M.D.   On: 09/20/2016 17:57    ____________________________________________   PROCEDURES  Procedure(s) performed:   Procedures   Critical Care performed: No ____________________________________________   INITIAL IMPRESSION / ASSESSMENT AND PLAN / ED COURSE  Pertinent labs & imaging results that were available during my care of the patient were reviewed by me and considered in my medical decision making (see chart for details).  The appearance of the leg suggests DVT, vasculitis, or cellulitis.  However the ultrasound showed no evidence of DVT.  She has no leukocytosis, is afebrile, has no tachycardia, and no pain.  She does not appear in any other way to have an infectious process.  As documented above, there is a photo of her legs in the media tab of chart review.  I called and spoke by phone with Dr. Delana Meyer and discussed the case.  He viewed the photograph and agrees it is concerning but without a clear diagnosis.  He recommended starting her back on Keflex and he will see her in the clinic tomorrow.  I went over the plan with the patient, who very much does not want to stay in the hospital and became upset when I initially suggested that she stay for IV antibiotics.  I explained to her and her 2 adult  nephews that she needs to keep her leg elevated whenever possible, wear an Ace wrap lightly around the leg, minimize ambulation, minimize the use of the surgical boot, and follow-up tomorrow.  I gave my usual and customary return precautions.       ____________________________________________  FINAL CLINICAL IMPRESSION(S) / ED DIAGNOSES  Final diagnoses:  Right leg swelling  Cellulitis of right lower extremity     MEDICATIONS GIVEN DURING THIS VISIT:  Medications  cephALEXin (KEFLEX) capsule 500 mg (500 mg Oral Given 09/20/16 1923)     NEW OUTPATIENT MEDICATIONS STARTED DURING THIS VISIT:  New Prescriptions   CEPHALEXIN (KEFLEX) 500 MG  CAPSULE    Take 1 capsule (500 mg total) by mouth 4 (four) times daily.    Modified Medications   No medications on file    Discontinued Medications   BRIMONIDINE (ALPHAGAN P) 0.1 % SOLN    1 drop.     Note:  This document was prepared using Dragon voice recognition software and may include unintentional dictation errors.    Hinda Kehr, MD 09/20/16 1945

## 2016-09-21 ENCOUNTER — Other Ambulatory Visit (INDEPENDENT_AMBULATORY_CARE_PROVIDER_SITE_OTHER): Payer: Self-pay | Admitting: Vascular Surgery

## 2016-09-21 ENCOUNTER — Encounter (INDEPENDENT_AMBULATORY_CARE_PROVIDER_SITE_OTHER): Payer: Self-pay | Admitting: Vascular Surgery

## 2016-09-21 ENCOUNTER — Ambulatory Visit (INDEPENDENT_AMBULATORY_CARE_PROVIDER_SITE_OTHER): Payer: Medicare Other

## 2016-09-21 ENCOUNTER — Ambulatory Visit (INDEPENDENT_AMBULATORY_CARE_PROVIDER_SITE_OTHER): Payer: Medicare Other | Admitting: Vascular Surgery

## 2016-09-21 DIAGNOSIS — M7989 Other specified soft tissue disorders: Secondary | ICD-10-CM

## 2016-09-21 DIAGNOSIS — L03115 Cellulitis of right lower limb: Secondary | ICD-10-CM

## 2016-09-21 DIAGNOSIS — M79604 Pain in right leg: Secondary | ICD-10-CM | POA: Diagnosis not present

## 2016-09-24 ENCOUNTER — Ambulatory Visit (INDEPENDENT_AMBULATORY_CARE_PROVIDER_SITE_OTHER): Payer: Medicare Other | Admitting: Vascular Surgery

## 2016-09-24 ENCOUNTER — Encounter (INDEPENDENT_AMBULATORY_CARE_PROVIDER_SITE_OTHER): Payer: Self-pay | Admitting: Vascular Surgery

## 2016-09-24 VITALS — BP 156/67 | HR 61 | Resp 15 | Ht 63.0 in | Wt 116.0 lb

## 2016-09-24 DIAGNOSIS — M7989 Other specified soft tissue disorders: Secondary | ICD-10-CM | POA: Diagnosis not present

## 2016-09-24 DIAGNOSIS — L0390 Cellulitis, unspecified: Secondary | ICD-10-CM | POA: Insufficient documentation

## 2016-09-24 DIAGNOSIS — L03115 Cellulitis of right lower limb: Secondary | ICD-10-CM

## 2016-09-24 DIAGNOSIS — M79609 Pain in unspecified limb: Secondary | ICD-10-CM | POA: Insufficient documentation

## 2016-09-24 NOTE — Progress Notes (Signed)
MRN : [id number]  Carrie [name] is a 83 y.o. (04/13/1923) female who presents with chief complaint of  Chief Complaint  Patient presents with  . Follow-up  .  History of Present Illness: Patient is seen for evaluation of right leg swelling. The patient first noticed the swelling just a few days ago. The swelling is associated with severe discoloration. The patient notes that in the morning the legs are slightly better but it steadily worsenes throughout the course of the day. Elevation makes the legs better, dependency makes them much worse. She was seen in the ER yesterday and started on Keflex.  Ultrasound was negative for DVT in the femoral and popliteal veins.  There is no history of ulcerations associated with the swelling.   The patient denies any recent changes in their medications.  The patient has not been wearing graduated compression.  The patient has no had any past angiography, interventions or vascular surgery.  The patient denies a history of DVT or PE. There is no prior history of phlebitis. There is no history of primary lymphedema.  There is no history of radiation treatment to the groin or pelvis No history of malignancies. No history of trauma or groin or pelvic surgery. No history of foreign travel or parasitic infections area      Current Meds  Medication Sig  . amLODipine-benazepril (LOTREL) 5-10 MG capsule Take 1 capsule by mouth daily.   Marland Kitchen aspirin EC 81 MG tablet Take 81 mg by mouth.  . brimonidine (ALPHAGAN) 0.15 % ophthalmic solution Place 1 drop into the right eye 2 (two) times daily.   . calcium carbonate (TUMS - DOSED IN MG ELEMENTAL CALCIUM) 500 MG chewable tablet Chew 2 tablets by mouth daily.  . cephALEXin (KEFLEX) 500 MG capsule Take 1 capsule (500 mg total) by mouth 4 (four) times daily.  . Cholecalciferol (VITAMIN D3) 1000 UNITS CAPS Take 1 capsule by mouth daily.   . furosemide (LASIX) 20 MG tablet Take 20 mg by mouth daily as needed.     Marland Kitchen levothyroxine (SYNTHROID, LEVOTHROID) 75 MCG tablet Take 75 mcg by mouth daily before breakfast.   . Multiple Vitamins-Minerals (PRESERVISION AREDS 2 PO) Take 1 tablet by mouth 2 (two) times daily.  . Omega-3 1000 MG CAPS Take 2 capsules by mouth once a day.  . prednisoLONE acetate (PRED FORTE) 1 % ophthalmic suspension Place 1 drop into the left eye daily.     Past Medical History:  Diagnosis Date  . Breast cancer ([place]) 2012   right  . Hypertension   . Thyroid disease     Past Surgical History:  Procedure Laterality Date  . ABDOMINAL HYSTERECTOMY    . APPENDECTOMY    . MASTECTOMY Right 2012  . TONSILLECTOMY      Social History Social History  Substance Use Topics  . Smoking status: Former Smoker    Types: Cigarettes    Quit date: 52  . Smokeless tobacco: Never Used  . Alcohol use No    Family History Family History  Problem Relation Age of Onset  . Breast cancer Sister   No family history of bleeding/clotting disorders, porphyria or autoimmune disease   Allergies  Allergen Reactions  . Other Other (See Comments)    "eye drop, unsure of name that caused  severe burning"  . Amoxicillin Rash     REVIEW OF SYSTEMS (Negative unless checked)  Constitutional: [] Weight loss  [] Fever  [] Chills Cardiac: [] Chest pain   [] Chest pressure   []   Palpitations   [] Shortness of breath when laying flat   [] Shortness of breath with exertion. Vascular:  [] Pain in legs with walking   [] Pain in legs at rest  [] History of DVT   [] Phlebitis   [x] Swelling in legs   [] Varicose veins   [] Non-healing ulcers Pulmonary:   [] Uses home oxygen   [] Productive cough   [] Hemoptysis   [] Wheeze  [] COPD   [] Asthma Neurologic:  [] Dizziness   [] Seizures   [] History of stroke   [] History of TIA  [] Aphasia   [] Vissual changes   [] Weakness or numbness in arm   [] Weakness or numbness in leg Musculoskeletal:   [x] Joint swelling   [x] Joint pain   [] Low back pain Hematologic:  [] Easy bruising  [] Easy  bleeding   [] Hypercoagulable state   [] Anemic Gastrointestinal:  [] Diarrhea   [] Vomiting  [] Gastroesophageal reflux/heartburn   [] Difficulty swallowing. Genitourinary:  [] Chronic kidney disease   [] Difficult urination  [] Frequent urination   [] Blood in urine Skin:  [x] Rashes   [] Ulcers  Psychological:  [] History of anxiety   []  History of major depression.  Physical Examination  Vitals:   09/21/16 1600  BP: 133/70  Pulse: 67  Resp: 16  Weight: 53.5 kg (118 lb)   Body mass index is 20.25 kg/m. Gen: WD/WN, NAD Head: [place]/AT, No temporalis wasting.  Ear/Nose/Throat: Hearing grossly intact, nares w/o erythema or drainage, poor dentition Eyes: PER, EOMI, sclera nonicteric.  Neck: Supple, no masses.  No bruit or JVD.  Pulmonary:  Good air movement, clear to auscultation bilaterally, no use of accessory muscles.  Cardiac: RRR, normal S1, S2, no Murmurs. Vascular:  Right leg is red and blanches with palpations but it is nt warm or tender to touch.  2+ edema Vessel Right Left  Radial Palpable Palpable  Ulnar Palpable Palpable  Brachial Palpable Palpable  Carotid Palpable Palpable  Femoral Palpable Palpable  Popliteal Palpable Palpable  PT Palpable Palpable  DP Palpable Palpable  Gastrointestinal: soft, non-distended. No guarding/no peritoneal signs.  Musculoskeletal: M/S 5/5 throughout.  No deformity or atrophy.  Neurologic: CN 2-12 intact. Pain and light touch intact in extremities.  Symmetrical.  Speech is fluent. Motor exam as listed above. Psychiatric: Judgment intact, Mood & affect appropriate for pt's clinical situation. Dermatologic: No rashes or ulcers noted.  No changes consistent with cellulitis. Lymph : No Cervical lymphadenopathy, no lichenification or skin changes of chronic lymphedema.  CBC Lab Results  Component Value Date   WBC 9.5 09/20/2016   HGB 12.5 09/20/2016   HCT 36.3 09/20/2016   MCV 90.2 09/20/2016   PLT 184 09/20/2016    BMET    Component Value  Date/Time   NA 139 09/20/2016 1642   NA 141 05/08/2013 0506   K 4.3 09/20/2016 1642   K 3.9 05/08/2013 0506   CL 109 09/20/2016 1642   CL 112 (H) 05/08/2013 0506   CO2 23 09/20/2016 1642   CO2 26 05/08/2013 0506   GLUCOSE 125 (H) 09/20/2016 1642   GLUCOSE 94 05/08/2013 0506   BUN 31 (H) 09/20/2016 1642   BUN 9 05/08/2013 0506   CREATININE 1.15 (H) 09/20/2016 1642   CREATININE 0.94 05/08/2013 0506   CALCIUM 9.1 09/20/2016 1642   CALCIUM 8.3 (L) 05/08/2013 0506   GFRNONAA 40 (L) 09/20/2016 1642   GFRNONAA 55 (L) 05/08/2013 0506   GFRAA 47 (L) 09/20/2016 1642   GFRAA >60 05/08/2013 0506   Estimated Creatinine Clearance: 26.9 mL/min (A) (by C-G formula based on SCr of 1.15 mg/dL (H)).  COAG Lab Results  Component Value Date   INR 0.97 09/20/2016   INR 1.0 05/06/2013   INR 0.9 11/20/2012    Radiology US Venous Img Lower Unilateral Right  Result Date: 09/20/2016 CLINICAL DATA:  83 year old female with right lower extremity swelling and redness for 3 days. EXAM: Right LOWER EXTREMITY VENOUS DOPPLER ULTRASOUND TECHNIQUE: Gray-scale sonography with graded compression, as well as color Doppler and duplex ultrasound were performed to evaluate the lower extremity deep venous systems from the level of the common femoral vein and including the common femoral, femoral, profunda femoral, popliteal and calf veins including the posterior tibial, peroneal and gastrocnemius veins when visible. The superficial great saphenous vein was also interrogated. Spectral Doppler was utilized to evaluate flow at rest and with distal augmentation maneuvers in the common femoral, femoral and popliteal veins. COMPARISON:  Ultrasound dated 07/29/2016 FINDINGS: Contralateral Common Femoral Vein: Respiratory phasicity is normal and symmetric with the symptomatic side. No evidence of thrombus. Normal compressibility. Common Femoral Vein: No evidence of thrombus. Normal compressibility, respiratory phasicity and response  to augmentation. Saphenofemoral Junction: No evidence of thrombus. Normal compressibility and flow on color Doppler imaging. Profunda Femoral Vein: No evidence of thrombus. Normal compressibility and flow on color Doppler imaging. Femoral Vein: No evidence of thrombus. Normal compressibility, respiratory phasicity and response to augmentation. Popliteal Vein: No evidence of thrombus. Normal compressibility, respiratory phasicity and response to augmentation. Calf Veins: No evidence of thrombus. Normal compressibility and flow on color Doppler imaging. Superficial Great Saphenous Vein: No evidence of thrombus. Normal compressibility and flow on color Doppler imaging. Other Findings: There is a 4.3 x 1.4 x 2.7 cm fluid collection in the right popliteal fossa likely a Baker's cyst. IMPRESSION: No evidence of deep venous thrombosis in the right lower extremity. Probable right popliteal fossa cyst. Electronically Signed   By: Anner Crete M.D.   On: 09/20/2016 17:57    Assessment/Plan 1. Cellulitis of right lower extremity Continue keflex Possibly place in an unna boot  She will follow up in one week  2. Swelling of limb See #1  3. Pain of right lower extremity See #1    Hortencia Pilar, MD  09/24/2016 10:17 AM

## 2016-09-28 ENCOUNTER — Ambulatory Visit (INDEPENDENT_AMBULATORY_CARE_PROVIDER_SITE_OTHER): Payer: Medicare Other | Admitting: Vascular Surgery

## 2016-09-28 ENCOUNTER — Encounter (INDEPENDENT_AMBULATORY_CARE_PROVIDER_SITE_OTHER): Payer: Self-pay | Admitting: Vascular Surgery

## 2016-09-28 VITALS — BP 133/69 | HR 68 | Resp 17 | Wt 118.0 lb

## 2016-09-28 DIAGNOSIS — L03115 Cellulitis of right lower limb: Secondary | ICD-10-CM | POA: Diagnosis not present

## 2016-09-28 DIAGNOSIS — M7989 Other specified soft tissue disorders: Secondary | ICD-10-CM | POA: Diagnosis not present

## 2016-09-28 NOTE — Progress Notes (Signed)
Subjective:    Patient ID: Carrie [name], female    DOB: 1923/06/01, 83 y.o.   MRN: [id number] Chief Complaint  Patient presents with  . Re-evaluation    Follow up   Patient presents for a cellulitis check. She was last seen on 09/21/16, RX'd Keflex and encouraged to elevate her legs. Patient presents today with minimal improvement in her cellulitis. She denies any fever, nausea or vomiting. No increasing pain in her lower extremity. No ulceration of skin.     Review of Systems  Constitutional: Negative.   HENT: Negative.   Eyes: Negative.   Respiratory: Negative.   Cardiovascular: Positive for leg swelling.  Gastrointestinal: Negative.   Endocrine: Negative.   Genitourinary: Negative.   Musculoskeletal: Negative.   Skin: Positive for color change.  Allergic/Immunologic: Negative.   Neurological: Negative.   Hematological: Negative.   Psychiatric/Behavioral: Negative.       Objective:   Physical Exam  Constitutional: She is oriented to person, place, and time. She appears well-developed and well-nourished.  HENT:  Head: Normocephalic and atraumatic.  Eyes: Conjunctivae are normal. Pupils are equal, round, and reactive to light.  Neck: Normal range of motion.  Cardiovascular: Normal rate, regular rhythm and normal heart sounds.   Pulses:      Radial pulses are 2+ on the right side, and 2+ on the left side.  Right Lower Extremity: Minimal improvement in cellulitis. No ulcer formation.  Pulmonary/Chest: Effort normal.  Musculoskeletal: Normal range of motion. She exhibits edema.  RLE: Moderate edema.  LLE: Mild edema.  Neurological: She is alert and oriented to person, place, and time.  Psychiatric: She has a normal mood and affect. Her behavior is normal. Judgment and thought content normal.   BP (!) 156/67 (BP Location: Left Arm)   Pulse 61   Resp 15   Ht 5\' 3"  (1.6 m)   Wt 52.6 kg (116 lb)   BMI 20.55 kg/m   Past Medical History:  Diagnosis Date  . Breast  cancer ([place]) 2012   right  . Hypertension   . Thyroid disease    Social History   Social History  . Marital status: Widowed    Spouse name: N/A  . Number of children: N/A  . Years of education: N/A   Occupational History  . Not on file.   Social History Main Topics  . Smoking status: Former Smoker    Types: Cigarettes    Quit date: 84  . Smokeless tobacco: Never Used  . Alcohol use No  . Drug use: No  . Sexual activity: Not on file   Other Topics Concern  . Not on file   Social History Narrative  . No narrative on file   Past Surgical History:  Procedure Laterality Date  . ABDOMINAL HYSTERECTOMY    . APPENDECTOMY    . MASTECTOMY Right 2012  . TONSILLECTOMY     Family History  Problem Relation Age of Onset  . Breast cancer Sister    Allergies  Allergen Reactions  . Other Other (See Comments)    "eye drop, unsure of name that caused  severe burning"  . Amoxicillin Rash       Assessment & Plan:  Patient presents for a cellulitis check. She was last seen on 09/21/16, RX'd Keflex and encouraged to elevate her legs. Patient presents today with minimal improvement in her cellulitis. She denies any fever, nausea or vomiting. No increasing pain in her lower extremity. No ulceration of skin.  1. Cellulitis of right lower extremity - Stable Patient with minimal improvement in cellulitis.  Will stop Keflex and RX Doxycycline.  Patient to follow up on Monday for skin check. Patient encouraged to seek medical attention in the ED over the weekend if condition worsens.   2. Swelling of limb - Stable Patient has been elevating her legs however still with some moderate edema.  Current Outpatient Prescriptions on File Prior to Visit  Medication Sig Dispense Refill  . amLODipine-benazepril (LOTREL) 5-10 MG capsule Take 1 capsule by mouth daily.     Marland Kitchen aspirin EC 81 MG tablet Take 81 mg by mouth.    . brimonidine (ALPHAGAN) 0.15 % ophthalmic solution Place 1 drop into  the right eye 2 (two) times daily.     . calcium carbonate (TUMS - DOSED IN MG ELEMENTAL CALCIUM) 500 MG chewable tablet Chew 2 tablets by mouth daily.    . cephALEXin (KEFLEX) 500 MG capsule Take 1 capsule (500 mg total) by mouth 4 (four) times daily. 40 capsule 0  . Cholecalciferol (VITAMIN D3) 1000 UNITS CAPS Take 1 capsule by mouth daily.     . furosemide (LASIX) 20 MG tablet Take 20 mg by mouth daily as needed.     Marland Kitchen letrozole (FEMARA) 2.5 MG tablet Take 2.5 mg by mouth.    . levothyroxine (SYNTHROID, LEVOTHROID) 75 MCG tablet Take 75 mcg by mouth daily before breakfast.     . Multiple Vitamins-Minerals (PRESERVISION AREDS 2 PO) Take 1 tablet by mouth 2 (two) times daily.    . Omega-3 1000 MG CAPS Take 2 capsules by mouth once a day.    . prednisoLONE acetate (PRED FORTE) 1 % ophthalmic suspension Place 1 drop into the left eye daily.      No current facility-administered medications on file prior to visit.     There are no Patient Instructions on file for this visit. No Follow-up on file.   Arisha Gervais A Lestine Rahe, PA-C

## 2016-09-28 NOTE — Progress Notes (Signed)
Subjective:    Patient ID: Carrie [name], female    DOB: [birth year], 83 y.o.   MRN: [id number] Chief Complaint  Patient presents with  . Follow-up   Patient presents for a skin / cellulitis check. She was last seen on 09/24/16. She presents today feeling better. States improvement in the "redness" of her leg. States the warm has also improved. She has been elevating her legs. She has approximately seven days left or antibiotics.    Review of Systems  Constitutional: Negative.   HENT: Negative.   Eyes: Negative.   Respiratory: Negative.   Cardiovascular: Negative.   Gastrointestinal: Negative.   Endocrine: Negative.   Genitourinary: Negative.   Musculoskeletal: Negative.   Skin: Positive for color change.  Allergic/Immunologic: Negative.   Neurological: Negative.   Hematological: Negative.   Psychiatric/Behavioral: Negative.       Objective:   Physical Exam  Constitutional: She is oriented to person, place, and time. She appears well-developed and well-nourished.  HENT:  Head: Normocephalic and atraumatic.  Eyes: Conjunctivae are normal. Pupils are equal, round, and reactive to light.  Neck: Normal range of motion.  Cardiovascular: Normal rate, regular rhythm and normal heart sounds.   Pulses:      Radial pulses are 2+ on the right side, and 2+ on the left side.  Pulmonary/Chest: Effort normal.  Musculoskeletal: Normal range of motion. She exhibits edema (Right Moderate Swelling. Left Mild Swelling.).  Neurological: She is alert and oriented to person, place, and time.  Skin:  Improvement in cellulitis. Less erythema today then on Thursday.   Psychiatric: She has a normal mood and affect. Her behavior is normal. Judgment and thought content normal.   BP 133/69   Pulse 68   Resp 17   Wt 53.5 kg (118 lb)   BMI 20.90 kg/m   Past Medical History:  Diagnosis Date  . Breast cancer ([place]) 2012   right  . Hypertension   . Thyroid disease    Social History   Social  History  . Marital status: Widowed    Spouse name: N/A  . Number of children: N/A  . Years of education: N/A   Occupational History  . Not on file.   Social History Main Topics  . Smoking status: Former Smoker    Types: Cigarettes    Quit date: 88  . Smokeless tobacco: Never Used  . Alcohol use No  . Drug use: No  . Sexual activity: Not on file   Other Topics Concern  . Not on file   Social History Narrative  . No narrative on file   Past Surgical History:  Procedure Laterality Date  . ABDOMINAL HYSTERECTOMY    . APPENDECTOMY    . MASTECTOMY Right 2012  . TONSILLECTOMY     Family History  Problem Relation Age of Onset  . Breast cancer Sister    Allergies  Allergen Reactions  . Other Other (See Comments)    "eye drop, unsure of name that caused  severe burning"  . Amoxicillin Rash      Assessment & Plan:  Patient presents for a skin / cellulitis check. She was last seen on 09/24/16. She presents today feeling better. States improvement in the "redness" of her leg. States the warm has also improved. She has been elevating her legs. She has approximately seven days left or antibiotics.   1. Cellulitis of right lower extremity - improvement Patient to finish course of ABX. Less erythematous today but still with  some moderate edema to the right leg. Patient to start wearing compression stockings. Information given. Patient to follow up in two weeks.   2. Swelling of limb - Stable Lower extremity edema stable. Patient to start wearing compression stockings. Patient to continue elevation. Possible lymphedema pump in future if edema does not improve with compression.   Current Outpatient Prescriptions on File Prior to Visit  Medication Sig Dispense Refill  . amLODipine-benazepril (LOTREL) 5-10 MG capsule Take 1 capsule by mouth daily.     Marland Kitchen aspirin EC 81 MG tablet Take 81 mg by mouth.    . brimonidine (ALPHAGAN) 0.15 % ophthalmic solution Place 1 drop into the  right eye 2 (two) times daily.     . calcium carbonate (TUMS - DOSED IN MG ELEMENTAL CALCIUM) 500 MG chewable tablet Chew 2 tablets by mouth daily.    . cephALEXin (KEFLEX) 500 MG capsule Take 1 capsule (500 mg total) by mouth 4 (four) times daily. 40 capsule 0  . Cholecalciferol (VITAMIN D3) 1000 UNITS CAPS Take 1 capsule by mouth daily.     . furosemide (LASIX) 20 MG tablet Take 20 mg by mouth daily as needed.     Marland Kitchen letrozole (FEMARA) 2.5 MG tablet Take 2.5 mg by mouth.    . levothyroxine (SYNTHROID, LEVOTHROID) 75 MCG tablet Take 75 mcg by mouth daily before breakfast.     . Multiple Vitamins-Minerals (PRESERVISION AREDS 2 PO) Take 1 tablet by mouth 2 (two) times daily.    . Omega-3 1000 MG CAPS Take 2 capsules by mouth once a day.    . prednisoLONE acetate (PRED FORTE) 1 % ophthalmic suspension Place 1 drop into the left eye daily.      No current facility-administered medications on file prior to visit.     There are no Patient Instructions on file for this visit. No Follow-up on file.   Aislinn Feliz A Destenie Ingber, PA-C

## 2016-10-15 ENCOUNTER — Encounter (INDEPENDENT_AMBULATORY_CARE_PROVIDER_SITE_OTHER): Payer: Self-pay | Admitting: Vascular Surgery

## 2016-10-15 ENCOUNTER — Ambulatory Visit (INDEPENDENT_AMBULATORY_CARE_PROVIDER_SITE_OTHER): Payer: Medicare Other | Admitting: Vascular Surgery

## 2016-10-15 VITALS — BP 146/60 | HR 62 | Resp 16 | Ht 63.0 in | Wt 117.0 lb

## 2016-10-15 DIAGNOSIS — M7989 Other specified soft tissue disorders: Secondary | ICD-10-CM | POA: Diagnosis not present

## 2016-10-15 DIAGNOSIS — L03115 Cellulitis of right lower limb: Secondary | ICD-10-CM

## 2016-10-15 NOTE — Progress Notes (Signed)
Subjective:    Patient ID: Carrie [name], female    DOB: 12/04/1922, 83 y.o.   MRN: [id number] Chief Complaint  Patient presents with  . Re-evaluation    2 week follow up   Patient last seen on 09/28/16. She presents today for a right lower extremity cellulitis and edema follow up. Since her last visit she has finished her course of ABX and has started to wear compression stockings and engage in elevation. Seen with son. She has noticed an improvement in the pain, erythema and swelling of her right lower extremity. She is comfortable wearing compression. She denies fever, nausea or vomiting.    Review of Systems  Constitutional: Negative.   HENT: Negative.   Eyes: Negative.   Respiratory: Negative.   Cardiovascular: Negative.   Gastrointestinal: Negative.   Endocrine: Negative.   Genitourinary: Negative.   Musculoskeletal: Negative.   Skin: Negative.   Allergic/Immunologic: Negative.   Neurological: Negative.   Hematological: Negative.   Psychiatric/Behavioral: Negative.       Objective:   Physical Exam  Constitutional: She is oriented to person, place, and time. She appears well-developed and well-nourished. No distress.  HENT:  Head: Normocephalic and atraumatic.  Eyes: Conjunctivae are normal. Pupils are equal, round, and reactive to light.  Neck: Normal range of motion.  Cardiovascular: Normal rate, regular rhythm and normal heart sounds.   Pulmonary/Chest: Effort normal.  Musculoskeletal: Normal range of motion. She exhibits edema (Mild bilateral edema).  Neurological: She is alert and oriented to person, place, and time.  Skin: She is not diaphoretic.  Erythema has resolved. Cellulitis is resolved.  Vitals reviewed.  BP (!) 146/60 (BP Location: Left Arm)   Pulse 62   Resp 16   Ht 5\' 3"  (1.6 m)   Wt 117 lb (53.1 kg)   BMI 20.73 kg/m   Past Medical History:  Diagnosis Date  . Breast cancer ([place]) 2012   right  . Hypertension   . Thyroid disease    Social  History   Social History  . Marital status: Widowed    Spouse name: N/A  . Number of children: N/A  . Years of education: N/A   Occupational History  . Not on file.   Social History Main Topics  . Smoking status: Former Smoker    Types: Cigarettes    Quit date: 31  . Smokeless tobacco: Never Used  . Alcohol use No  . Drug use: No  . Sexual activity: Not on file   Other Topics Concern  . Not on file   Social History Narrative  . No narrative on file   Past Surgical History:  Procedure Laterality Date  . ABDOMINAL HYSTERECTOMY    . APPENDECTOMY    . MASTECTOMY Right 2012  . TONSILLECTOMY     Family History  Problem Relation Age of Onset  . Breast cancer Sister    Allergies  Allergen Reactions  . Other Other (See Comments)    "eye drop, unsure of name that caused  severe burning"  . Amoxicillin Rash      Assessment & Plan:  Patient last seen on 09/28/16. She presents today for a right lower extremity cellulitis and edema follow up. Since her last visit she has finished her course of ABX and has started to wear compression stockings and engage in elevation. Seen with son. She has noticed an improvement in the pain, erythema and swelling of her right lower extremity. She is comfortable wearing compression. She denies fever,  nausea or vomiting.   1. Cellulitis of right lower extremity - Resolved Resolved with course of ABX and compression socks.  2. Swelling of limb - Improvement Continue compression and elevation. Follow up PRN. We discussed a lymphedema pump if conventional therapy goes not work. The patient was instructed to call the office in the interim if any worsening edema or ulcerations to the legs, feet or toes occurs. The patient expresses their understanding.  Current Outpatient Prescriptions on File Prior to Visit  Medication Sig Dispense Refill  . amLODipine-benazepril (LOTREL) 5-10 MG capsule Take 1 capsule by mouth daily.     Marland Kitchen aspirin EC 81 MG  tablet Take 81 mg by mouth.    . brimonidine (ALPHAGAN) 0.15 % ophthalmic solution Place 1 drop into the right eye 2 (two) times daily.     . calcium carbonate (TUMS - DOSED IN MG ELEMENTAL CALCIUM) 500 MG chewable tablet Chew 2 tablets by mouth daily.    . cephALEXin (KEFLEX) 500 MG capsule Take 1 capsule (500 mg total) by mouth 4 (four) times daily. 40 capsule 0  . Cholecalciferol (VITAMIN D3) 1000 UNITS CAPS Take 1 capsule by mouth daily.     . furosemide (LASIX) 20 MG tablet Take 20 mg by mouth daily as needed.     Marland Kitchen letrozole (FEMARA) 2.5 MG tablet Take 2.5 mg by mouth.    . levothyroxine (SYNTHROID, LEVOTHROID) 75 MCG tablet Take 75 mcg by mouth daily before breakfast.     . Multiple Vitamins-Minerals (PRESERVISION AREDS 2 PO) Take 1 tablet by mouth 2 (two) times daily.    . Omega-3 1000 MG CAPS Take 2 capsules by mouth once a day.    . prednisoLONE acetate (PRED FORTE) 1 % ophthalmic suspension Place 1 drop into the left eye daily.      No current facility-administered medications on file prior to visit.     There are no Patient Instructions on file for this visit. No Follow-up on file.   Eloyse Causey A Berry Gallacher, PA-C

## 2016-11-10 DIAGNOSIS — E039 Hypothyroidism, unspecified: Secondary | ICD-10-CM | POA: Diagnosis present

## 2016-11-25 ENCOUNTER — Other Ambulatory Visit: Payer: Self-pay | Admitting: Obstetrics and Gynecology

## 2016-11-25 DIAGNOSIS — Z1231 Encounter for screening mammogram for malignant neoplasm of breast: Secondary | ICD-10-CM

## 2017-03-10 ENCOUNTER — Ambulatory Visit
Admission: RE | Admit: 2017-03-10 | Discharge: 2017-03-10 | Disposition: A | Discharge status: Home / Self Care | Payer: Medicare Other | Source: Ambulatory Visit | Attending: Obstetrics and Gynecology | Admitting: Obstetrics and Gynecology

## 2017-03-10 DIAGNOSIS — Z1231 Encounter for screening mammogram for malignant neoplasm of breast: Secondary | ICD-10-CM | POA: Diagnosis present

## 2017-11-03 ENCOUNTER — Other Ambulatory Visit: Payer: Self-pay | Admitting: Obstetrics and Gynecology

## 2017-11-03 DIAGNOSIS — Z1231 Encounter for screening mammogram for malignant neoplasm of breast: Secondary | ICD-10-CM

## 2018-03-03 DIAGNOSIS — N183 Chronic kidney disease, stage 3 unspecified: Secondary | ICD-10-CM | POA: Diagnosis present

## 2018-03-15 ENCOUNTER — Ambulatory Visit
Admission: RE | Admit: 2018-03-15 | Discharge: 2018-03-15 | Disposition: A | Discharge status: Home / Self Care | Payer: Medicare Other | Source: Ambulatory Visit | Attending: Obstetrics and Gynecology | Admitting: Obstetrics and Gynecology

## 2018-03-15 DIAGNOSIS — Z1231 Encounter for screening mammogram for malignant neoplasm of breast: Secondary | ICD-10-CM

## 2018-05-02 ENCOUNTER — Other Ambulatory Visit: Payer: Self-pay

## 2018-05-02 ENCOUNTER — Encounter: Payer: Self-pay | Admitting: Emergency Medicine

## 2018-05-02 ENCOUNTER — Emergency Department
Admission: EM | Admit: 2018-05-02 | Discharge: 2018-05-02 | Disposition: A | Discharge status: Home / Self Care | Payer: Medicare Other | Attending: Emergency Medicine | Admitting: Emergency Medicine

## 2018-05-02 ENCOUNTER — Emergency Department: Payer: Medicare Other

## 2018-05-02 DIAGNOSIS — Y92039 Unspecified place in apartment as the place of occurrence of the external cause: Secondary | ICD-10-CM | POA: Diagnosis not present

## 2018-05-02 DIAGNOSIS — W101XXA Fall (on)(from) sidewalk curb, initial encounter: Secondary | ICD-10-CM | POA: Insufficient documentation

## 2018-05-02 DIAGNOSIS — Y939 Activity, unspecified: Secondary | ICD-10-CM | POA: Diagnosis not present

## 2018-05-02 DIAGNOSIS — Z87891 Personal history of nicotine dependence: Secondary | ICD-10-CM | POA: Insufficient documentation

## 2018-05-02 DIAGNOSIS — Z853 Personal history of malignant neoplasm of breast: Secondary | ICD-10-CM | POA: Diagnosis not present

## 2018-05-02 DIAGNOSIS — Z23 Encounter for immunization: Secondary | ICD-10-CM | POA: Insufficient documentation

## 2018-05-02 DIAGNOSIS — S5001XA Contusion of right elbow, initial encounter: Secondary | ICD-10-CM | POA: Insufficient documentation

## 2018-05-02 DIAGNOSIS — Z79899 Other long term (current) drug therapy: Secondary | ICD-10-CM | POA: Insufficient documentation

## 2018-05-02 DIAGNOSIS — W19XXXA Unspecified fall, initial encounter: Secondary | ICD-10-CM

## 2018-05-02 DIAGNOSIS — S51011A Laceration without foreign body of right elbow, initial encounter: Secondary | ICD-10-CM | POA: Diagnosis present

## 2018-05-02 DIAGNOSIS — Z9011 Acquired absence of right breast and nipple: Secondary | ICD-10-CM | POA: Diagnosis not present

## 2018-05-02 DIAGNOSIS — I10 Essential (primary) hypertension: Secondary | ICD-10-CM | POA: Diagnosis not present

## 2018-05-02 DIAGNOSIS — Y999 Unspecified external cause status: Secondary | ICD-10-CM | POA: Insufficient documentation

## 2018-05-02 MED ORDER — TETANUS-DIPHTH-ACELL PERTUSSIS 5-2.5-18.5 LF-MCG/0.5 IM SUSP
0.5000 mL | Freq: Once | INTRAMUSCULAR | Status: AC
Start: 2018-05-02 — End: 2018-05-02
  Administered 2018-05-02: 0.5 mL via INTRAMUSCULAR
  Filled 2018-05-02: qty 0.5

## 2018-05-02 MED ORDER — LIDOCAINE HCL (PF) 1 % IJ SOLN
10.0000 mL | Freq: Once | INTRAMUSCULAR | Status: AC
Start: 2018-05-02 — End: 2018-05-02
  Administered 2018-05-02: 10 mL
  Filled 2018-05-02: qty 10

## 2018-05-02 NOTE — ED Triage Notes (Signed)
Fell at home.  Tripped over curb.  Injured right elbow.  Joint does not hurt, but has deep laceration over elbow.  Denies any other injuries

## 2018-05-02 NOTE — Discharge Instructions (Addendum)
Follow-up with your primary care provider in 14 days for suture removal or return to the emergency department for suture removal.  Keep the area clean and dry.  Leave the dressing that you have on today for 2 days then remove and clean with mild soap and water.  Allow your elbow to dry completely before applying a another dressing.  Then daily clean your elbow with mild soap and water and allowed to dry before applying another dressing.  Call your primary care provider to see if they are able to take your sutures out if not return to the emergency department in 12 to 14 days for suture removal.  If there are any signs of infection such as drainage, heat, fever or increased pain return to the emergency room sooner.  Take Tylenol if needed for pain.

## 2018-05-02 NOTE — ED Notes (Signed)
See triage note  States she tripped over her curb states she landed on a nature area  Landed on elbow  Denies any pain but has a large laceration

## 2018-05-02 NOTE — ED Notes (Signed)
Taken to xray via wheelchair

## 2018-05-02 NOTE — ED Provider Notes (Signed)
[facility] Emergency Department Provider Note   ____________________________________________   First MD Initiated Contact with Patient 05/02/18 1337     (approximate)  I have reviewed the triage vital signs and the nursing notes.   HISTORY  Chief Complaint Laceration   HPI Carrie [name] is a 84 y.o. female presents to the ED via [facility] acute care after patient failed when she tripped on a curb at her condominium.  Patient states the only injury she sustained was an injury to her right elbow.  She denies any head injury or loss of consciousness.  Her neighbor is here with her and states she saw the accident and there was no loss of consciousness.  Patient has been ambulatory since her accident.  She states that she and the neighbor tried to control the bleeding to her elbow without any success.  She denies any back pain or lower extremity pain.  She is uncertain of her last tetanus but believes it to be over 10 years.  She rates her pain as a 3 out of 10.  Past Medical History:  Diagnosis Date  . Breast cancer ([place]) 2012   right  . Hypertension   . Thyroid disease     Patient Active Problem List   Diagnosis Date Noted  . Cellulitis 09/24/2016  . Swelling of limb 09/24/2016  . Pain in limb 09/24/2016  . Osteopenia 12/06/2014    Past Surgical History:  Procedure Laterality Date  . ABDOMINAL HYSTERECTOMY    . APPENDECTOMY    . MASTECTOMY Right 2012  . TONSILLECTOMY      Prior to Admission medications   Medication Sig Start Date End Date Taking? Authorizing Provider  amLODipine-benazepril (LOTREL) 5-10 MG capsule Take 1 capsule by mouth daily.  04/01/15   [provider]  aspirin EC 81 MG tablet Take 81 mg by mouth.    [provider]  brimonidine (ALPHAGAN) 0.15 % ophthalmic solution Place 1 drop into the right eye 2 (two) times daily.  11/29/14   [provider]  calcium carbonate (TUMS - DOSED IN MG ELEMENTAL  CALCIUM) 500 MG chewable tablet Chew 2 tablets by mouth daily.    [provider]  Cholecalciferol (VITAMIN D3) 1000 UNITS CAPS Take 1 capsule by mouth daily.     [provider]  furosemide (LASIX) 20 MG tablet Take 20 mg by mouth daily as needed.  03/20/14   [provider]  letrozole (FEMARA) 2.5 MG tablet Take 2.5 mg by mouth.    [provider]  levothyroxine (SYNTHROID, LEVOTHROID) 75 MCG tablet Take 75 mcg by mouth daily before breakfast.  09/10/14   [provider]  Multiple Vitamins-Minerals (PRESERVISION AREDS 2 PO) Take 1 tablet by mouth 2 (two) times daily.    [provider]  Omega-3 1000 MG CAPS Take 2 capsules by mouth once a day.    [provider]  prednisoLONE acetate (PRED FORTE) 1 % ophthalmic suspension Place 1 drop into the left eye daily.  09/10/14   [provider]    Allergies Other and Amoxicillin  Family History  Problem Relation Age of Onset  . Breast cancer Sister     Social History Social History   Tobacco Use  . Smoking status: Former Smoker    Types: Cigarettes    Last attempt to quit: 1967    Years since quitting: 52.8  . Smokeless tobacco: Never Used  Substance Use Topics  . Alcohol use: No  .  Drug use: No    Review of Systems Constitutional: No fever/chills Eyes: No visual changes. ENT: No trauma. Cardiovascular: Denies chest pain. Respiratory: Denies shortness of breath.  Denies rib pain. Gastrointestinal: No abdominal pain.  No nausea, no vomiting.   Musculoskeletal: Positive for right elbow pain.  Negative for cervical or back pain.  No hip pain or pain to the lower extremities. Skin: Positive for laceration to right elbow. Neurological: Negative for headaches, focal weakness or numbness. ___________________________________________   PHYSICAL EXAM:  VITAL SIGNS: ED Triage Vitals  Enc Vitals Group     BP 05/02/18 1158 99/63     Pulse Rate 05/02/18 1158 66      Resp --      Temp 05/02/18 1158 98.1 F (36.7 C)     Temp Source 05/02/18 1158 Oral     SpO2 05/02/18 1158 100 %     Weight --      Height --      Head Circumference --      Peak Flow --      Pain Score 05/02/18 1150 3     Pain Loc --      Pain Edu? --      Excl. in [place]? --    Constitutional: Alert and oriented. Well appearing and in no acute distress.  Patient is cooperative and answers questions appropriately. Eyes: Conjunctivae are normal. PERRL. EOMI.  Head: Atraumatic. Nose: No trauma.  No facial abrasions or discoloration noted. Neck: No stridor.  No cervical tenderness to palpation and range of motion does not produce any pain. Cardiovascular: Normal rate, regular rhythm. Grossly normal heart sounds.  Good peripheral circulation. Respiratory: Normal respiratory effort.  No retractions. Lungs CTAB.  Nontender ribs to palpation. Gastrointestinal: Soft and nontender. No distention.  Bowel sounds normoactive x4 quadrants. Musculoskeletal: Nontender cervical, thoracic or lumbar spine to palpation posteriorly.  There is no evidence of injury or skin discoloration.  Patient is able to move both upper and lower extremities without any difficulty.  There is a laceration to her right elbow posteriorly.  Nontender pelvis to compression.  Patient is able to move lower extremities without any difficulty.  Patient is able to extend and abduct bilateral hips without any difficulties.  Nontender knees and no effusion is present.  Nontender to palpation lower extremities.  No shortening and no rotation was noted. Neurologic:  Normal speech and language. No gross focal neurologic deficits are appreciated.  Skin:  Skin is warm, dry.  There is a 7 cm irregular laceration to the posterior right elbow.  No active bleeding at present.  No foreign bodies are noted. Psychiatric: Mood and affect are normal. Speech and behavior are normal.  ____________________________________________   LABS (all labs  ordered are listed, but only abnormal results are displayed)  Labs Reviewed - No data to display  RADIOLOGY  ED MD interpretation:  X-ray of the right elbow is negative for acute bony injury.  Official radiology report(s): Dg Elbow Complete Right  Result Date: 05/02/2018 CLINICAL DATA:  Post fall, now with right elbow pain including deep laceration about the elbow joint. EXAM: RIGHT ELBOW - COMPLETE 3+ VIEW COMPARISON:  None. FINDINGS: Gauze material is noted about the posterior aspect of the elbow with potential small laceration about the tip of the olecranon process. No definitive subcutaneous emphysema or radiopaque foreign body. No fracture or elbow joint effusion.  Joint spaces appear preserved. IMPRESSION: Suspected laceration about the posterior aspect of the elbow without associated radiopaque foreign  body, fracture or elbow joint effusion. Electronically Signed   By: Sandi Mariscal M.D.   On: 05/02/2018 12:47    ____________________________________________   PROCEDURES  Procedure(s) performed:   Marland KitchenMarland KitchenLaceration Repair Date/Time: 05/02/2018 3:35 PM Performed by: Johnn Hai, PA-C Authorized by: Johnn Hai, PA-C   Consent:    Consent obtained:  Verbal   Consent given by:  Patient   Risks discussed:  Poor wound healing Anesthesia (see MAR for exact dosages):    Anesthesia method:  Local infiltration   Local anesthetic:  Lidocaine 1% w/o epi Laceration details:    Location:  Shoulder/arm   Shoulder/arm location:  R upper arm   Length (cm):  7 Repair type:    Repair type:  Intermediate Exploration:    Contaminated: no   Treatment:    Area cleansed with:  Saline   Amount of cleaning:  Extensive   Irrigation solution:  Sterile saline   Irrigation volume:  50 cc   Irrigation method:  Syringe and pressure wash   Visualized foreign bodies/material removed: yes   Subcutaneous repair:    Suture size:  4-0   Suture material:  Vicryl   Suture technique:  Simple  interrupted   Number of sutures:  3 Skin repair:    Repair method:  Sutures   Suture size:  5-0   Suture material:  Nylon   Suture technique:  Simple interrupted   Number of sutures:  12 Approximation:    Approximation:  Close Post-procedure details:    Dressing:  Bulky dressing and non-adherent dressing   Patient tolerance of procedure:  Tolerated well, no immediate complications    Critical Care performed: No  ____________________________________________   INITIAL IMPRESSION / ASSESSMENT AND PLAN / ED COURSE  As part of my medical decision making, I reviewed the following data within the electronic MEDICAL RECORD NUMBER Notes from prior ED visits and [place] Controlled Substance Database  84 year old female presents to the ED with neighbor after she tripped on the curb in front of her condominium.  There was no injury to her head and no loss of consciousness.  The only injury the patient sustained was an injury to her right elbow and both she and the neighbor tried to control bleeding at home and was unsuccessful.  They initially went to [facility] acute care and was brought to the ED.  Patient denies any other injury.  X-rays were negative for bony injury or foreign body to the right elbow.  Bleeding is under control.  Area was sutured and patient tolerated well.  She was given instructions on how to take care of this and also to see her PCP or return to the ED in 12 to 14 days for suture removal.  She is aware that she should return sooner if any signs of infection.  ____________________________________________   FINAL CLINICAL IMPRESSION(S) / ED DIAGNOSES  Final diagnoses:  Laceration of skin of right elbow, initial encounter  Contusion of right elbow, initial encounter  Fall, initial encounter     ED Discharge Orders    None       Note:  This document was prepared using Dragon voice recognition software and may include unintentional dictation errors.    Johnn Hai, PA-C 05/02/18 1712    Harvest Dark, MD 05/04/18 2238

## 2019-02-20 ENCOUNTER — Other Ambulatory Visit: Payer: Self-pay

## 2019-02-20 ENCOUNTER — Emergency Department
Admission: EM | Admit: 2019-02-20 | Discharge: 2019-02-20 | Disposition: A | Discharge status: Home / Self Care | Payer: Medicare Other | Attending: Emergency Medicine | Admitting: Emergency Medicine

## 2019-02-20 DIAGNOSIS — Z853 Personal history of malignant neoplasm of breast: Secondary | ICD-10-CM | POA: Diagnosis not present

## 2019-02-20 DIAGNOSIS — K625 Hemorrhage of anus and rectum: Secondary | ICD-10-CM | POA: Diagnosis present

## 2019-02-20 DIAGNOSIS — Z9011 Acquired absence of right breast and nipple: Secondary | ICD-10-CM | POA: Diagnosis not present

## 2019-02-20 DIAGNOSIS — Z79899 Other long term (current) drug therapy: Secondary | ICD-10-CM | POA: Insufficient documentation

## 2019-02-20 DIAGNOSIS — Z87891 Personal history of nicotine dependence: Secondary | ICD-10-CM | POA: Insufficient documentation

## 2019-02-20 LAB — COMPREHENSIVE METABOLIC PANEL
ALT: 18 U/L (ref 0–44)
AST: 23 U/L (ref 15–41)
Albumin: 3.8 g/dL (ref 3.5–5.0)
Alkaline Phosphatase: 72 U/L (ref 38–126)
Anion gap: 9 (ref 5–15)
BUN: 24 mg/dL — ABNORMAL HIGH (ref 8–23)
CO2: 21 mmol/L — ABNORMAL LOW (ref 22–32)
Calcium: 8.9 mg/dL (ref 8.9–10.3)
Chloride: 103 mmol/L (ref 98–111)
Creatinine, Ser: 1.02 mg/dL — ABNORMAL HIGH (ref 0.44–1.00)
GFR calc Af Amer: 55 mL/min — ABNORMAL LOW (ref >60)
GFR calc non Af Amer: 47 mL/min — ABNORMAL LOW (ref >60)
Glucose, Bld: 115 mg/dL — ABNORMAL HIGH (ref 70–99)
Potassium: 4 mmol/L (ref 3.5–5.1)
Sodium: 133 mmol/L — ABNORMAL LOW (ref 135–145)
Total Bilirubin: 0.9 mg/dL (ref 0.3–1.2)
Total Protein: 6.2 g/dL — ABNORMAL LOW (ref 6.5–8.1)

## 2019-02-20 LAB — APTT: aPTT: 32 seconds (ref 24–36)

## 2019-02-20 LAB — PROTIME-INR
INR: 1 (ref 0.8–1.2)
Prothrombin Time: 13.5 seconds (ref 11.4–15.2)

## 2019-02-20 LAB — CBC WITH DIFFERENTIAL/PLATELET
Abs Immature Granulocytes: 0.03 10*3/uL (ref 0.00–0.07)
Basophils Absolute: 0 10*3/uL (ref 0.0–0.1)
Basophils Relative: 0 %
Eosinophils Absolute: 0 10*3/uL (ref 0.0–0.5)
Eosinophils Relative: 0 %
HCT: 36.9 % (ref 36.0–46.0)
Hemoglobin: 12.3 g/dL (ref 12.0–15.0)
Immature Granulocytes: 0 %
Lymphocytes Relative: 7 %
Lymphs Abs: 0.8 10*3/uL (ref 0.7–4.0)
MCH: 29.5 pg (ref 26.0–34.0)
MCHC: 33.3 g/dL (ref 30.0–36.0)
MCV: 88.5 fL (ref 80.0–100.0)
Monocytes Absolute: 1.2 10*3/uL — ABNORMAL HIGH (ref 0.1–1.0)
Monocytes Relative: 11 %
Neutro Abs: 9.6 10*3/uL — ABNORMAL HIGH (ref 1.7–7.7)
Neutrophils Relative %: 82 %
Platelets: 198 10*3/uL (ref 150–400)
RBC: 4.17 MIL/uL (ref 3.87–5.11)
RDW: 14.5 % (ref 11.5–15.5)
WBC: 11.7 10*3/uL — ABNORMAL HIGH (ref 4.0–10.5)
nRBC: 0 % (ref 0.0–0.2)

## 2019-02-20 LAB — CBC
HCT: 35.2 % — ABNORMAL LOW (ref 36.0–46.0)
Hemoglobin: 11.5 g/dL — ABNORMAL LOW (ref 12.0–15.0)
MCH: 29.2 pg (ref 26.0–34.0)
MCHC: 32.7 g/dL (ref 30.0–36.0)
MCV: 89.3 fL (ref 80.0–100.0)
Platelets: 183 10*3/uL (ref 150–400)
RBC: 3.94 MIL/uL (ref 3.87–5.11)
RDW: 14.5 % (ref 11.5–15.5)
WBC: 11.7 10*3/uL — ABNORMAL HIGH (ref 4.0–10.5)
nRBC: 0 % (ref 0.0–0.2)

## 2019-02-20 NOTE — ED Provider Notes (Signed)
[facility] Emergency Department Provider Note  ____________________________________________  Time seen: Approximately 10:13 AM  I have reviewed the triage vital signs and the nursing notes.   HISTORY  Chief Complaint GI Bleeding    HPI Carrie [name] is a 85 y.o. female with a history of hypertension and diverticulosis who comes to the ED complaining of red blood in stool since yesterday.  She reports multiple loose bowel movements overnight that appeared bloody.  No black stool.  No abdominal pain nausea or vomiting.  No fevers or chills.  She takes an 81 mg aspirin and antihypertensives but no blood thinners.  Symptoms intermittent without aggravating or alleviating factors.  She denies chest pain shortness of breath fatigue dizziness lightheadedness or syncope.  Reports an episode of diverticular bleed in the past.      Past Medical History:  Diagnosis Date  . Breast cancer ([place]) 2012   right  . Hypertension   . Thyroid disease      Patient Active Problem List   Diagnosis Date Noted  . Cellulitis 09/24/2016  . Swelling of limb 09/24/2016  . Pain in limb 09/24/2016  . Osteopenia 12/06/2014     Past Surgical History:  Procedure Laterality Date  . ABDOMINAL HYSTERECTOMY    . APPENDECTOMY    . MASTECTOMY Right 2012  . TONSILLECTOMY       Prior to Admission medications   Medication Sig Start Date End Date Taking? Authorizing Provider  amLODipine-benazepril (LOTREL) 5-10 MG capsule Take 1 capsule by mouth daily.  04/01/15  Yes [provider]  aspirin EC 81 MG tablet Take 81 mg by mouth.   Yes [provider]  brimonidine (ALPHAGAN) 0.15 % ophthalmic solution Place 1 drop into the right eye 2 (two) times daily.  11/29/14  Yes [provider]  calcium carbonate (TUMS - DOSED IN MG ELEMENTAL CALCIUM) 500 MG chewable tablet Chew 1,000 mg by mouth daily.    Yes [provider]  Cholecalciferol (VITAMIN D3) 1000  UNITS CAPS Take 1 capsule by mouth daily.    Yes [provider]  furosemide (LASIX) 20 MG tablet Take 20 mg by mouth daily as needed for fluid.    Yes [provider]  levothyroxine (SYNTHROID, LEVOTHROID) 75 MCG tablet Take 75 mcg by mouth daily before breakfast.  09/10/14  Yes [provider]  meloxicam (MOBIC) 7.5 MG tablet Take 7.5 mg by mouth daily. 12/26/18  Yes [provider]  Multiple Vitamins-Minerals (PRESERVISION AREDS 2 PO) Take 1 tablet by mouth 2 (two) times daily.   Yes [provider]  Omega-3 1000 MG CAPS Take 2,000 mg by mouth daily.    Yes [provider]  prednisoLONE acetate (PRED FORTE) 1 % ophthalmic suspension Place 1 drop into the left eye daily.  09/10/14  Yes [provider]     Allergies Other and Amoxicillin   Family History  Problem Relation Age of Onset  . Breast cancer Sister     Social History Social History   Tobacco Use  . Smoking status: Former Smoker    Types: Cigarettes    Quit date: 1967    Years since quitting: 53.6  . Smokeless tobacco: Never Used  Substance Use Topics  . Alcohol use: No  . Drug use: No    Review of Systems  Constitutional:   No fever or chills.  ENT:   No sore throat. No rhinorrhea. Cardiovascular:   No chest pain or syncope. Respiratory:  No dyspnea or cough. Gastrointestinal:   Negative for abdominal pain, vomiting and diarrhea.  Musculoskeletal:   Negative for focal pain or swelling All other systems reviewed and are negative except as documented above in ROS and HPI.  ____________________________________________   PHYSICAL EXAM:  VITAL SIGNS: ED Triage Vitals [02/20/19 0707]  Enc Vitals Group     BP 112/63     Pulse Rate 71     Resp 17     Temp 98.3 F (36.8 C)     Temp Source Oral     SpO2 97 %     Weight 113 lb (51.3 kg)     Height 5\' 2"  (1.575 m)     Head Circumference      Peak Flow      Pain Score 0     Pain Loc      Pain Edu?       Excl. in [place]?     Vital signs reviewed, nursing assessments reviewed.   Constitutional:   Alert and oriented. Non-toxic appearance. Eyes:   Conjunctivae are normal. EOMI. PERRL. ENT      Head:   Normocephalic and atraumatic.      Nose:   No congestion/rhinnorhea.       Mouth/Throat:   MMM, no pharyngeal erythema. No peritonsillar mass.       Neck:   No meningismus. Full ROM. Hematological/Lymphatic/Immunilogical:   No cervical lymphadenopathy. Cardiovascular:   RRR. Symmetric bilateral radial and DP pulses.  No murmurs. Cap refill less than 2 seconds. Respiratory:   Normal respiratory effort without tachypnea/retractions. Breath sounds are clear and equal bilaterally. No wheezes/rales/rhonchi. Gastrointestinal:   Soft and nontender. Non distended. There is no CVA tenderness.  No rebound, rigidity, or guarding.  Rectal exam performed with nurse Mickel Baas at bedside, no appreciable hemorrhoids.  Nontender.  Small amount of thin red blood without stool on exam, strongly Hemoccult positive  Musculoskeletal:   Normal range of motion in all extremities. No joint effusions.  No lower extremity tenderness.  No edema. Neurologic:   Normal speech and language.  Motor grossly intact. No acute focal neurologic deficits are appreciated.  Skin:    Skin is warm, dry and intact. No rash noted.  No petechiae, purpura, or bullae.  ____________________________________________    LABS (pertinent positives/negatives) (all labs ordered are listed, but only abnormal results are displayed) Labs Reviewed  COMPREHENSIVE METABOLIC PANEL - Abnormal; Notable for the following components:      Result Value   Sodium 133 (*)    CO2 21 (*)    Glucose, Bld 115 (*)    BUN 24 (*)    Creatinine, Ser 1.02 (*)    Total Protein 6.2 (*)    GFR calc non Af Amer 47 (*)    GFR calc Af Amer 55 (*)    All other components within normal limits  CBC WITH DIFFERENTIAL/PLATELET - Abnormal; Notable for the following  components:   WBC 11.7 (*)    Neutro Abs 9.6 (*)    Monocytes Absolute 1.2 (*)    All other components within normal limits  CBC - Abnormal; Notable for the following components:   WBC 11.7 (*)    Hemoglobin 11.5 (*)    HCT 35.2 (*)    All other components within normal limits  PROTIME-INR  APTT   ____________________________________________   EKG    ____________________________________________    RADIOLOGY  No results found.  ____________________________________________   PROCEDURES Procedures  ____________________________________________  CLINICAL IMPRESSION / ASSESSMENT AND PLAN / ED COURSE  Medications ordered in the ED: Medications - No data to display  Pertinent labs & imaging results that were available during my care of the patient were reviewed by me and considered in my medical decision making (see chart for details).  Carrie [name] was evaluated in Emergency Department on 02/20/2019 for the symptoms described in the history of present illness. She was evaluated in the context of the global COVID-19 pandemic, which necessitated consideration that the patient might be at risk for infection with the SARS-CoV-2 virus that causes COVID-19. Institutional protocols and algorithms that pertain to the evaluation of patients at risk for COVID-19 are in a state of rapid change based on information released by regulatory bodies including the CDC and federal and state organizations. These policies and algorithms were followed during the patient's care in the ED.   Patient presents with episodes of bright red blood per rectum.  No evidence of hernia.  No active hemorrhage on exam.  Suspect a degree of diverticular bleed which may have resolved on its own.  Vital signs are normal, no signs or symptoms of shock.  Initial labs are all baseline and unremarkable.  I will recheck a CBC after 2 hours, monitor the patient for further bloody bowel movements in the ED.  If no  further bloody bowel movements and CBC is not changing over time, patient can be stable for discharge home and follow-up with gastroenterology if she feels comfortable.  Clinical Course as of Feb 19 1433  Mon Feb 20, 2019  1432 Patient had a bowel movement with a very small amount of blood on the toilet paper, not in the toilet.  Possibly she has caused a small rupture of an internal hemorrhoid versus diverticular bleed which is controlled and spontaneously resolving.   [PS]    Clinical Course User Index [PS] Carrie Mew, MD     ----------------------------------------- 2:34 PM on 02/20/2019 -----------------------------------------  CBC essentially stable, vital signs normal, remains asymptomatic.  ____________________________________________   FINAL CLINICAL IMPRESSION(S) / ED DIAGNOSES    Final diagnoses:  Rectal bleed     ED Discharge Orders    None      Portions of this note were generated with dragon dictation software. Dictation errors may occur despite best attempts at proofreading.   Carrie Mew, MD 02/20/19 1434

## 2019-02-20 NOTE — ED Notes (Signed)
This RN at bedside to assist MD with rectal exam. Patient tolerated well.

## 2019-02-20 NOTE — ED Notes (Signed)
Patient reports red blood in stool since yesterday. Denies abdominal pain. History of diverticulosis

## 2019-02-20 NOTE — ED Notes (Signed)
Patient assisted to use restroom. Small amount of blood noted in patient's underwear as well as mixed with urine in toilet. Patient given clean brief to wear.

## 2019-02-20 NOTE — ED Notes (Signed)
Patient to lobby in wheelchair. Verbalized understanding of discharge instructions and follow-up care.

## 2019-02-20 NOTE — Discharge Instructions (Signed)
Continue taking all your medications and follow a soft diet and drink lots of fluids over the next few days.  Your bleeding should completely resolve by tomorrow.  Call your gastroenterologist for follow-up, but return to the emergency department if you notice increased frequency or volume of bleeding or have other concerning symptoms such as chest pain shortness of breath dizziness or severe fatigue.

## 2019-02-20 NOTE — ED Notes (Signed)
Patient ambulated to use restroom. Small amount of orange/red blood noted to toilet tissue, but no frank blood noted in toilet. No blood noted in brief. MD made aware.

## 2019-02-20 NOTE — ED Triage Notes (Signed)
Pt c/o having diarrhea that started mid day yesterday and having some blood in the stools. Mild abd pain. Denies N/V.Marland Kitchen

## 2020-09-20 ENCOUNTER — Emergency Department: Payer: Medicare Other

## 2020-09-20 ENCOUNTER — Encounter: Payer: Self-pay | Admitting: Emergency Medicine

## 2020-09-20 ENCOUNTER — Other Ambulatory Visit: Payer: Self-pay

## 2020-09-20 ENCOUNTER — Inpatient Hospital Stay
Admission: EM | Admit: 2020-09-20 | Discharge: 2020-09-24 | DRG: 308 | Disposition: A | Discharge status: Home / Self Care | Payer: Medicare Other | Attending: Hospitalist | Admitting: Hospitalist

## 2020-09-20 DIAGNOSIS — Z853 Personal history of malignant neoplasm of breast: Secondary | ICD-10-CM | POA: Diagnosis not present

## 2020-09-20 DIAGNOSIS — N1832 Chronic kidney disease, stage 3b: Secondary | ICD-10-CM | POA: Diagnosis present

## 2020-09-20 DIAGNOSIS — I130 Hypertensive heart and chronic kidney disease with heart failure and stage 1 through stage 4 chronic kidney disease, or unspecified chronic kidney disease: Secondary | ICD-10-CM | POA: Diagnosis present

## 2020-09-20 DIAGNOSIS — Z87891 Personal history of nicotine dependence: Secondary | ICD-10-CM

## 2020-09-20 DIAGNOSIS — Z9011 Acquired absence of right breast and nipple: Secondary | ICD-10-CM

## 2020-09-20 DIAGNOSIS — R339 Retention of urine, unspecified: Secondary | ICD-10-CM

## 2020-09-20 DIAGNOSIS — E871 Hypo-osmolality and hyponatremia: Secondary | ICD-10-CM | POA: Diagnosis present

## 2020-09-20 DIAGNOSIS — N179 Acute kidney failure, unspecified: Secondary | ICD-10-CM | POA: Diagnosis present

## 2020-09-20 DIAGNOSIS — R7989 Other specified abnormal findings of blood chemistry: Secondary | ICD-10-CM | POA: Diagnosis present

## 2020-09-20 DIAGNOSIS — D72829 Elevated white blood cell count, unspecified: Secondary | ICD-10-CM | POA: Diagnosis present

## 2020-09-20 DIAGNOSIS — I4891 Unspecified atrial fibrillation: Secondary | ICD-10-CM | POA: Diagnosis not present

## 2020-09-20 DIAGNOSIS — R197 Diarrhea, unspecified: Secondary | ICD-10-CM | POA: Diagnosis present

## 2020-09-20 DIAGNOSIS — R001 Bradycardia, unspecified: Secondary | ICD-10-CM | POA: Diagnosis present

## 2020-09-20 DIAGNOSIS — I5033 Acute on chronic diastolic (congestive) heart failure: Secondary | ICD-10-CM | POA: Diagnosis present

## 2020-09-20 DIAGNOSIS — Z9109 Other allergy status, other than to drugs and biological substances: Secondary | ICD-10-CM

## 2020-09-20 DIAGNOSIS — Z20822 Contact with and (suspected) exposure to covid-19: Secondary | ICD-10-CM | POA: Diagnosis present

## 2020-09-20 DIAGNOSIS — E039 Hypothyroidism, unspecified: Secondary | ICD-10-CM | POA: Diagnosis present

## 2020-09-20 DIAGNOSIS — R0602 Shortness of breath: Secondary | ICD-10-CM

## 2020-09-20 DIAGNOSIS — I444 Left anterior fascicular block: Principal | ICD-10-CM | POA: Diagnosis present

## 2020-09-20 DIAGNOSIS — K761 Chronic passive congestion of liver: Secondary | ICD-10-CM | POA: Diagnosis present

## 2020-09-20 DIAGNOSIS — E860 Dehydration: Secondary | ICD-10-CM | POA: Diagnosis present

## 2020-09-20 DIAGNOSIS — K8020 Calculus of gallbladder without cholecystitis without obstruction: Secondary | ICD-10-CM | POA: Diagnosis present

## 2020-09-20 DIAGNOSIS — Z803 Family history of malignant neoplasm of breast: Secondary | ICD-10-CM

## 2020-09-20 DIAGNOSIS — Z880 Allergy status to penicillin: Secondary | ICD-10-CM

## 2020-09-20 DIAGNOSIS — Z66 Do not resuscitate: Secondary | ICD-10-CM | POA: Diagnosis present

## 2020-09-20 DIAGNOSIS — Z79899 Other long term (current) drug therapy: Secondary | ICD-10-CM

## 2020-09-20 DIAGNOSIS — R112 Nausea with vomiting, unspecified: Secondary | ICD-10-CM | POA: Diagnosis present

## 2020-09-20 DIAGNOSIS — Z791 Long term (current) use of non-steroidal anti-inflammatories (NSAID): Secondary | ICD-10-CM

## 2020-09-20 DIAGNOSIS — J9601 Acute respiratory failure with hypoxia: Secondary | ICD-10-CM | POA: Diagnosis not present

## 2020-09-20 DIAGNOSIS — I350 Nonrheumatic aortic (valve) stenosis: Secondary | ICD-10-CM | POA: Diagnosis present

## 2020-09-20 DIAGNOSIS — H548 Legal blindness, as defined in USA: Secondary | ICD-10-CM | POA: Diagnosis present

## 2020-09-20 DIAGNOSIS — R7401 Elevation of levels of liver transaminase levels: Secondary | ICD-10-CM

## 2020-09-20 DIAGNOSIS — R739 Hyperglycemia, unspecified: Secondary | ICD-10-CM | POA: Diagnosis present

## 2020-09-20 DIAGNOSIS — I248 Other forms of acute ischemic heart disease: Secondary | ICD-10-CM | POA: Diagnosis present

## 2020-09-20 DIAGNOSIS — Z7982 Long term (current) use of aspirin: Secondary | ICD-10-CM

## 2020-09-20 DIAGNOSIS — Z79890 Hormone replacement therapy (postmenopausal): Secondary | ICD-10-CM

## 2020-09-20 LAB — CBC WITH DIFFERENTIAL/PLATELET
Abs Immature Granulocytes: 0.04 10*3/uL (ref 0.00–0.07)
Basophils Absolute: 0 10*3/uL (ref 0.0–0.1)
Basophils Relative: 0 %
Eosinophils Absolute: 0 10*3/uL (ref 0.0–0.5)
Eosinophils Relative: 0 %
HCT: 37.6 % (ref 36.0–46.0)
Hemoglobin: 12.2 g/dL (ref 12.0–15.0)
Immature Granulocytes: 0 %
Lymphocytes Relative: 17 %
Lymphs Abs: 1.9 10*3/uL (ref 0.7–4.0)
MCH: 29.7 pg (ref 26.0–34.0)
MCHC: 32.4 g/dL (ref 30.0–36.0)
MCV: 91.5 fL (ref 80.0–100.0)
Monocytes Absolute: 0.8 10*3/uL (ref 0.1–1.0)
Monocytes Relative: 8 %
Neutro Abs: 8.1 10*3/uL — ABNORMAL HIGH (ref 1.7–7.7)
Neutrophils Relative %: 75 %
Platelets: 188 10*3/uL (ref 150–400)
RBC: 4.11 MIL/uL (ref 3.87–5.11)
RDW: 14.8 % (ref 11.5–15.5)
WBC: 10.9 10*3/uL — ABNORMAL HIGH (ref 4.0–10.5)
nRBC: 0 % (ref 0.0–0.2)

## 2020-09-20 LAB — COMPREHENSIVE METABOLIC PANEL
ALT: 222 U/L — ABNORMAL HIGH (ref 0–44)
AST: 199 U/L — ABNORMAL HIGH (ref 15–41)
Albumin: 4.1 g/dL (ref 3.5–5.0)
Alkaline Phosphatase: 94 U/L (ref 38–126)
Anion gap: 8 (ref 5–15)
BUN: 43 mg/dL — ABNORMAL HIGH (ref 8–23)
CO2: 22 mmol/L (ref 22–32)
Calcium: 9.2 mg/dL (ref 8.9–10.3)
Chloride: 97 mmol/L — ABNORMAL LOW (ref 98–111)
Creatinine, Ser: 1.5 mg/dL — ABNORMAL HIGH (ref 0.44–1.00)
GFR, Estimated: 32 mL/min — ABNORMAL LOW (ref >60)
Glucose, Bld: 171 mg/dL — ABNORMAL HIGH (ref 70–99)
Potassium: 5.8 mmol/L — ABNORMAL HIGH (ref 3.5–5.1)
Sodium: 127 mmol/L — ABNORMAL LOW (ref 135–145)
Total Bilirubin: 0.9 mg/dL (ref 0.3–1.2)
Total Protein: 6.6 g/dL (ref 6.5–8.1)

## 2020-09-20 LAB — GLUCOSE, CAPILLARY
Glucose-Capillary: 114 mg/dL — ABNORMAL HIGH (ref 70–99)
Glucose-Capillary: 163 mg/dL — ABNORMAL HIGH (ref 70–99)

## 2020-09-20 LAB — TROPONIN I (HIGH SENSITIVITY)
Troponin I (High Sensitivity): 36 ng/L — ABNORMAL HIGH (ref <18)
Troponin I (High Sensitivity): 47 ng/L — ABNORMAL HIGH (ref <18)

## 2020-09-20 LAB — SALICYLATE LEVEL: Salicylate Lvl: <7 mg/dL — ABNORMAL LOW (ref 7.0–30.0)

## 2020-09-20 LAB — T4, FREE: Free T4: 1.19 ng/dL — ABNORMAL HIGH (ref 0.61–1.12)

## 2020-09-20 LAB — HEMOGLOBIN A1C
Hgb A1c MFr Bld: 5.4 % (ref 4.8–5.6)
Mean Plasma Glucose: 108.28 mg/dL

## 2020-09-20 LAB — MAGNESIUM: Magnesium: 2.6 mg/dL — ABNORMAL HIGH (ref 1.7–2.4)

## 2020-09-20 LAB — RESP PANEL BY RT-PCR (FLU A&B, COVID) ARPGX2
Influenza A by PCR: NEGATIVE
Influenza B by PCR: NEGATIVE
SARS Coronavirus 2 by RT PCR: NEGATIVE

## 2020-09-20 LAB — TSH: TSH: 5.869 u[IU]/mL — ABNORMAL HIGH (ref 0.350–4.500)

## 2020-09-20 LAB — PHOSPHORUS: Phosphorus: 4.9 mg/dL — ABNORMAL HIGH (ref 2.5–4.6)

## 2020-09-20 MED ORDER — ENOXAPARIN SODIUM 40 MG/0.4ML ~~LOC~~ SOLN: 40.0000 mg | SUBCUTANEOUS | Status: DC

## 2020-09-20 MED ORDER — FUROSEMIDE 20 MG PO TABS: 20.0000 mg | ORAL_TABLET | Freq: Every day | ORAL | Status: DC | PRN

## 2020-09-20 MED ORDER — PRESERVISION AREDS 2 PO CAPS: ORAL_CAPSULE | Freq: Two times a day (BID) | ORAL | Status: DC

## 2020-09-20 MED ORDER — BRIMONIDINE TARTRATE 0.15 % OP SOLN
1.0000 [drp] | Freq: Two times a day (BID) | OPHTHALMIC | Status: DC
  Administered 2020-09-20 – 2020-09-24 (×8): 1 [drp] via OPHTHALMIC
  Filled 2020-09-20: qty 5

## 2020-09-20 MED ORDER — ATROPINE SULFATE 1 MG/10ML IJ SOSY
0.5000 mg | PREFILLED_SYRINGE | Freq: Once | INTRAMUSCULAR | Status: AC
  Administered 2020-09-20: 0.5 mg via INTRAVENOUS
  Filled 2020-09-20: qty 10

## 2020-09-20 MED ORDER — INSULIN ASPART 100 UNIT/ML ~~LOC~~ SOLN
0.0000 [IU] | Freq: Three times a day (TID) | SUBCUTANEOUS | Status: DC
  Administered 2020-09-21 – 2020-09-22 (×2): 1 [IU] via SUBCUTANEOUS
  Filled 2020-09-20 (×2): qty 1

## 2020-09-20 MED ORDER — OMEGA-3 1000 MG PO CAPS: 2000.0000 mg | ORAL_CAPSULE | Freq: Every day | ORAL | Status: DC

## 2020-09-20 MED ORDER — VITAMIN D3 25 MCG (1000 UNIT) PO TABS
1000.0000 [IU] | ORAL_TABLET | Freq: Every day | ORAL | Status: DC
  Administered 2020-09-21 – 2020-09-24 (×4): 1000 [IU] via ORAL
  Filled 2020-09-20 (×6): qty 1

## 2020-09-20 MED ORDER — SODIUM ZIRCONIUM CYCLOSILICATE 10 G PO PACK
10.0000 g | PACK | Freq: Once | ORAL | Status: AC
  Administered 2020-09-20: 10 g via ORAL
  Filled 2020-09-20 (×2): qty 1

## 2020-09-20 MED ORDER — ENOXAPARIN SODIUM 30 MG/0.3ML ~~LOC~~ SOLN
30.0000 mg | SUBCUTANEOUS | Status: DC
  Administered 2020-09-20 – 2020-09-23 (×4): 30 mg via SUBCUTANEOUS
  Filled 2020-09-20 (×5): qty 0.3

## 2020-09-20 MED ORDER — SODIUM CHLORIDE 0.9 % IV BOLUS
500.0000 mL | Freq: Once | INTRAVENOUS | Status: AC
  Administered 2020-09-20: 500 mL via INTRAVENOUS

## 2020-09-20 MED ORDER — PREDNISOLONE ACETATE 1 % OP SUSP
1.0000 [drp] | Freq: Every day | OPHTHALMIC | Status: DC
  Administered 2020-09-21 – 2020-09-24 (×4): 1 [drp] via OPHTHALMIC
  Filled 2020-09-20: qty 1

## 2020-09-20 MED ORDER — OMEGA-3-ACID ETHYL ESTERS 1 G PO CAPS
1.0000 g | ORAL_CAPSULE | Freq: Two times a day (BID) | ORAL | Status: DC
  Administered 2020-09-20 – 2020-09-24 (×8): 1 g via ORAL
  Filled 2020-09-20 (×9): qty 1

## 2020-09-20 MED ORDER — OCUVITE-LUTEIN PO CAPS
1.0000 | ORAL_CAPSULE | Freq: Every day | ORAL | Status: DC
  Administered 2020-09-21 – 2020-09-24 (×4): 1 via ORAL
  Filled 2020-09-20 (×4): qty 1

## 2020-09-20 MED ORDER — LEVOTHYROXINE SODIUM 50 MCG PO TABS
75.0000 ug | ORAL_TABLET | Freq: Every day | ORAL | Status: DC
  Administered 2020-09-21 – 2020-09-24 (×4): 75 ug via ORAL
  Filled 2020-09-20 (×3): qty 1

## 2020-09-20 NOTE — ED Provider Notes (Signed)
[facility] Emergency Department Provider Note  ____________________________________________  Time seen: Approximately 1:08 PM  I have reviewed the triage vital signs and the nursing notes.   HISTORY  Chief Complaint Bradycardia    HPI Carrie [name] is a 87 y.o. female with a history of hypertension, thyroid disease, breast cancer status post right mastectomy who comes ED  complaining of dizziness with nausea vomiting and diarrhea last night.  Also notes decreased urine output.  No chest pain or shortness of breath.  She went to primary care who noticed that she was bradycardic with a heart rate in the 30s and sent her to the ED.  Denies syncope or trauma.  She has been compliant with her medications.   Past Medical History:  Diagnosis Date  . Breast cancer ([place]) 2012   right  . Hypertension   . Thyroid disease      Patient Active Problem List   Diagnosis Date Noted  . Cellulitis 09/24/2016  . Swelling of limb 09/24/2016  . Pain in limb 09/24/2016  . Osteopenia 12/06/2014     Past Surgical History:  Procedure Laterality Date  . ABDOMINAL HYSTERECTOMY    . APPENDECTOMY    . MASTECTOMY Right 2012  . TONSILLECTOMY       Prior to Admission medications   Medication Sig Start Date End Date Taking? Authorizing Provider  amLODipine-benazepril (LOTREL) 5-10 MG capsule Take 1 capsule by mouth daily.  04/01/15   [provider]  aspirin EC 81 MG tablet Take 81 mg by mouth.    [provider]  brimonidine (ALPHAGAN) 0.15 % ophthalmic solution Place 1 drop into the right eye 2 (two) times daily.  11/29/14   [provider]  calcium carbonate (TUMS - DOSED IN MG ELEMENTAL CALCIUM) 500 MG chewable tablet Chew 1,000 mg by mouth daily.     [provider]  Cholecalciferol (VITAMIN D3) 1000 UNITS CAPS Take 1 capsule by mouth daily.     [provider]  furosemide (LASIX) 20 MG tablet Take 20 mg by mouth daily as  needed for fluid.     [provider]  levothyroxine (SYNTHROID, LEVOTHROID) 75 MCG tablet Take 75 mcg by mouth daily before breakfast.  09/10/14   [provider]  meloxicam (MOBIC) 7.5 MG tablet Take 7.5 mg by mouth daily. 12/26/18   [provider]  Multiple Vitamins-Minerals (PRESERVISION AREDS 2 PO) Take 1 tablet by mouth 2 (two) times daily.    [provider]  Omega-3 1000 MG CAPS Take 2,000 mg by mouth daily.     [provider]  prednisoLONE acetate (PRED FORTE) 1 % ophthalmic suspension Place 1 drop into the left eye daily.  09/10/14   [provider]     Allergies Other and Amoxicillin   Family History  Problem Relation Age of Onset  . Breast cancer Sister     Social History Social History   Tobacco Use  . Smoking status: Former Smoker    Types: Cigarettes    Quit date: 1967    Years since quitting: 55.2  . Smokeless tobacco: Never Used  Substance Use Topics  . Alcohol use: No  . Drug use: No    Review of Systems  Constitutional:   No fever or chills.  Positive fatigue ENT:   No sore throat. No rhinorrhea. Cardiovascular:   No chest pain or syncope. Respiratory:   No dyspnea or cough. Gastrointestinal:   Negative for abdominal pain, positive vomiting  and watery diarrhea.  Musculoskeletal:   Negative for focal pain or swelling All other systems reviewed and are negative except as documented above in ROS and HPI.  ____________________________________________   PHYSICAL EXAM:  VITAL SIGNS: ED Triage Vitals  Enc Vitals Group     BP 09/20/20 1137 (!) 124/59     Pulse Rate 09/20/20 1137 (!) 43     Resp 09/20/20 1137 19     Temp 09/20/20 1137 97.7 F (36.5 C)     Temp Source 09/20/20 1137 Oral     SpO2 09/20/20 1137 94 %     Weight 09/20/20 1134 100 lb (45.4 kg)     Height 09/20/20 1134 5\' 2"  (1.575 m)     Head Circumference --      Peak Flow --      Pain Score 09/20/20 1133 3     Pain Loc --      Pain  Edu? --      Excl. in [place]? --     Vital signs reviewed, nursing assessments reviewed.   Constitutional:   Alert and oriented. Non-toxic appearance. Eyes:   Conjunctivae are normal. EOMI. left eye cataract ENT      Head:   Normocephalic and atraumatic.      Nose:   Normal      Mouth/Throat:   Normal      Neck:   No meningismus. Full ROM. Hematological/Lymphatic/Immunilogical:   No cervical lymphadenopathy. Cardiovascular:   Bradycardia heart rate 30-45. Symmetric bilateral radial and DP pulses.  No murmurs. Cap refill less than 2 seconds. Respiratory:   Normal respiratory effort without tachypnea/retractions. Breath sounds are clear and equal bilaterally. No wheezes/rales/rhonchi. Gastrointestinal:   Soft and nontender. Non distended. There is no CVA tenderness.  No rebound, rigidity, or guarding.  Musculoskeletal:   Normal range of motion in all extremities. No joint effusions.  No lower extremity tenderness.  No edema. Neurologic:   Normal speech and language.  Motor grossly intact. No acute focal neurologic deficits are appreciated.  Skin:    Skin is warm, dry and intact. No rash noted.  No petechiae, purpura, or bullae.  ____________________________________________    LABS (pertinent positives/negatives) (all labs ordered are listed, but only abnormal results are displayed) Labs Reviewed  COMPREHENSIVE METABOLIC PANEL - Abnormal; Notable for the following components:      Result Value   Sodium 127 (*)    Potassium 5.8 (*)    Chloride 97 (*)    Glucose, Bld 171 (*)    BUN 43 (*)    Creatinine, Ser 1.50 (*)    AST 199 (*)    ALT 222 (*)    GFR, Estimated 32 (*)    All other components within normal limits  CBC WITH DIFFERENTIAL/PLATELET - Abnormal; Notable for the following components:   WBC 10.9 (*)    Neutro Abs 8.1 (*)    All other components within normal limits  SALICYLATE LEVEL - Abnormal; Notable for the following components:   Salicylate Lvl <3.8 (*)    All  other components within normal limits  TSH - Abnormal; Notable for the following components:   TSH 5.869 (*)    All other components within normal limits  T4, FREE - Abnormal; Notable for the following components:   Free T4 1.19 (*)    All other components within normal limits  MAGNESIUM - Abnormal; Notable for the following components:   Magnesium 2.6 (*)    All other components within normal  limits  PHOSPHORUS - Abnormal; Notable for the following components:   Phosphorus 4.9 (*)    All other components within normal limits  TROPONIN I (HIGH SENSITIVITY) - Abnormal; Notable for the following components:   Troponin I (High Sensitivity) 36 (*)    All other components within normal limits  RESP PANEL BY RT-PCR (FLU A&B, COVID) [id number]  TROPONIN I (HIGH SENSITIVITY)   ____________________________________________   EKG  Interpreted by me Undetermined rhythm, most likely junctional, rate of 44 in bigeminy with a narrow complex, no distinguishable P waves.  Left axis, poor R wave progression, slight lateral T wave inversions suggestive of ischemia.  Repeat EKG interpreted by me, Again suspect junctional rhythm, rate of 38, left axis, poor R wave progression.  Increased T wave inversions in the lateral leads.  ____________________________________________    [id number]  DG Chest Portable 1 View  Result Date: 09/20/2020 CLINICAL DATA:  Weakness, bradycardia, nausea, vomiting and diarrhea. EXAM: PORTABLE CHEST 1 VIEW COMPARISON:  None. FINDINGS: Trachea is midline. Heart is enlarged. Thoracic aorta is calcified. Defibrillator screw pad overlies the upper left chest. Streaky densities in the lung bases. There may be a tiny left pleural effusion. No pneumothorax. High riding humeral heads bilaterally with degenerative changes. IMPRESSION: 1. Mild bibasilar atelectasis, right greater than left. 2. Possible tiny left pleural effusion. 3.  Aortic atherosclerosis (ICD10-I70.0). Electronically  Signed   By: Lorin Picket M.D.   On: 09/20/2020 12:09    ____________________________________________   PROCEDURES .Critical Care Performed by: Carrie Mew, MD Authorized by: Carrie Mew, MD   Critical care provider statement:    Critical care time (minutes):  35   Critical care time was exclusive of:  Separately billable procedures and treating other patients   Critical care was necessary to treat or prevent imminent or life-threatening deterioration of the following conditions:  Cardiac failure, circulatory failure and shock   Critical care was time spent personally by me on the following activities:  Development of treatment plan with patient or surrogate, discussions with consultants, evaluation of patient's response to treatment, examination of patient, obtaining history from patient or surrogate, ordering and performing treatments and interventions, ordering and review of laboratory studies, ordering and review of radiographic studies, pulse oximetry, re-evaluation of patient's condition and review of old charts    ____________________________________________  DIFFERENTIAL DIAGNOSIS   Electrolyte abnormality, dehydration, ACS, idiopathic heart block  CLINICAL IMPRESSION / ASSESSMENT AND PLAN / ED COURSE  Medications ordered in the ED: Medications  atropine 1 MG/10ML injection 0.5 mg (0.5 mg Intravenous Given 09/20/20 1157)  sodium chloride 0.9 % bolus 500 mL (0 mLs Intravenous Stopped 09/20/20 1259)    Pertinent labs & imaging results that were available during my care of the patient were reviewed by me and considered in my medical decision making (see chart for details).  Carrie [name] was evaluated in Emergency Department on 09/20/2020 for the symptoms described in the history of present illness. She was evaluated in the context of the global COVID-19 pandemic, which necessitated consideration that the patient might be at risk for infection with the SARS-CoV-2  virus that causes COVID-19. Institutional protocols and algorithms that pertain to the evaluation of patients at risk for COVID-19 are in a state of rapid change based on information released by regulatory bodies including the CDC and federal and state organizations. These policies and algorithms were followed during the patient's care in the ED.     Clinical Course as of 09/20/20 1440  Fri  Sep 20, 2020  1144 Patient presents with bradycardia, heart rate 35-45.  Blood pressure is normal at 125/60.  Denies chest pain or shortness of breath.  Mental status is normal.  Patient reiterates CODE STATUS is DNR, and she is reluctant to be hospitalized.  With seeing the heart rate in the 30s, will give small dose of atropine.  Will give IV fluids, check labs and chest x-ray. [PS]  1252 Labs show mild hyponatremia, mild hyperkalemia.  Giving IV fluids for hydration.  Initial troponin only very slightly elevated.  Awaiting magnesium and TSH.  Patient mains bradycardic, borderline blood pressure.  She refuses pacing right now.  She states that she would like to be discharged home and does not want to be admitted to the hospital, and she is alert and oriented and has medical decision-making capacity.  She understands that she may die over the weekend if she is at home without continuous monitoring and the potential for immediate medical care should her condition deteriorate, and she is okay with this, in keeping with her stated DNR status.  Will contact Dr. Nehemiah Massed, her cardiologist. [PS]  [phone] Presentation discussed with cardiology Dr. Nehemiah Massed.  He advises this is very likely the evolution of her aortic stenosis disease process affecting the AV node and causing complete heart block.  He will come evaluate and discuss with her. [PS]    Clinical Course User Index [PS] Carrie Mew, MD    ----------------------------------------- 2:39 PM on 09/20/2020 -----------------------------------------  Patient seen  by Dr. Nehemiah Massed who recommends Micra pacemaker insertion to which patient agrees.  Will discuss with hospitalist for further management in the meantime.   ____________________________________________   FINAL CLINICAL IMPRESSION(S) / ED DIAGNOSES    Final diagnoses:  Symptomatic bradycardia  Aortic valve stenosis, etiology of cardiac valve disease unspecified     ED Discharge Orders    None      Portions of this note were generated with dragon dictation software. Dictation errors may occur despite best attempts at proofreading.   Carrie Mew, MD 09/20/20 1440

## 2020-09-20 NOTE — Progress Notes (Signed)
PHARMACIST - PHYSICIAN COMMUNICATION  CONCERNING:  Enoxaparin (Lovenox) for DVT Prophylaxis    RECOMMENDATION: Patient was prescribed enoxaprin 40mg  q24 hours for VTE prophylaxis.   Filed Weights   09/20/20 1134  Weight: 45.4 kg (100 lb)    Body mass index is 18.29 kg/m.  Estimated Creatinine Clearance: 16.1 mL/min (A) (by C-G formula based on SCr of 1.5 mg/dL (H)).   Patient is candidate for enoxaparin 30mg  every 24 hours based on CrCl <70ml/min or Weight <45kg  DESCRIPTION: Pharmacy has adjusted enoxaparin dose per [facility] policy.  Patient is now receiving enoxaparin 30 mg every 24 hours    Berta Minor, PharmD Clinical Pharmacist  09/20/2020 4:09 PM

## 2020-09-20 NOTE — H&P (Addendum)
History and Physical  Carrie [name] NGE:[id number] DOB: 08-Nov-1922 DOA: 09/20/2020  Referring physician: Dr. Joni Fears, [place] PCP: Marinda Elk, MD  Outpatient Specialists: Cardiology. Patient coming from: Home.  Chief Complaint: Sent from PCPs office due to symptomatic bradycardia.  HPI: Carrie [name] is a 87 y.o. female with medical history significant for severe aortic stenosis, has declined aortic valve replacement in the past, essential hypertension, hypothyroidism, who presented to [facility] ED at her PCPs recommendation due to symptomatic bradycardia.  Patient initially presented to her PCPs office due to sudden onset of diarrhea last night associated with nausea and vomiting this morning.  She noted that she had mild past urine since last night therefore she presented to her PCPs office for further evaluation.  While she was there 10 vital signs revealed heart rate in the 30s and 40s.  She felt dizzy therefore was sent to the ED for further evaluation.  Cardiology Dr. Tamala Ser was contacted who reviewed EKG and recommended admission for pacemaker placement.  She denies having any syncopal episodes.  She admits to intermittent chest discomfort chest tightness for the past 2 to 3 months.    ED Course: Afebrile, BP 126/82, pulse 39, respiratory 16, O2 sat 100% on room air.  Lab studies remarkable for serum sodium 127, serum potassium 5.8, serum glucose 171, creatinine 1.50, phosphorus 4.9, magnesium 2.6, AST 199, ALT 222,, alkaline phosphatase and T bilirubin within normal range.  Troponin 47 from 36.  WBC 10 point 9K, hemoglobin 12.2, platelet count 188.  Undetermined rhythm Left anterior fascicular block.  Rate of 44.  Review of Systems: Review of systems as noted in the HPI. All other systems reviewed and are negative.   Past Medical History:  Diagnosis Date  . Breast cancer ([place]) 2012   right  . Hypertension   . Thyroid disease    Past Surgical History:  Procedure Laterality Date   . ABDOMINAL HYSTERECTOMY    . APPENDECTOMY    . MASTECTOMY Right 2012  . TONSILLECTOMY      Social History:  reports that she quit smoking about 55 years ago. Her smoking use included cigarettes. She has never used smokeless tobacco. She reports that she does not drink alcohol and does not use drugs.   Allergies  Allergen Reactions  . Other Other (See Comments)    "eye drop, unsure of name that caused  severe burning"  . Amoxicillin Rash    Family History  Problem Relation Age of Onset  . Breast cancer Sister       Prior to Admission medications   Medication Sig Start Date End Date Taking? Authorizing Provider  amLODipine-benazepril (LOTREL) 5-10 MG capsule Take 1 capsule by mouth daily.  04/01/15   [provider]  aspirin EC 81 MG tablet Take 81 mg by mouth.    [provider]  brimonidine (ALPHAGAN) 0.15 % ophthalmic solution Place 1 drop into the right eye 2 (two) times daily.  11/29/14   [provider]  calcium carbonate (TUMS - DOSED IN MG ELEMENTAL CALCIUM) 500 MG chewable tablet Chew 1,000 mg by mouth daily.     [provider]  Cholecalciferol (VITAMIN D3) 1000 UNITS CAPS Take 1 capsule by mouth daily.     [provider]  furosemide (LASIX) 20 MG tablet Take 20 mg by mouth daily as needed for fluid.     [provider]  levothyroxine (SYNTHROID, LEVOTHROID) 75 MCG tablet Take 75 mcg by mouth daily before breakfast.  09/10/14   [provider]  meloxicam (MOBIC) 7.5 MG tablet Take 7.5 mg by mouth daily. 12/26/18   [provider]  Multiple Vitamins-Minerals (PRESERVISION AREDS 2 PO) Take 1 tablet by mouth 2 (two) times daily.    [provider]  Omega-3 1000 MG CAPS Take 2,000 mg by mouth daily.     [provider]  prednisoLONE acetate (PRED FORTE) 1 % ophthalmic suspension Place 1 drop into the left eye daily.  09/10/14   [provider]    Physical Exam: BP (!) 112/94    Pulse 82   Temp 97.7 F (36.5 C) (Oral)   Resp 15   Ht 5\' 2"  (1.575 m)   Wt 45.4 kg   SpO2 93%   BMI 18.29 kg/m   . General: 87 y.o. year-old female well developed well nourished in no acute distress.  Alert and oriented x3. . Cardiovascular: Bradycardic with no rubs or gallops.  No thyromegaly or JVD noted.  No lower extremity edema. 2/4 pulses in all 4 extremities. Marland Kitchen Respiratory: Clear to auscultation with no wheezes or rales. Good inspiratory effort. . Abdomen: Soft nontender nondistended with normal bowel sounds x4 quadrants. . Muskuloskeletal: No cyanosis, clubbing or edema noted bilaterally . Neuro: CN II-XII intact, strength, sensation, reflexes . Skin: No ulcerative lesions noted or rashes . Psychiatry: Judgement and insight appear normal. Mood is appropriate for condition and setting          Labs on Admission:  Basic Metabolic Panel: Recent Labs  Lab 09/20/20 1138 09/20/20 1144  NA  --  127*  K  --  5.8*  CL  --  97*  CO2  --  22  GLUCOSE  --  171*  BUN  --  43*  CREATININE  --  1.50*  CALCIUM  --  9.2  MG 2.6*  --   PHOS  --  4.9*   Liver Function Tests: Recent Labs  Lab 09/20/20 1144  AST 199*  ALT 222*  ALKPHOS 94  BILITOT 0.9  PROT 6.6  ALBUMIN 4.1   No results for input(s): LIPASE, AMYLASE in the last 168 hours. No results for input(s): AMMONIA in the last 168 hours. CBC: Recent Labs  Lab 09/20/20 1144  WBC 10.9*  NEUTROABS 8.1*  HGB 12.2  HCT 37.6  MCV 91.5  PLT 188   Cardiac Enzymes: No results for input(s): CKTOTAL, CKMB, CKMBINDEX, TROPONINI in the last 168 hours.  BNP (last 3 results) No results for input(s): BNP in the last 8760 hours.  ProBNP (last 3 results) No results for input(s): PROBNP in the last 8760 hours.  CBG: No results for input(s): GLUCAP in the last 168 hours.  Radiological Exams on Admission: DG Chest Portable 1 View  Result Date: 09/20/2020 CLINICAL DATA:  Weakness, bradycardia, nausea, vomiting and  diarrhea. EXAM: PORTABLE CHEST 1 VIEW COMPARISON:  None. FINDINGS: Trachea is midline. Heart is enlarged. Thoracic aorta is calcified. Defibrillator screw pad overlies the upper left chest. Streaky densities in the lung bases. There may be a tiny left pleural effusion. No pneumothorax. High riding humeral heads bilaterally with degenerative changes. IMPRESSION: 1. Mild bibasilar atelectasis, right greater than left. 2. Possible tiny left pleural effusion. 3.  Aortic atherosclerosis (ICD10-I70.0). Electronically Signed   By: Lorin Picket M.D.   On: 09/20/2020 12:09    EKG: I independently viewed the EKG done and my findings are as followed:  Undetermined rhythm Left anterior fascicular block, rate of 44.  Assessment/Plan  Present on Admission: . Symptomatic bradycardia  Active Problems:   Symptomatic bradycardia  Symptomatic bradycardia with concern for advanced heart block Cardiology consulted by EDP, plan for pacemaker placement during this admission. Continue pacer at bedside. Management per cardiology.  Severe aortic stenosis, has declined aortic valve replacement in the past. Management per cardiology.  Elevated troponin likely demand ischemia in the setting of severe aortic stenosis Troponin 47 from 36.  She denies any chest pain at a time of this visit.  Acute transaminitis suspect related to her severe aortic stenosis with likely liver congestion Elevated LFTs Avoid hepatotoxic agents. Obtain abdominal ultrasound. Repeat levels in the morning.  Euvolemic hyponatremia Presented with serum sodium 127 Hold off on IV fluid due to severe aortic stenosis. Follow urine lytes Encourage oral intake. Repeat serum sodium in the morning.  AKI on CKD 3B Baseline creatinine appears to be 1.0 with GFR 47 Presented with creatinine of 1.5 with GFR of 32. She is on p.o. Lasix as needed Hold off Lasix for now. Monitor urine output. Avoid nephrotoxic agents. Repeat renal panel in  the morning.  Hyperkalemia in the setting of acute renal failure Presented with serum potassium 5.8 Treatment 1 dose of Lokelma. Repeat level in the morning  Hyperglycemia, no prior reported history of diabetes Obtain hemoglobin A1c Sensitive insulin sliding scale.  Acute urinary retention Reports she has not voided since yesterday 9 PM Obtain bladder scan In and out cath as needed  Mild leukocytosis WBC 10.9, afebrile. No evidence of pneumonia on chest x-ray Obtain UA   DVT prophylaxis: Subcu Lovenox daily.  Code Status: DNR stated by the patient herself, her nephew at bedside.  Family Communication: Nephew at bedside.  Disposition Plan: Admit to progressive cardiac unit.  Consults called: Cardiology.  Admission status: Inpatient status.  Patient will require at least 2 midnights for further evaluation and treatment of present condition.   Status is: Inpatient   Dispo:  Patient From: Home  Planned Disposition: Home with Health Care Svc  Anticipated discharge date 09/24/2020 or when cardiology signs off.  Medically stable for discharge: No, ongoing management of symptomatic bradycardia.         Kayleen Memos MD [facility] Pager [phone]  If 7PM-7AM, please contact night-coverage [email or web address] Password [facility]  09/20/2020, 3:43 PM

## 2020-09-20 NOTE — Consult Note (Signed)
[facility] Consultation Note  Patient ID: Carrie [name], MRN: [id number], DOB/AGE: [birth year] 87 y.o. Admit date: 09/20/2020   Date of Consult: 09/20/2020 Primary Physician: Marinda Elk, MD Primary Cardiologist: Nehemiah Massed  Chief Complaint:  Chief Complaint  Patient presents with  . Bradycardia   Reason for Consult: Bradycardia  HPI: 87 y.o. female with hypertension hyperlipidemia and severe aortic valve stenosis with a recent echocardiogram within the last 6 months showing normal LV systolic function with ejection fraction of 55% left ventricular hypertrophy and severe aortic valve stenosis with a peak velocity of 5.3 m/s.  The patient overall has been doing fairly well and has elected not to fix her aortic valve stenosis due to her physical ability and her concerns of surgical complications.  The patient is able to do most of the issues around her house with no trouble but recently felt like she was dehydrated and was seen in the office.  At that time she had an EKG showing a junctional rhythm of 40 bpm.  There was no evidence of P waves at this time and previous EKG has shown that she had sinus bradycardia at 52 bpm with a left anterior fascicular block.  The patient now presents with no evidence of chest pain syncope dizziness nausea or diaphoresis and no evidence of congestive heart failure or other primary side effects of the aortic valve stenosis.  Therefore after long discussion with the family as well as the patient we determined that she may be able to return to her previous physical activity if primary treatment of for symptomatic bradycardia were treated.  We have discussed at length the possibility of Micra pacemaker placement for this issue.  The family will continue to discussed but currently wish to proceed on with his treatment.  They understand all the risks and benefits of the treatment listed.  Past Medical History:  Diagnosis Date  . Breast cancer ([place])  2012   right  . Hypertension   . Thyroid disease       Surgical History:  Past Surgical History:  Procedure Laterality Date  . ABDOMINAL HYSTERECTOMY    . APPENDECTOMY    . MASTECTOMY Right 2012  . TONSILLECTOMY       Home Meds: Prior to Admission medications   Medication Sig Start Date End Date Taking? Authorizing Provider  amLODipine-benazepril (LOTREL) 5-10 MG capsule Take 1 capsule by mouth daily.  04/01/15   [provider]  aspirin EC 81 MG tablet Take 81 mg by mouth.    [provider]  calcium carbonate (TUMS - DOSED IN MG ELEMENTAL CALCIUM) 500 MG chewable tablet Chew 1,000 mg by mouth daily.     [provider]  Cholecalciferol (VITAMIN D3) 1000 UNITS CAPS Take 1 capsule by mouth daily.     [provider]  COMBIGAN 0.2-0.5 % ophthalmic solution Place 1 drop into the right eye 2 (two) times daily. 06/03/20   [provider]  furosemide (LASIX) 20 MG tablet Take 20 mg by mouth daily as needed for fluid.     [provider]  levothyroxine (SYNTHROID, LEVOTHROID) 75 MCG tablet Take 75 mcg by mouth daily before breakfast.  09/10/14   [provider]  meloxicam (MOBIC) 7.5 MG tablet Take 7.5 mg by mouth daily. 12/26/18   [provider]  Multiple Vitamins-Minerals (PRESERVISION AREDS 2 PO) Take 1 tablet by mouth 2 (two) times daily.    [provider]  Omega-3 1000 MG CAPS Take 2,000  mg by mouth daily.     [provider]    Inpatient Medications:  . brimonidine  1 drop Right Eye BID  . [START ON 09/21/2020] cholecalciferol  1,000 Units Oral Daily  . enoxaparin (LOVENOX) injection  30 mg Subcutaneous Q24H  . insulin aspart  0-6 Units Subcutaneous TID WC  . [START ON 09/21/2020] levothyroxine  75 mcg Oral QAC breakfast  . [START ON 09/21/2020] multivitamin-lutein  1 capsule Oral Daily  . omega-3 acid ethyl esters  1 g Oral BID  . prednisoLONE acetate  1 drop Left Eye Daily  . sodium zirconium  cyclosilicate  10 g Oral Once     Allergies:  Allergies  Allergen Reactions  . Other Other (See Comments)    "eye drop, unsure of name that caused  severe burning"  . Amoxicillin Rash    Social History   Socioeconomic History  . Marital status: Widowed    Spouse name: Not on file  . Number of children: Not on file  . Years of education: Not on file  . Highest education level: Not on file  Occupational History  . Not on file  Tobacco Use  . Smoking status: Former Smoker    Types: Cigarettes    Quit date: 1967    Years since quitting: 55.2  . Smokeless tobacco: Never Used  Substance and Sexual Activity  . Alcohol use: No  . Drug use: No  . Sexual activity: Not on file  Other Topics Concern  . Not on file  Social History Narrative  . Not on file   Social Determinants of Health   Financial Resource Strain: Not on file  Food Insecurity: Not on file  Transportation Needs: Not on file  Physical Activity: Not on file  Stress: Not on file  Social Connections: Not on file  Intimate Partner Violence: Not on file     Family History  Problem Relation Age of Onset  . Breast cancer Sister      Review of Systems Positive for weakness Negative for: General:  chills, fever, night sweats or weight changes.  Cardiovascular: PND orthopnea syncope dizziness  Dermatological skin lesions rashes Respiratory: Cough congestion Urologic: Frequent urination urination at night and hematuria Abdominal: negative for nausea, vomiting, diarrhea, bright red blood per rectum, melena, or hematemesis Neurologic: negative for visual changes, and/or hearing changes  All other systems reviewed and are otherwise negative except as noted above.  Labs: No results for input(s): CKTOTAL, CKMB, TROPONINI in the last 72 hours. Lab Results  Component Value Date   WBC 10.9 (H) 09/20/2020   HGB 12.2 09/20/2020   HCT 37.6 09/20/2020   MCV 91.5 09/20/2020   PLT 188 09/20/2020    Recent Labs   Lab 09/20/20 1144  NA 127*  K 5.8*  CL 97*  CO2 22  BUN 43*  CREATININE 1.50*  CALCIUM 9.2  PROT 6.6  BILITOT 0.9  ALKPHOS 94  ALT 222*  AST 199*  GLUCOSE 171*   No results found for: CHOL, HDL, LDLCALC, TRIG No results found for: DDIMER  Radiology/Studies:  DG Chest Portable 1 View  Result Date: 09/20/2020 CLINICAL DATA:  Weakness, bradycardia, nausea, vomiting and diarrhea. EXAM: PORTABLE CHEST 1 VIEW COMPARISON:  None. FINDINGS: Trachea is midline. Heart is enlarged. Thoracic aorta is calcified. Defibrillator screw pad overlies the upper left chest. Streaky densities in the lung bases. There may be a tiny left pleural effusion. No pneumothorax. High riding humeral heads bilaterally with degenerative changes. IMPRESSION:  1. Mild bibasilar atelectasis, right greater than left. 2. Possible tiny left pleural effusion. 3.  Aortic atherosclerosis (ICD10-I70.0). Electronically Signed   By: Lorin Picket M.D.   On: 09/20/2020 12:09    EKG: Junctional escape rhythm left axis deviation and 40 bpm  Weights: Filed Weights   09/20/20 1134  Weight: 45.4 kg     Physical Exam: Blood pressure (!) 115/54, pulse (!) 41, temperature 97.7 F (36.5 C), temperature source Oral, resp. rate 14, height 5\' 2"  (1.575 m), weight 45.4 kg, SpO2 100 %. Body mass index is 18.29 kg/m. General: Well developed, well nourished, in no acute distress. Head eyes ears nose throat: Normocephalic, atraumatic, sclera non-icteric, no xanthomas, nares are without discharge. No apparent thyromegaly and/or mass  Lungs: Normal respiratory effort.  no wheezes, no rales, no rhonchi.  Heart: RRR with normal S1 ABSENT S2.  4+ aortic murmur gallop, no rub, PMI is normal size and placement, carotid upstroke normal with  bruit, jugular venous pressure is normal Abdomen: Soft, non-tender, non-distended with normoactive bowel sounds. No hepatomegaly. No rebound/guarding. No obvious abdominal masses. Abdominal aorta is normal  size without bruit Extremities: Trace edema. no cyanosis, no clubbing, no ulcers  Peripheral : 2+ bilateral upper extremity pulses, 2+ bilateral femoral pulses, 2+ bilateral dorsal pedal pulse Neuro: Alert and oriented. No facial asymmetry. No focal deficit. Moves all extremities spontaneously. Musculoskeletal: Normal muscle tone without kyphosis Psych:  Responds to questions appropriately with a normal affect.    Assessment: 87 year old female with severe aortic valve stenosis with no current evidence of syncope dizziness nausea diaphoresis heart failure or chest pain having abnormal rhythm and symptomatic bradycardia with junctional escape  Plan: 1.  Avoid any beta-blocker medication management that could exacerbate rhythm disturbances and bradycardia 2.  Continue antihypertensive medications only if necessary but would avoid at this time on admission due to concerns of bradycardia 3.  No further cardiac diagnostics necessary at this time due to recently previous echocardiogram showing normal LV systolic function and no current evidence of acute coronary syndrome angina and/or congestive heart failure 4.  Proceed to [facility] pacemaker placement when able for symptomatic bradycardia.  Patient understands risk and benefits of Micra pacemaker.  This includes the possibility of death stroke heart attack infection bleeding blood clot in the femoral vein area and/or hemopericardium.  Patient is at low risk for conscious sedation  Signed, Corey Skains M.D. [facility] 09/20/2020, 4:31 PM

## 2020-09-20 NOTE — ED Notes (Signed)
Radiology at bedside

## 2020-09-20 NOTE — ED Triage Notes (Signed)
Pt comes into the ED via [facility] for bradycardia in the 30-40's.  Pt has had N/V/D that started this morning.  Pt has even and unlabored respirations at this time.  Pt denies any chest pain or SHOB.

## 2020-09-21 ENCOUNTER — Inpatient Hospital Stay: Payer: Medicare Other

## 2020-09-21 DIAGNOSIS — R001 Bradycardia, unspecified: Secondary | ICD-10-CM | POA: Diagnosis not present

## 2020-09-21 LAB — CBC WITH DIFFERENTIAL/PLATELET
Abs Immature Granulocytes: 0.05 10*3/uL (ref 0.00–0.07)
Basophils Absolute: 0 10*3/uL (ref 0.0–0.1)
Basophils Relative: 0 %
Eosinophils Absolute: 0 10*3/uL (ref 0.0–0.5)
Eosinophils Relative: 0 %
HCT: 33.4 % — ABNORMAL LOW (ref 36.0–46.0)
Hemoglobin: 11.6 g/dL — ABNORMAL LOW (ref 12.0–15.0)
Immature Granulocytes: 0 %
Lymphocytes Relative: 12 %
Lymphs Abs: 1.5 10*3/uL (ref 0.7–4.0)
MCH: 31.1 pg (ref 26.0–34.0)
MCHC: 34.7 g/dL (ref 30.0–36.0)
MCV: 89.5 fL (ref 80.0–100.0)
Monocytes Absolute: 1.6 10*3/uL — ABNORMAL HIGH (ref 0.1–1.0)
Monocytes Relative: 14 %
Neutro Abs: 8.8 10*3/uL — ABNORMAL HIGH (ref 1.7–7.7)
Neutrophils Relative %: 74 %
Platelets: 148 10*3/uL — ABNORMAL LOW (ref 150–400)
RBC: 3.73 MIL/uL — ABNORMAL LOW (ref 3.87–5.11)
RDW: 14.6 % (ref 11.5–15.5)
WBC: 11.9 10*3/uL — ABNORMAL HIGH (ref 4.0–10.5)
nRBC: 0 % (ref 0.0–0.2)

## 2020-09-21 LAB — COMPREHENSIVE METABOLIC PANEL
ALT: 166 U/L — ABNORMAL HIGH (ref 0–44)
AST: 113 U/L — ABNORMAL HIGH (ref 15–41)
Albumin: 3.7 g/dL (ref 3.5–5.0)
Alkaline Phosphatase: 84 U/L (ref 38–126)
Anion gap: 9 (ref 5–15)
BUN: 50 mg/dL — ABNORMAL HIGH (ref 8–23)
CO2: 20 mmol/L — ABNORMAL LOW (ref 22–32)
Calcium: 8.8 mg/dL — ABNORMAL LOW (ref 8.9–10.3)
Chloride: 98 mmol/L (ref 98–111)
Creatinine, Ser: 1.59 mg/dL — ABNORMAL HIGH (ref 0.44–1.00)
GFR, Estimated: 30 mL/min — ABNORMAL LOW (ref >60)
Glucose, Bld: 132 mg/dL — ABNORMAL HIGH (ref 70–99)
Potassium: 5 mmol/L (ref 3.5–5.1)
Sodium: 127 mmol/L — ABNORMAL LOW (ref 135–145)
Total Bilirubin: 1 mg/dL (ref 0.3–1.2)
Total Protein: 5.9 g/dL — ABNORMAL LOW (ref 6.5–8.1)

## 2020-09-21 LAB — GLUCOSE, CAPILLARY
Glucose-Capillary: 111 mg/dL — ABNORMAL HIGH (ref 70–99)
Glucose-Capillary: 129 mg/dL — ABNORMAL HIGH (ref 70–99)
Glucose-Capillary: 161 mg/dL — ABNORMAL HIGH (ref 70–99)
Glucose-Capillary: 129 mg/dL — ABNORMAL HIGH (ref 70–99)

## 2020-09-21 LAB — HEPATITIS PANEL, ACUTE
HCV Ab: NONREACTIVE
Hep A IgM: NONREACTIVE
Hep B C IgM: NONREACTIVE
Hepatitis B Surface Ag: NONREACTIVE

## 2020-09-21 MED ORDER — AMLODIPINE BESYLATE 10 MG PO TABS
10.0000 mg | ORAL_TABLET | Freq: Every day | ORAL | Status: DC
  Administered 2020-09-21 – 2020-09-22 (×2): 10 mg via ORAL
  Filled 2020-09-21 (×2): qty 1

## 2020-09-21 MED ORDER — AMLODIPINE BESYLATE 5 MG PO TABS: 5.0000 mg | ORAL_TABLET | Freq: Every day | ORAL | Status: DC

## 2020-09-21 MED ORDER — LACTATED RINGERS IV SOLN: INTRAVENOUS | Status: AC

## 2020-09-21 NOTE — Progress Notes (Signed)
[facility] Encounter Note  Patient: Carrie [name] / Admit Date: 09/20/2020 / Date of Encounter: 09/21/2020, 6:52 AM   Subjective: 3/19.  Patient has done very well overnight with no evidence of significant syncope dizziness nausea diaphoresis or other cardiac symptoms.  Telemetry has shown sinus rhythm at 60 bpm.  Previous telemetry yesterday has shown symptomatic bradycardia with junctional escape at 30 beats to 40 bpm.  Patient has had better hydration and presented with dehydration as her more main complaint.  Currently that is improving at this time.  Troponin elevation most consistent with demand ischemia rather than acute coronary syndrome  Review of Systems: Positive for: Weakness Negative for: Vision change, hearing change, syncope, dizziness, nausea, vomiting,diarrhea, bloody stool, stomach pain, cough, congestion, diaphoresis, urinary frequency, urinary pain,skin lesions, skin rashes Others previously listed  Objective: Telemetry: Normal sinus rhythm Physical Exam: Blood pressure (!) 149/59, pulse (!) 58, temperature 98 F (36.7 C), temperature source Oral, resp. rate 16, height 5\' 2"  (1.575 m), weight 48.2 kg, SpO2 93 %. Body mass index is 19.44 kg/m. General: Well developed, well nourished, in no acute distress. Head: Normocephalic, atraumatic, sclera non-icteric, no xanthomas, nares are without discharge. Neck: No apparent masses Lungs: Normal respirations with no wheezes, no rhonchi, no rales , no crackles   Heart: Regular rate and rhythm, normal S1 ABSENT S2, 4+ aortic murmur, no rub, no gallop, PMI is normal size and placement, carotid upstroke normal with bruit, jugular venous pressure normal Abdomen: Soft, non-tender, non-distended with normoactive bowel sounds. No hepatosplenomegaly. Abdominal aorta is normal size without bruit Extremities: No edema, no clubbing, no cyanosis, no ulcers,  Peripheral: 2+ radial, 2+ femoral, 2+ dorsal pedal  pulses Neuro: Alert and oriented. Moves all extremities spontaneously. Psych:  Responds to questions appropriately with a normal affect.   Intake/Output Summary (Last 24 hours) at 09/21/2020 1660 Last data filed at 09/21/2020 0000 Gross per 24 hour  Intake 740 ml  Output --  Net 740 ml    Inpatient Medications:  . brimonidine  1 drop Right Eye BID  . cholecalciferol  1,000 Units Oral Daily  . enoxaparin (LOVENOX) injection  30 mg Subcutaneous Q24H  . insulin aspart  0-6 Units Subcutaneous TID WC  . levothyroxine  75 mcg Oral QAC breakfast  . multivitamin-lutein  1 capsule Oral Daily  . omega-3 acid ethyl esters  1 g Oral BID  . prednisoLONE acetate  1 drop Left Eye Daily   Infusions:   Labs: Recent Labs    09/20/20 1138 09/20/20 1144 09/21/20 0403  NA  --  127* 127*  K  --  5.8* 5.0  CL  --  97* 98  CO2  --  22 20*  GLUCOSE  --  171* 132*  BUN  --  43* 50*  CREATININE  --  1.50* 1.59*  CALCIUM  --  9.2 8.8*  MG 2.6*  --   --   PHOS  --  4.9*  --    Recent Labs    09/20/20 1144 09/21/20 0403  AST 199* 113*  ALT 222* 166*  ALKPHOS 94 84  BILITOT 0.9 1.0  PROT 6.6 5.9*  ALBUMIN 4.1 3.7   Recent Labs    09/20/20 1144 09/21/20 0403  WBC 10.9* 11.9*  NEUTROABS 8.1* 8.8*  HGB 12.2 11.6*  HCT 37.6 33.4*  MCV 91.5 89.5  PLT 188 148*   No results for input(s): CKTOTAL, CKMB, TROPONINI in the last 72 hours. Invalid input(s): POCBNP Recent  Labs    09/20/20 1144  HGBA1C 5.4     Weights: Filed Weights   09/20/20 1134 09/21/20 0428  Weight: 45.4 kg 48.2 kg     Radiology/Studies:  DG Chest Portable 1 View  Result Date: 09/20/2020 CLINICAL DATA:  Weakness, bradycardia, nausea, vomiting and diarrhea. EXAM: PORTABLE CHEST 1 VIEW COMPARISON:  None. FINDINGS: Trachea is midline. Heart is enlarged. Thoracic aorta is calcified. Defibrillator screw pad overlies the upper left chest. Streaky densities in the lung bases. There may be a tiny left pleural effusion.  No pneumothorax. High riding humeral heads bilaterally with degenerative changes. IMPRESSION: 1. Mild bibasilar atelectasis, right greater than left. 2. Possible tiny left pleural effusion. 3.  Aortic atherosclerosis (ICD10-I70.0). Electronically Signed   By: Lorin Picket M.D.   On: 09/20/2020 12:09     Assessment and Recommendation  87 y.o. female with known severe aortic valve stenosis and hypertension having likely acute dehydration and acute kidney injury now slowly improving with new onset symptomatic bradycardia with junctional escape now resolved. 1.  Begin ambulation and follow-up for improvements of symptoms and causes of acute dehydration 2.  Further consideration of Micra pacemaker placement for episode of symptomatic bradycardia although if fully resolved with ambulation and addition of hydration and treatment may be able to defer to outpatient 3.  No use of furosemide at this time due to concerns of dehydration and acute kidney injury 4.  Abstain from antihypertensives at this time due to concerns of above 5.  With ambulation today we will make further decisions on above  Signed, Serafina Royals M.D. FACC

## 2020-09-21 NOTE — Progress Notes (Signed)
Mobility Specialist - Progress Note   09/21/20 1100  Mobility  Activity Ambulated in hall  Level of Assistance Minimal assist, patient does 75% or more  Assistive Device Front wheel walker  Distance Ambulated (ft) 500 ft  Mobility Response Tolerated well  Mobility performed by Mobility specialist  $Mobility charge 1 Mobility    Pre-mobility: 62 HR, 100% SpO2 During mobility: 77 HR, 91% SpO2 Post-mobility: 69 HR, 97%  SpO2   Pt ambulated in hallway with RW. Increased speed. Son present for session. Pt ambulated 1st 200' on RA, O2 desat to a low of 91%. Pt voiced mild SOB and weakness. Pt continued remainder of ambulation on 3L with O2 sats maintaining high 90s. Max HR of 81 bpm. MinA during ambulation for steering as pt would occasionally bump into stationary objects. Upon return to room, confirmed with RN to discontinue O2 use. Monitored O2 for 2 mins, 97% RA at exit.   Pt motivated for another mobility session later this date as she states she walks about 4x/day. Will attempt if time permits.  Kathee Delton Mobility Specialist 09/21/20, 11:38 AM

## 2020-09-21 NOTE — Progress Notes (Addendum)
PROGRESS NOTE    Carrie [name]  YIA:[id number] DOB: 05-Oct-1922 DOA: 09/20/2020 PCP: Marinda Elk, MD  [phone]   Assessment & Plan:   Active Problems:   Symptomatic bradycardia   Carrie [name] is a 87 y.o. female with medical history significant for severe aortic stenosis, has declined aortic valve replacement in the past, essential hypertension, hypothyroidism, who presented to [facility] ED at her PCPs recommendation due to symptomatic bradycardia.  Patient initially presented to her PCPs office due to sudden onset of diarrhea last night associated with nausea and vomiting.    At PCPs office, there were 10 vital signs revealed heart rate in the 30s and 40s.  She felt dizzy therefore was sent to the ED for further evaluation.  Cardiology Dr. Tamala Ser was contacted who reviewed EKG and recommended admission for pacemaker placement.  She denies having any syncopal episodes.  She admits to intermittent chest discomfort chest tightness for the past 2 to 3 months.     Symptomatic bradycardia with concern for advanced heart block --Dr. Nehemiah Massed saw pt, decision for [facility] pacemaker pending for either inpatient or outpatient.  Severe aortic stenosis, has declined aortic valve replacement in the past. Management per cardiology.  Elevated troponin likely demand ischemia in the setting of severe aortic stenosis Troponin 47 from 36.  She denies any chest pain at a time of this visit.  Acute transaminitis suspect related to her severe aortic stenosis with likely liver congestion Elevated LFTs. Abdominal US showed Cholelithiasis without strong evidence of acute cholecystitis.  Euvolemic hyponatremia Presented with serum sodium 127 Hold off on IV fluid due to severe aortic stenosis. Follow urine lytes Encourage oral intake.  AKI on CKD 3B Baseline creatinine appears to be 1.0 with GFR 47 Presented with creatinine of 1.5 with GFR of 32. She is on p.o. Lasix as needed at  home. Plan: --Hold home Lasix and benazepril --LR@50  for 20 hours  Hyperkalemia in the setting of acute renal failure Presented with serum potassium 5.8 Treatment 1 dose of Lokelma.  Hyperglycemia, no prior reported history of diabetes --A1c 5.4.   --SSI   Acute urinary retention, ruled out  Mild leukocytosis WBC 10.9, afebrile. No evidence of pneumonia on chest x-ray  HTN --BP meds held on presentation, BP trending up to 170's this morning. --resume home amlodipine at increased 10 mg daily --Hold home lasix and benazepril due to AKI   DVT prophylaxis: Lovenox SQ Code Status: DNR  Family Communication: nephew updated at bedside Level of care: Progressive Cardiac Dispo:   The patient is from: home Anticipated d/c is to: home Anticipated d/c date is: 2-3 days Patient currently is not medically ready to d/c due to: pending eval for pacemaker   Subjective and Interval History:  No chest pain.  Pt was up walking with mobility tech with HR in 70's.     Objective: Vitals:   09/21/20 0428 09/21/20 0844 09/21/20 1230 09/21/20 1700  BP: (!) 149/59 (!) 175/58 (!) 140/55 (!) 150/60  Pulse: (!) 58 65 68 70  Resp: 16 16 16 16   Temp: 98 F (36.7 C) 97.9 F (36.6 C) 98 F (36.7 C) 97.9 F (36.6 C)  TempSrc: Oral     SpO2: 93% 98% 97% 95%  Weight: 48.2 kg     Height:        Intake/Output Summary (Last 24 hours) at 09/21/2020 1719 Last data filed at 09/21/2020 1700 Gross per 24 hour  Intake 501.81 ml  Output 753 ml  Net -251.19 ml   Filed Weights   09/20/20 1134 09/21/20 0428  Weight: 45.4 kg 48.2 kg    Examination:   Constitutional: NAD, AAOx3 HEENT: conjunctivae abnormal on the left, legally blind CV: RRR, 3+ harsh systolic murmur at left sternal boarder, No cyanosis.   RESP: normal respiratory effort, on RA Extremities: No effusions, edema in BLE SKIN: warm, dry Neuro: II - XII grossly intact.   Psych: Normal mood and affect.  Appropriate judgement and  reason   Data Reviewed: I have personally reviewed following labs and imaging studies  CBC: Recent Labs  Lab 09/20/20 1144 09/21/20 0403  WBC 10.9* 11.9*  NEUTROABS 8.1* 8.8*  HGB 12.2 11.6*  HCT 37.6 33.4*  MCV 91.5 89.5  PLT 188 503*   Basic Metabolic Panel: Recent Labs  Lab 09/20/20 1138 09/20/20 1144 09/21/20 0403  NA  --  127* 127*  K  --  5.8* 5.0  CL  --  97* 98  CO2  --  22 20*  GLUCOSE  --  171* 132*  BUN  --  43* 50*  CREATININE  --  1.50* 1.59*  CALCIUM  --  9.2 8.8*  MG 2.6*  --   --   PHOS  --  4.9*  --    GFR: Estimated Creatinine Clearance: 16.1 mL/min (A) (by C-G formula based on SCr of 1.59 mg/dL (H)). Liver Function Tests: Recent Labs  Lab 09/20/20 1144 09/21/20 0403  AST 199* 113*  ALT 222* 166*  ALKPHOS 94 84  BILITOT 0.9 1.0  PROT 6.6 5.9*  ALBUMIN 4.1 3.7   No results for input(s): LIPASE, AMYLASE in the last 168 hours. No results for input(s): AMMONIA in the last 168 hours. Coagulation Profile: No results for input(s): INR, PROTIME in the last 168 hours. Cardiac Enzymes: No results for input(s): CKTOTAL, CKMB, CKMBINDEX, TROPONINI in the last 168 hours. BNP (last 3 results) No results for input(s): PROBNP in the last 8760 hours. HbA1C: Recent Labs    09/20/20 1144  HGBA1C 5.4   CBG: Recent Labs  Lab 09/20/20 1722 09/20/20 2058 09/21/20 0846 09/21/20 1233 09/21/20 1708  GLUCAP 114* 163* 111* 129* 161*   Lipid Profile: No results for input(s): CHOL, HDL, LDLCALC, TRIG, CHOLHDL, LDLDIRECT in the last 72 hours. Thyroid Function Tests: Recent Labs    09/20/20 1138  TSH 5.869*  FREET4 1.19*   Anemia Panel: No results for input(s): VITAMINB12, FOLATE, FERRITIN, TIBC, IRON, RETICCTPCT in the last 72 hours. Sepsis Labs: No results for input(s): PROCALCITON, LATICACIDVEN in the last 168 hours.  Recent Results (from the past 240 hour(s))  Resp Panel by RT-PCR (Flu A&B, Covid) Nasopharyngeal Swab     Status: None    Collection Time: 09/20/20 12:00 PM   Specimen: Nasopharyngeal Swab; Nasopharyngeal(NP) swabs in vial transport medium  Result Value Ref Range Status   SARS Coronavirus 2 by RT PCR NEGATIVE NEGATIVE Final    Comment: (NOTE) SARS-CoV-2 target nucleic acids are NOT DETECTED.  The SARS-CoV-2 RNA is generally detectable in upper respiratory specimens during the acute phase of infection. The lowest concentration of SARS-CoV-2 viral copies this assay can detect is 138 copies/mL. A negative result does not preclude SARS-Cov-2 infection and should not be used as the sole basis for treatment or other patient management decisions. A negative result may occur with  improper specimen collection/handling, submission of specimen other than nasopharyngeal swab, presence of viral mutation(s) within the areas targeted by this assay, and inadequate number of  viral copies(<138 copies/mL). A negative result must be combined with clinical observations, patient history, and epidemiological information. The expected result is Negative.  Fact Sheet for Patients:  EntrepreneurPulse.com.au  Fact Sheet for Healthcare Providers:  IncredibleEmployment.be  This test is no t yet approved or cleared by the [place] FDA and  has been authorized for detection and/or diagnosis of SARS-CoV-2 by FDA under an Emergency Use Authorization (EUA). This EUA will remain  in effect (meaning this test can be used) for the duration of the COVID-19 declaration under Section 564(b)(1) of the Act, 21 U.S.C.section 360bbb-3(b)(1), unless the authorization is terminated  or revoked sooner.       Influenza A by PCR NEGATIVE NEGATIVE Final   Influenza B by PCR NEGATIVE NEGATIVE Final    Comment: (NOTE) The Xpert Xpress SARS-CoV-2/FLU/RSV plus assay is intended as an aid in the diagnosis of influenza from Nasopharyngeal swab specimens and should not be used as a sole basis for treatment.  Nasal washings and aspirates are unacceptable for Xpert Xpress SARS-CoV-2/FLU/RSV testing.  Fact Sheet for Patients: EntrepreneurPulse.com.au  Fact Sheet for Healthcare Providers: IncredibleEmployment.be  This test is not yet approved or cleared by the [place] FDA and has been authorized for detection and/or diagnosis of SARS-CoV-2 by FDA under an Emergency Use Authorization (EUA). This EUA will remain in effect (meaning this test can be used) for the duration of the COVID-19 declaration under Section 564(b)(1) of the Act, 21 U.S.C. section 360bbb-3(b)(1), unless the authorization is terminated or revoked.  Performed at [facility], [address] [place], [place] [address]       Radiology Studies: US Abdomen Complete  Result Date: 09/21/2020 CLINICAL DATA:  87 year old female with transaminitis. EXAM: ABDOMEN ULTRASOUND COMPLETE COMPARISON:  CT Abdomen and Pelvis 05/07/2013. FINDINGS: Gallbladder: Gallstones, individually estimated at 9 mm (image 3). Partially contracted gallbladder, wall thickness 3-4 mm. No pericholecystic fluid. No sonographic Murphy sign elicited. Common bile duct: Diameter: 3 mm, normal. Liver: Background liver echogenicity is within normal limits. There is a questionable oval or triangular mixed echogenicity lesion in the subcapsular right lobe (image 40) without hypervascularity. This measures up to 1.9 cm might be artifact from the falciform ligament, uncertain. Portal vein is patent on color Doppler imaging with normal direction of blood flow towards the liver. IVC: No abnormality visualized. Pancreas: Visualized portion unremarkable. Spleen: Size and appearance within normal limits. Right Kidney: Length: 8.7 cm. Echogenicity within normal limits. No mass or hydronephrosis visualized. Left Kidney: Length: 8.9 cm. No left hydronephrosis. Round partially echogenic 2 cm area at the left renal midpole (image 64) is  more suggestive of benign angiomyolipoma than renal cell carcinoma (image 68). No left hydronephrosis. No other discrete left renal mass. Abdominal aorta: No aneurysm visualized. Other findings: No free fluid. IMPRESSION: 1. Cholelithiasis without strong evidence of acute cholecystitis at this time. 2. Indeterminate but probable benign roughly 2 cm lesions of the right hepatic lobe and the left kidney. Both are partially echogenic, which favors benign etiology such as hemangioma and angiomyolipoma (respectively). 3. Otherwise negative for age ultrasound appearance of the abdomen. Electronically Signed   By: Genevie Ann M.D.   On: 09/21/2020 11:54   DG Chest Portable 1 View  Result Date: 09/20/2020 CLINICAL DATA:  Weakness, bradycardia, nausea, vomiting and diarrhea. EXAM: PORTABLE CHEST 1 VIEW COMPARISON:  None. FINDINGS: Trachea is midline. Heart is enlarged. Thoracic aorta is calcified. Defibrillator screw pad overlies the upper left chest. Streaky densities in the lung bases. There  may be a tiny left pleural effusion. No pneumothorax. High riding humeral heads bilaterally with degenerative changes. IMPRESSION: 1. Mild bibasilar atelectasis, right greater than left. 2. Possible tiny left pleural effusion. 3.  Aortic atherosclerosis (ICD10-I70.0). Electronically Signed   By: Lorin Picket M.D.   On: 09/20/2020 12:09     Scheduled Meds:  amLODipine  10 mg Oral Daily   brimonidine  1 drop Right Eye BID   cholecalciferol  1,000 Units Oral Daily   enoxaparin (LOVENOX) injection  30 mg Subcutaneous Q24H   insulin aspart  0-6 Units Subcutaneous TID WC   levothyroxine  75 mcg Oral QAC breakfast   multivitamin-lutein  1 capsule Oral Daily   omega-3 acid ethyl esters  1 g Oral BID   prednisoLONE acetate  1 drop Left Eye Daily   Continuous Infusions:  lactated ringers 50 mL/hr at 09/21/20 1004     LOS: 1 day     Enzo Bi, MD [facility] If 7PM-7AM, please contact  night-coverage 09/21/2020, 5:19 PM

## 2020-09-22 ENCOUNTER — Encounter: Payer: Self-pay | Admitting: Internal Medicine

## 2020-09-22 ENCOUNTER — Inpatient Hospital Stay: Payer: Medicare Other

## 2020-09-22 DIAGNOSIS — R001 Bradycardia, unspecified: Secondary | ICD-10-CM | POA: Diagnosis not present

## 2020-09-22 DIAGNOSIS — J9601 Acute respiratory failure with hypoxia: Secondary | ICD-10-CM

## 2020-09-22 LAB — CBC
HCT: 36.8 % (ref 36.0–46.0)
Hemoglobin: 12.7 g/dL (ref 12.0–15.0)
MCH: 30.2 pg (ref 26.0–34.0)
MCHC: 34.5 g/dL (ref 30.0–36.0)
MCV: 87.6 fL (ref 80.0–100.0)
Platelets: 166 10*3/uL (ref 150–400)
RBC: 4.2 MIL/uL (ref 3.87–5.11)
RDW: 14.2 % (ref 11.5–15.5)
WBC: 18.3 10*3/uL — ABNORMAL HIGH (ref 4.0–10.5)
nRBC: 0 % (ref 0.0–0.2)

## 2020-09-22 LAB — BASIC METABOLIC PANEL
Anion gap: 9 (ref 5–15)
BUN: 37 mg/dL — ABNORMAL HIGH (ref 8–23)
CO2: 22 mmol/L (ref 22–32)
Calcium: 8.6 mg/dL — ABNORMAL LOW (ref 8.9–10.3)
Chloride: 96 mmol/L — ABNORMAL LOW (ref 98–111)
Creatinine, Ser: 1.03 mg/dL — ABNORMAL HIGH (ref 0.44–1.00)
GFR, Estimated: 50 mL/min — ABNORMAL LOW (ref >60)
Glucose, Bld: 156 mg/dL — ABNORMAL HIGH (ref 70–99)
Potassium: 4.7 mmol/L (ref 3.5–5.1)
Sodium: 127 mmol/L — ABNORMAL LOW (ref 135–145)

## 2020-09-22 LAB — MAGNESIUM: Magnesium: 2 mg/dL (ref 1.7–2.4)

## 2020-09-22 LAB — GLUCOSE, CAPILLARY
Glucose-Capillary: 135 mg/dL — ABNORMAL HIGH (ref 70–99)
Glucose-Capillary: 153 mg/dL — ABNORMAL HIGH (ref 70–99)
Glucose-Capillary: 113 mg/dL — ABNORMAL HIGH (ref 70–99)
Glucose-Capillary: 114 mg/dL — ABNORMAL HIGH (ref 70–99)

## 2020-09-22 LAB — PROCALCITONIN: Procalcitonin: 0.13 ng/mL

## 2020-09-22 MED ORDER — FUROSEMIDE 40 MG PO TABS
40.0000 mg | ORAL_TABLET | Freq: Once | ORAL | Status: AC
  Administered 2020-09-22: 40 mg via ORAL
  Filled 2020-09-22: qty 1

## 2020-09-22 MED ORDER — FUROSEMIDE 10 MG/ML IJ SOLN
20.0000 mg | Freq: Once | INTRAMUSCULAR | Status: AC
  Administered 2020-09-22: 20 mg via INTRAVENOUS
  Filled 2020-09-22: qty 2

## 2020-09-22 MED ORDER — MORPHINE SULFATE (PF) 2 MG/ML IV SOLN
1.0000 mg | Freq: Once | INTRAVENOUS | Status: AC
Start: 2020-09-22 — End: 2020-09-22
  Administered 2020-09-22: 1 mg via INTRAVENOUS
  Filled 2020-09-22: qty 1

## 2020-09-22 MED ORDER — IPRATROPIUM-ALBUTEROL 0.5-2.5 (3) MG/3ML IN SOLN: 3.0000 mL | Freq: Four times a day (QID) | RESPIRATORY_TRACT | Status: DC | PRN

## 2020-09-22 NOTE — Progress Notes (Signed)
   09/22/20 0130  Assess: MEWS Score  BP (!) 184/86  Pulse Rate (!) 105  Resp (!) 24  Level of Consciousness Alert  SpO2 90 %  O2 Device Nasal Cannula  O2 Flow Rate (L/min) 5 L/min  Assess: MEWS Score  MEWS Temp 0  MEWS Systolic 0  MEWS Pulse 1  MEWS RR 1  MEWS LOC 0  MEWS Score 2  MEWS Score Color Yellow  Assess: if the MEWS score is Yellow or Red  Were vital signs taken at a resting state? No  Focused Assessment Change from prior assessment (see assessment flowsheet)  Early Detection of Sepsis Score *See Row Information* Low  MEWS guidelines implemented *See Row Information* Yes  Escalate  MEWS: Escalate Yellow: discuss with charge nurse/RN and consider discussing with provider and RRT  Notify: Charge Nurse/RN  Name of Charge Nurse/RN Notified Jake Samples  Date Charge Nurse/RN Notified 09/22/20  Time Charge Nurse/RN Notified 0130  Notify: Provider  Provider Name/Title Rufina Falco, NP  Date Provider Notified 09/22/20  Time Provider Notified [phone]  Notification Type Page  Notification Reason Change in status  Provider response En route  Date of Provider Response 09/22/20  Time of Provider Response 0140

## 2020-09-22 NOTE — Progress Notes (Signed)
    BRIEF OVERNIGHT PROGRESS REPORT  SUBJECTIVE: Per nursing staff " Patient woke up having difficulty breathing, oxygen needs increased from 2.5L to 5L sat on 5L 90%. Audible heavy crackles with tripod breathing. IV fluids that were running at 78mLs were stopped"  OBJECTIVE: On bedside assessment, she was afebrile with blood pressure 184/86 mm Hg and pulse rate 105 beats/min. There were no focal neurological deficits; she was alert and oriented x4, noted with increased work of breathing and tripoding. Lungs with moderate crackles bilaterally.  ASSESSMENT & PLAN: -Acute Hypoxic Respiratory Failure secondary to possible pulmonary vascular congestion in a patient with severe aortic stenosis -Supplemental O2 as needed to maintain O2 saturations 88 to 92% -Follow intermittent ABG and chest x-ray as needed -CXR on with bilateral pleural effusions, and findings of pulmonary edema; no new focal infiltrate or hyperinflation noted; currently with moderate crackles upon auscultation -IV Lasix 20 mg x 1, previously held due to AKI -As needed bronchodilators        Rufina Falco, DNP, CCRN, FNP-C, AGACNP-BC [facility]ition[facility]  [facility]

## 2020-09-22 NOTE — Progress Notes (Signed)
PROGRESS NOTE    Carrie [name]  UXL:[id number] DOB: 10-Jul-1922 DOA: 09/20/2020 PCP: Marinda Elk, MD  [phone]   Assessment & Plan:   Active Problems:   Symptomatic bradycardia   Carrie [name] is a 87 y.o. female with medical history significant for severe aortic stenosis, has declined aortic valve replacement in the past, essential hypertension, hypothyroidism, who presented to [facility] ED at her PCPs recommendation due to symptomatic bradycardia.  Patient initially presented to her PCPs office due to sudden onset of diarrhea last night associated with nausea and vomiting.    At PCPs office, there were 10 vital signs revealed heart rate in the 30s and 40s.  She felt dizzy therefore was sent to the ED for further evaluation.  Cardiology Dr. Tamala Ser was contacted who reviewed EKG and recommended admission for pacemaker placement.  She denies having any syncopal episodes.  She admits to intermittent chest discomfort chest tightness for the past 2 to 3 months.     Symptomatic bradycardia with concern for advanced heart block --Dr. Nehemiah Massed saw pt, decision for [facility] pacemaker is still pending  --hold all beta blocker  Severe aortic stenosis, has declined aortic valve replacement in the past. Management per cardiology. --avoid IVF.  Acute hypoxic respiratory failure 2/2 pulm edema from fluid overload --developed early morning 3/20, caused by gentle IVF hydration. --s/p IV lasix 20 mg x1 and oral lasix 40 mg x1 Plan: --Hold all IVF --Continue supplemental O2 to keep sats >=92%, wean as tolerated  Elevated troponin likely demand ischemia in the setting of severe aortic stenosis Troponin 47 from 36.  She denies any chest pain at a time of this visit.  Acute transaminitis suspect related to her severe aortic stenosis with likely liver congestion Elevated LFTs. Abdominal US showed Cholelithiasis without strong evidence of acute cholecystitis.  Euvolemic  hyponatremia Presented with serum sodium 127, which didn't improve with MIVF.  AKI, POA CKD 3B Baseline creatinine appears to be 1.0 with GFR 47 Presented with creatinine of 1.5 with GFR of 32. She is on p.o. Lasix as needed at home. --Cr improved with MIVF Plan: --Hold home Lasix and benazepril --hold further MIVF  Hyperkalemia in the setting of acute renal failure, resolved Presented with serum potassium 5.8 Treatment 1 dose of Lokelma.  Hyperglycemia, no prior reported history of diabetes --A1c 5.4.   --SSI   Acute urinary retention, ruled out  Leukocytosis, worsened WBC 10.9, afebrile.  Increased to 18.3 this morning. --CXR showed "patchy opacities are favored to reflect developing alveolar edema though infection is difficult to fully exclude." --procal obtained this morning, 0.13. --hold abx for now as no source of infection and procal low.  HTN --BP meds held on presentation, BP trending up to 170's this morning. --cont home amlodipine at increased 10 mg daily --Hold home lasix and benazepril due to AKI   DVT prophylaxis: Lovenox SQ Code Status: DNR  Family Communication: nephew updated at bedside Level of care: Progressive Cardiac Dispo:   The patient is from: home Anticipated d/c is to: home Anticipated d/c date is: 1-2 days Patient currently is not medically ready to d/c due to: pending eval for pacemaker, and weaning down new O2 requirement   Subjective and Interval History:  Overnight, pt had acute hypoxia from fluid overload.  Improved with diuresis.    Dyspnea improved this morning.  HR also better.  Pt had not complaints.     Objective: Vitals:   09/22/20 0303 09/22/20 0530 09/22/20 0753 09/22/20 1201  BP: (!) 153/61 130/61 (!) 161/80 (!) 157/70  Pulse: 75 67 79 87  Resp: 20 18 16 16   Temp: 97.7 F (36.5 C) (!) 97.5 F (36.4 C) 97.9 F (36.6 C) 98.6 F (37 C)  TempSrc: Oral Oral Oral Oral  SpO2: 98% 99% 94% 98%  Weight:      Height:         Intake/Output Summary (Last 24 hours) at 09/22/2020 1456 Last data filed at 09/22/2020 0900 Gross per 24 hour  Intake 901.95 ml  Output 801 ml  Net 100.95 ml   Filed Weights   09/20/20 1134 09/21/20 0428  Weight: 45.4 kg 48.2 kg    Examination:   Constitutional: NAD, AAOx3 HEENT: conjunctivae and lids normal, EOMI CV: RRR, 3+ harsh systolic murmur at sternal boarder, No cyanosis.   RESP: mild crackles at right posterior base, on 2L Extremities: No effusions, edema in BLE SKIN: warm, dry Neuro: II - XII grossly intact.   Psych: Normal mood and affect.  Appropriate judgement and reason   Data Reviewed: I have personally reviewed following labs and imaging studies  CBC: Recent Labs  Lab 09/20/20 1144 09/21/20 0403 09/22/20 0408  WBC 10.9* 11.9* 18.3*  NEUTROABS 8.1* 8.8*  --   HGB 12.2 11.6* 12.7  HCT 37.6 33.4* 36.8  MCV 91.5 89.5 87.6  PLT 188 148* 354   Basic Metabolic Panel: Recent Labs  Lab 09/20/20 1138 09/20/20 1144 09/21/20 0403 09/22/20 0408  NA  --  127* 127* 127*  K  --  5.8* 5.0 4.7  CL  --  97* 98 96*  CO2  --  22 20* 22  GLUCOSE  --  171* 132* 156*  BUN  --  43* 50* 37*  CREATININE  --  1.50* 1.59* 1.03*  CALCIUM  --  9.2 8.8* 8.6*  MG 2.6*  --   --  2.0  PHOS  --  4.9*  --   --    GFR: Estimated Creatinine Clearance: 24.9 mL/min (A) (by C-G formula based on SCr of 1.03 mg/dL (H)). Liver Function Tests: Recent Labs  Lab 09/20/20 1144 09/21/20 0403  AST 199* 113*  ALT 222* 166*  ALKPHOS 94 84  BILITOT 0.9 1.0  PROT 6.6 5.9*  ALBUMIN 4.1 3.7   No results for input(s): LIPASE, AMYLASE in the last 168 hours. No results for input(s): AMMONIA in the last 168 hours. Coagulation Profile: No results for input(s): INR, PROTIME in the last 168 hours. Cardiac Enzymes: No results for input(s): CKTOTAL, CKMB, CKMBINDEX, TROPONINI in the last 168 hours. BNP (last 3 results) No results for input(s): PROBNP in the last 8760  hours. HbA1C: Recent Labs    09/20/20 1144  HGBA1C 5.4   CBG: Recent Labs  Lab 09/21/20 1233 09/21/20 1708 09/21/20 2043 09/22/20 0854 09/22/20 1157  GLUCAP 129* 161* 129* 135* 153*   Lipid Profile: No results for input(s): CHOL, HDL, LDLCALC, TRIG, CHOLHDL, LDLDIRECT in the last 72 hours. Thyroid Function Tests: Recent Labs    09/20/20 1138  TSH 5.869*  FREET4 1.19*   Anemia Panel: No results for input(s): VITAMINB12, FOLATE, FERRITIN, TIBC, IRON, RETICCTPCT in the last 72 hours. Sepsis Labs: Recent Labs  Lab 09/22/20 0408  PROCALCITON 0.13    Recent Results (from the past 240 hour(s))  Resp Panel by RT-PCR (Flu A&B, Covid) Nasopharyngeal Swab     Status: None   Collection Time: 09/20/20 12:00 PM   Specimen: Nasopharyngeal Swab; Nasopharyngeal(NP) swabs in vial transport  medium  Result Value Ref Range Status   SARS Coronavirus 2 by RT PCR NEGATIVE NEGATIVE Final    Comment: (NOTE) SARS-CoV-2 target nucleic acids are NOT DETECTED.  The SARS-CoV-2 RNA is generally detectable in upper respiratory specimens during the acute phase of infection. The lowest concentration of SARS-CoV-2 viral copies this assay can detect is 138 copies/mL. A negative result does not preclude SARS-Cov-2 infection and should not be used as the sole basis for treatment or other patient management decisions. A negative result may occur with  improper specimen collection/handling, submission of specimen other than nasopharyngeal swab, presence of viral mutation(s) within the areas targeted by this assay, and inadequate number of viral copies(<138 copies/mL). A negative result must be combined with clinical observations, patient history, and epidemiological information. The expected result is Negative.  Fact Sheet for Patients:  EntrepreneurPulse.com.au  Fact Sheet for Healthcare Providers:  IncredibleEmployment.be  This test is no t yet approved or  cleared by the [place] FDA and  has been authorized for detection and/or diagnosis of SARS-CoV-2 by FDA under an Emergency Use Authorization (EUA). This EUA will remain  in effect (meaning this test can be used) for the duration of the COVID-19 declaration under Section 564(b)(1) of the Act, 21 U.S.C.section 360bbb-3(b)(1), unless the authorization is terminated  or revoked sooner.       Influenza A by PCR NEGATIVE NEGATIVE Final   Influenza B by PCR NEGATIVE NEGATIVE Final    Comment: (NOTE) The Xpert Xpress SARS-CoV-2/FLU/RSV plus assay is intended as an aid in the diagnosis of influenza from Nasopharyngeal swab specimens and should not be used as a sole basis for treatment. Nasal washings and aspirates are unacceptable for Xpert Xpress SARS-CoV-2/FLU/RSV testing.  Fact Sheet for Patients: EntrepreneurPulse.com.au  Fact Sheet for Healthcare Providers: IncredibleEmployment.be  This test is not yet approved or cleared by the [place] FDA and has been authorized for detection and/or diagnosis of SARS-CoV-2 by FDA under an Emergency Use Authorization (EUA). This EUA will remain in effect (meaning this test can be used) for the duration of the COVID-19 declaration under Section 564(b)(1) of the Act, 21 U.S.C. section 360bbb-3(b)(1), unless the authorization is terminated or revoked.  Performed at [facility], [address], [place], [place] [address]       Radiology Studies: DG Chest 1 View  Result Date: 09/22/2020 CLINICAL DATA:  Increasing shortness of breath for 2 days, worsening in the last hour, history of malignancy EXAM: CHEST  1 VIEW COMPARISON:  Radiograph 09/20/2020 FINDINGS: There has been fairly rapid interval development of mixed hazy interstitial and patchy opacities throughout both lungs accompanied by inter lobular septal thickening and fissural thickening with pulmonary vascular redistribution and  bilateral pleural effusions. Cardiomegaly is similar to prior with a calcified, tortuous aorta. Remaining cardiomediastinal contours are unremarkable. No acute osseous or soft tissue abnormality. Degenerative changes are present in the imaged spine and shoulders. Telemetry leads overlie the chest. IMPRESSION: Findings most compatible with CHF/volume overload with cardiomegaly, bilateral pleural effusions, and findings of pulmonary edema. More patchy opacities are favored to reflect developing alveolar edema though infection is difficult to fully exclude. Electronically Signed   By: Lovena Le M.D.   On: 09/22/2020 04:16   US Abdomen Complete  Result Date: 09/21/2020 CLINICAL DATA:  87 year old female with transaminitis. EXAM: ABDOMEN ULTRASOUND COMPLETE COMPARISON:  CT Abdomen and Pelvis 05/07/2013. FINDINGS: Gallbladder: Gallstones, individually estimated at 9 mm (image 3). Partially contracted gallbladder, wall thickness 3-4 mm. No pericholecystic  fluid. No sonographic Murphy sign elicited. Common bile duct: Diameter: 3 mm, normal. Liver: Background liver echogenicity is within normal limits. There is a questionable oval or triangular mixed echogenicity lesion in the subcapsular right lobe (image 40) without hypervascularity. This measures up to 1.9 cm might be artifact from the falciform ligament, uncertain. Portal vein is patent on color Doppler imaging with normal direction of blood flow towards the liver. IVC: No abnormality visualized. Pancreas: Visualized portion unremarkable. Spleen: Size and appearance within normal limits. Right Kidney: Length: 8.7 cm. Echogenicity within normal limits. No mass or hydronephrosis visualized. Left Kidney: Length: 8.9 cm. No left hydronephrosis. Round partially echogenic 2 cm area at the left renal midpole (image 64) is more suggestive of benign angiomyolipoma than renal cell carcinoma (image 68). No left hydronephrosis. No other discrete left renal mass. Abdominal  aorta: No aneurysm visualized. Other findings: No free fluid. IMPRESSION: 1. Cholelithiasis without strong evidence of acute cholecystitis at this time. 2. Indeterminate but probable benign roughly 2 cm lesions of the right hepatic lobe and the left kidney. Both are partially echogenic, which favors benign etiology such as hemangioma and angiomyolipoma (respectively). 3. Otherwise negative for age ultrasound appearance of the abdomen. Electronically Signed   By: Genevie Ann M.D.   On: 09/21/2020 11:54     Scheduled Meds: . amLODipine  10 mg Oral Daily  . brimonidine  1 drop Right Eye BID  . cholecalciferol  1,000 Units Oral Daily  . enoxaparin (LOVENOX) injection  30 mg Subcutaneous Q24H  . insulin aspart  0-6 Units Subcutaneous TID WC  . levothyroxine  75 mcg Oral QAC breakfast  . multivitamin-lutein  1 capsule Oral Daily  . omega-3 acid ethyl esters  1 g Oral BID  . prednisoLONE acetate  1 drop Left Eye Daily   Continuous Infusions:    LOS: 2 days     Enzo Bi, MD [facility] If 7PM-7AM, please contact night-coverage 09/22/2020, 2:56 PM

## 2020-09-22 NOTE — Evaluation (Signed)
Physical Therapy Evaluation Patient Details Name: Carrie [name] MRN: [id number] DOB: 05/13/1923 Today's Date: 09/22/2020   History of Present Illness  Carrie [name] is a 27yoF who comes to [facility] on 3/18 from PCP office 2/2 symptomatic bradycardia. Pt went to PCP for N/V/D, but found to have rates in 30s, 40s, c dizziness. PMH: AS - has declined AVR, HTN, hypoTSH. Symptoms and bradycardia improved with hydration and AMB, pt toleraing ~540ft c RW and mobility specialist on 3/19 AM, however in PM pt developed volume overload c SOB and CP. Pt followed by cardiology to determin need for PPM placement needs.  Clinical Impression  Pt admitted with above diagnosis. Pt currently with functional limitations due to the deficits listed below (see "PT Problem List"). Upon entry, pt in bed, awake and agreeable to participate (limited session)- pt feeling poorly after CHF exacerbation yesterday. Supportive, involved family at bedside. The pt is alert, pleasant, interactive, and able to provide info regarding prior level of function, both in tolerance and independence. Independent and robust bed mobility, HHA to come to standing. Pt not up for AMB at this time, but AMB 529ft yesterday. Pt on O2 at entry, sats WNL with activity. Patient's performance this date reveals decreased ability, independence, and tolerance in performing all basic mobility required for performance of activities of daily living. Pt requires additional DME, close physical assistance, and cues for safe participate in mobility. Pt will benefit from skilled PT intervention to increase independence and safety with basic mobility in preparation for discharge to the venue listed below.      Follow Up Recommendations Home health PT;Supervision for mobility/OOB    Equipment Recommendations  Rolling walker with 5" wheels    Recommendations for Other Services       Precautions / Restrictions Precautions Precautions: Fall Restrictions Weight Bearing  Restrictions: No      Mobility  Bed Mobility Overal bed mobility: Independent                  Transfers Overall transfer level: Needs assistance Equipment used: 1 person hand held assist Transfers: Sit to/from Stand Sit to Stand: Min assist         General transfer comment: pt uses HHA to come to standing in place. mild unsteadiness in standing for ~60sec.  Ambulation/Gait Ambulation/Gait assistance:  (eferred. Pt reports feeling poorly. AMB 522ft yesterday prior to CHF exacerbation.)              Stairs            Wheelchair Mobility    Modified Rankin (Stroke Patients Only)       Balance Overall balance assessment: Modified Independent;Mild deficits observed, not formally tested                                           Pertinent Vitals/Pain Pain Assessment: No/denies pain    Home Living Family/patient expects to be discharged to:: Private residence Living Arrangements: Alone Available Help at Discharge: Family;Available 24 hours/day Type of Home:  (condo) Home Access: Stairs to enter Entrance Stairs-Rails: Chemical engineer of Steps: 3 Home Layout: One level Home Equipment: Walker - 2 wheels;Cane - single point      Prior Function Level of Independence: Needs assistance   Gait / Transfers Assistance Needed: Walks 4x daily for 15 minutes  ADL's / Homemaking Assistance Needed: indepedent  Comments: Extended family  assists with transportation;     Hand Dominance        Extremity/Trunk Assessment   Upper Extremity Assessment Upper Extremity Assessment: Overall WFL for tasks assessed    Lower Extremity Assessment Lower Extremity Assessment: Overall WFL for tasks assessed;Generalized weakness    Cervical / Trunk Assessment Cervical / Trunk Assessment: Normal  Communication      Cognition Arousal/Alertness: Awake/alert Behavior During Therapy: WFL for tasks assessed/performed Overall  Cognitive Status: Within Functional Limits for tasks assessed                                        General Comments      Exercises     Assessment/Plan    PT Assessment Patient needs continued PT services  PT Problem List Decreased strength;Decreased range of motion;Decreased activity tolerance;Decreased balance;Decreased mobility       PT Treatment Interventions DME instruction;Balance training;Gait training;Functional mobility training;Therapeutic activities;Therapeutic exercise;Stair training;Patient/family education    PT Goals (Current goals can be found in the Care Plan section)  Acute Rehab PT Goals Patient Stated Goal: regain strength, return to home PT Goal Formulation: With patient Time For Goal Achievement: 10/06/20 Potential to Achieve Goals: Fair    Frequency Min 2X/week   Barriers to discharge        Co-evaluation               AM-PAC PT "6 Clicks" Mobility  Outcome Measure Help needed turning from your back to your side while in a flat bed without using bedrails?: None Help needed moving from lying on your back to sitting on the side of a flat bed without using bedrails?: None Help needed moving to and from a bed to a chair (including a wheelchair)?: A Little Help needed standing up from a chair using your arms (e.g., wheelchair or bedside chair)?: A Little Help needed to walk in hospital room?: A Little Help needed climbing 3-5 steps with a railing? : A Little 6 Click Score: 20    End of Session Equipment Utilized During Treatment: Oxygen Activity Tolerance: Patient tolerated treatment well;Patient limited by fatigue Patient left: in bed;with family/visitor present;with call bell/phone within reach   PT Visit Diagnosis: Difficulty in walking, not elsewhere classified (R26.2);Other abnormalities of gait and mobility (R26.89)    Time: 1326-1340 PT Time Calculation (min) (ACUTE ONLY): 14 min   Charges:   PT Evaluation $PT  Eval Moderate Complexity: 1 Mod          3:57 PM, 09/22/20 Etta Grandchild, PT, DPT Physical Therapist - [facility]  [phone] ([place])    [place] 09/22/2020, 3:55 PM

## 2020-09-22 NOTE — Progress Notes (Signed)
[facility] Encounter Note  Patient: Carrie [name] / Admit Date: 09/20/2020 / Date of Encounter: 09/22/2020, 7:31 AM   Subjective: 3/19.  Patient has done very well overnight with no evidence of significant syncope dizziness nausea diaphoresis or other cardiac symptoms.  Telemetry has shown sinus rhythm at 60 bpm.  Previous telemetry yesterday has shown symptomatic bradycardia with junctional escape at 30 beats to 40 bpm.  Patient has had better hydration and presented with dehydration as her more main complaint.  Currently that is improving at this time.  Troponin elevation most consistent with demand ischemia rather than acute coronary syndrome  3/20.  Patient has developed some hypoxia cough congestion and pulmonary edema.  Chest x-ray shows pulmonary edema with significant left chest crackles.  This is suggestive of diastolic dysfunction congestive heart failure's from severe aortic valve stenosis as well as recent rehydration from episode of dehydration.  Chronic kidney disease has improved with rehydration from GFR of 30-59.  Telemetry has shown that the patient is in normal sinus rhythm with no evidence of exacerbation of symptomatic bradycardia which may be isolated.  The patient will need further treatment of current condition before further consideration of invasive procedures.  Blood pressure has been relatively reasonable with amlodipine and cannot add beta-blocker due to recent of bradycardia Review of Systems: Positive for: Weakness Negative for: Vision change, hearing change, syncope, dizziness, nausea, vomiting,diarrhea, bloody stool, stomach pain, cough, congestion, diaphoresis, urinary frequency, urinary pain,skin lesions, skin rashes Others previously listed  Objective: Telemetry: Normal sinus rhythm Physical Exam: Blood pressure 130/61, pulse 67, temperature (!) 97.5 F (36.4 C), temperature source Oral, resp. rate 18, height 5\' 2"  (1.575 m), weight 48.2 kg,  SpO2 99 %. Body mass index is 19.44 kg/m. General: Well developed, well nourished, in no acute distress. Head: Normocephalic, atraumatic, sclera non-icteric, no xanthomas, nares are without discharge. Neck: No apparent masses Lungs: Normal respirations with no wheezes, no rhonchi, no rales , no crackles   Heart: Regular rate and rhythm, normal S1 ABSENT S2, 4+ aortic murmur, no rub, no gallop, PMI is normal size and placement, carotid upstroke normal with bruit, jugular venous pressure normal Abdomen: Soft, non-tender, non-distended with normoactive bowel sounds. No hepatosplenomegaly. Abdominal aorta is normal size without bruit Extremities: No edema, no clubbing, no cyanosis, no ulcers,  Peripheral: 2+ radial, 2+ femoral, 2+ dorsal pedal pulses Neuro: Alert and oriented. Moves all extremities spontaneously. Psych:  Responds to questions appropriately with a normal affect.   Intake/Output Summary (Last 24 hours) at 09/22/2020 0731 Last data filed at 09/22/2020 0540 Gross per 24 hour  Intake 901.95 ml  Output 1053 ml  Net -151.05 ml    Inpatient Medications:  . amLODipine  10 mg Oral Daily  . brimonidine  1 drop Right Eye BID  . cholecalciferol  1,000 Units Oral Daily  . enoxaparin (LOVENOX) injection  30 mg Subcutaneous Q24H  . insulin aspart  0-6 Units Subcutaneous TID WC  . levothyroxine  75 mcg Oral QAC breakfast  . multivitamin-lutein  1 capsule Oral Daily  . omega-3 acid ethyl esters  1 g Oral BID  . prednisoLONE acetate  1 drop Left Eye Daily   Infusions:   Labs: Recent Labs    09/20/20 1138 09/20/20 1144 09/20/20 1144 09/21/20 0403 09/22/20 0408  NA  --  127*   < > 127* 127*  K  --  5.8*   < > 5.0 4.7  CL  --  97*   < >  98 96*  CO2  --  22   < > 20* 22  GLUCOSE  --  171*   < > 132* 156*  BUN  --  43*   < > 50* 37*  CREATININE  --  1.50*   < > 1.59* 1.03*  CALCIUM  --  9.2   < > 8.8* 8.6*  MG 2.6*  --   --   --  2.0  PHOS  --  4.9*  --   --   --    < > =  values in this interval not displayed.   Recent Labs    09/20/20 1144 09/21/20 0403  AST 199* 113*  ALT 222* 166*  ALKPHOS 94 84  BILITOT 0.9 1.0  PROT 6.6 5.9*  ALBUMIN 4.1 3.7   Recent Labs    09/20/20 1144 09/21/20 0403 09/22/20 0408  WBC 10.9* 11.9* 18.3*  NEUTROABS 8.1* 8.8*  --   HGB 12.2 11.6* 12.7  HCT 37.6 33.4* 36.8  MCV 91.5 89.5 87.6  PLT 188 148* 166   No results for input(s): CKTOTAL, CKMB, TROPONINI in the last 72 hours. Invalid input(s): POCBNP Recent Labs    09/20/20 1144  HGBA1C 5.4     Weights: Filed Weights   09/20/20 1134 09/21/20 0428  Weight: 45.4 kg 48.2 kg     Radiology/Studies:  DG Chest 1 View  Result Date: 09/22/2020 CLINICAL DATA:  Increasing shortness of breath for 2 days, worsening in the last hour, history of malignancy EXAM: CHEST  1 VIEW COMPARISON:  Radiograph 09/20/2020 FINDINGS: There has been fairly rapid interval development of mixed hazy interstitial and patchy opacities throughout both lungs accompanied by inter lobular septal thickening and fissural thickening with pulmonary vascular redistribution and bilateral pleural effusions. Cardiomegaly is similar to prior with a calcified, tortuous aorta. Remaining cardiomediastinal contours are unremarkable. No acute osseous or soft tissue abnormality. Degenerative changes are present in the imaged spine and shoulders. Telemetry leads overlie the chest. IMPRESSION: Findings most compatible with CHF/volume overload with cardiomegaly, bilateral pleural effusions, and findings of pulmonary edema. More patchy opacities are favored to reflect developing alveolar edema though infection is difficult to fully exclude. Electronically Signed   By: Lovena Le M.D.   On: 09/22/2020 04:16   US Abdomen Complete  Result Date: 09/21/2020 CLINICAL DATA:  87 year old female with transaminitis. EXAM: ABDOMEN ULTRASOUND COMPLETE COMPARISON:  CT Abdomen and Pelvis 05/07/2013. FINDINGS: Gallbladder:  Gallstones, individually estimated at 9 mm (image 3). Partially contracted gallbladder, wall thickness 3-4 mm. No pericholecystic fluid. No sonographic Murphy sign elicited. Common bile duct: Diameter: 3 mm, normal. Liver: Background liver echogenicity is within normal limits. There is a questionable oval or triangular mixed echogenicity lesion in the subcapsular right lobe (image 40) without hypervascularity. This measures up to 1.9 cm might be artifact from the falciform ligament, uncertain. Portal vein is patent on color Doppler imaging with normal direction of blood flow towards the liver. IVC: No abnormality visualized. Pancreas: Visualized portion unremarkable. Spleen: Size and appearance within normal limits. Right Kidney: Length: 8.7 cm. Echogenicity within normal limits. No mass or hydronephrosis visualized. Left Kidney: Length: 8.9 cm. No left hydronephrosis. Round partially echogenic 2 cm area at the left renal midpole (image 64) is more suggestive of benign angiomyolipoma than renal cell carcinoma (image 68). No left hydronephrosis. No other discrete left renal mass. Abdominal aorta: No aneurysm visualized. Other findings: No free fluid. IMPRESSION: 1. Cholelithiasis without strong evidence of acute cholecystitis at this time.  2. Indeterminate but probable benign roughly 2 cm lesions of the right hepatic lobe and the left kidney. Both are partially echogenic, which favors benign etiology such as hemangioma and angiomyolipoma (respectively). 3. Otherwise negative for age ultrasound appearance of the abdomen. Electronically Signed   By: Genevie Ann M.D.   On: 09/21/2020 11:54   DG Chest Portable 1 View  Result Date: 09/20/2020 CLINICAL DATA:  Weakness, bradycardia, nausea, vomiting and diarrhea. EXAM: PORTABLE CHEST 1 VIEW COMPARISON:  None. FINDINGS: Trachea is midline. Heart is enlarged. Thoracic aorta is calcified. Defibrillator screw pad overlies the upper left chest. Streaky densities in the lung  bases. There may be a tiny left pleural effusion. No pneumothorax. High riding humeral heads bilaterally with degenerative changes. IMPRESSION: 1. Mild bibasilar atelectasis, right greater than left. 2. Possible tiny left pleural effusion. 3.  Aortic atherosclerosis (ICD10-I70.0). Electronically Signed   By: Lorin Picket M.D.   On: 09/20/2020 12:09     Assessment and Recommendation  87 y.o. female with known severe aortic valve stenosis and hypertension having likely acute dehydration and acute kidney injury now slowly improving with new onset symptomatic bradycardia with junctional escape now resolved.  But, patient now with acute evidence of congestive heart failure and pulmonary edema 1.  Gentle diuresis with intravenous Lasix again this a.m. 2.  Further consideration of Micra pacemaker placement for episode of symptomatic bradycardia although if fully resolved with ambulation and addition of hydration and treatment but concerns of congestive heart failure putting patient at higher risk of procedure and may need further treatment of heart failure prior to further invasive procedures due to patient's significant age and severe aortic valve stenosis.  Plan for further discussion with electrophysiology and cardiology partners.  May be able to defer to outpatient 3.  Begin ambulation and follow-up for improvements of symptoms  Signed, Serafina Royals M.D. FACC

## 2020-09-23 DIAGNOSIS — I4891 Unspecified atrial fibrillation: Secondary | ICD-10-CM | POA: Diagnosis not present

## 2020-09-23 DIAGNOSIS — R001 Bradycardia, unspecified: Secondary | ICD-10-CM | POA: Diagnosis not present

## 2020-09-23 LAB — GLUCOSE, CAPILLARY
Glucose-Capillary: 104 mg/dL — ABNORMAL HIGH (ref 70–99)
Glucose-Capillary: 133 mg/dL — ABNORMAL HIGH (ref 70–99)
Glucose-Capillary: 101 mg/dL — ABNORMAL HIGH (ref 70–99)

## 2020-09-23 LAB — BASIC METABOLIC PANEL
Anion gap: 7 (ref 5–15)
BUN: 26 mg/dL — ABNORMAL HIGH (ref 8–23)
CO2: 28 mmol/L (ref 22–32)
Calcium: 8.4 mg/dL — ABNORMAL LOW (ref 8.9–10.3)
Chloride: 96 mmol/L — ABNORMAL LOW (ref 98–111)
Creatinine, Ser: 0.96 mg/dL (ref 0.44–1.00)
GFR, Estimated: 54 mL/min — ABNORMAL LOW (ref >60)
Glucose, Bld: 104 mg/dL — ABNORMAL HIGH (ref 70–99)
Potassium: 3.8 mmol/L (ref 3.5–5.1)
Sodium: 131 mmol/L — ABNORMAL LOW (ref 135–145)

## 2020-09-23 LAB — CBC
HCT: 35.1 % — ABNORMAL LOW (ref 36.0–46.0)
Hemoglobin: 11.8 g/dL — ABNORMAL LOW (ref 12.0–15.0)
MCH: 29.9 pg (ref 26.0–34.0)
MCHC: 33.6 g/dL (ref 30.0–36.0)
MCV: 88.9 fL (ref 80.0–100.0)
Platelets: 172 10*3/uL (ref 150–400)
RBC: 3.95 MIL/uL (ref 3.87–5.11)
RDW: 14.2 % (ref 11.5–15.5)
WBC: 12.1 10*3/uL — ABNORMAL HIGH (ref 4.0–10.5)
nRBC: 0 % (ref 0.0–0.2)

## 2020-09-23 LAB — MAGNESIUM: Magnesium: 2.1 mg/dL (ref 1.7–2.4)

## 2020-09-23 MED ORDER — AMIODARONE IV BOLUS ONLY 150 MG/100ML
150.0000 mg | Freq: Once | INTRAVENOUS | Status: AC
  Administered 2020-09-23: 150 mg via INTRAVENOUS

## 2020-09-23 MED ORDER — AMIODARONE HCL IN DEXTROSE 360-4.14 MG/200ML-% IV SOLN
60.0000 mg/h | INTRAVENOUS | Status: AC
  Administered 2020-09-23: 60 mg/h via INTRAVENOUS
  Filled 2020-09-23 (×2): qty 200

## 2020-09-23 MED ORDER — AMIODARONE HCL IN DEXTROSE 360-4.14 MG/200ML-% IV SOLN
30.0000 mg/h | INTRAVENOUS | Status: DC
  Administered 2020-09-23: 30 mg/h via INTRAVENOUS
  Filled 2020-09-23: qty 200

## 2020-09-23 NOTE — Progress Notes (Signed)
[facility] Encounter Note  Patient: Carrie [name] / Admit Date: 09/20/2020 / Date of Encounter: 09/23/2020, 12:29 PM   Subjective: 3/19.  Patient has done very well overnight with no evidence of significant syncope dizziness nausea diaphoresis or other cardiac symptoms.  Telemetry has shown sinus rhythm at 60 bpm.  Previous telemetry yesterday has shown symptomatic bradycardia with junctional escape at 30 beats to 40 bpm.  Patient has had better hydration and presented with dehydration as her more main complaint.  Currently that is improving at this time.  Troponin elevation most consistent with demand ischemia rather than acute coronary syndrome  3/20.  Patient has developed some hypoxia cough congestion and pulmonary edema.  Chest x-ray shows pulmonary edema with significant left chest crackles.  This is suggestive of diastolic dysfunction congestive heart failure's from severe aortic valve stenosis as well as recent rehydration from episode of dehydration.  Chronic kidney disease has improved with rehydration from GFR of 30-59.  Telemetry has shown that the patient is in normal sinus rhythm with no evidence of exacerbation of symptomatic bradycardia which may be isolated.  The patient will need further treatment of current condition before further consideration of invasive procedures.  Blood pressure has been relatively reasonable with amlodipine and cannot add beta-blocker due to recent of bradycardia  3/21.  Patient has had resolution of significant issues of pulmonary edema after rehydration from admission.  Breathing much better at this time and less short of breath.  Telemetry has shown no evidence of significant rhythm disturbances bradycardia or advanced heart block.  Late this morning patient had new onset of atrial fibrillation with rapid ventricular rate but no significant symptoms of angina shortness of breath congestive heart failure or dizziness.  Amiodarone drip was  started for further treatment of atrial fibrillation and blood pressure medications have been deferred for now.  Patient is currently tolerating current treatment and further discussion will be needed with family and herself for other treatment options of the above Review of Systems: Positive for: Weakness Negative for: Vision change, hearing change, syncope, dizziness, nausea, vomiting,diarrhea, bloody stool, stomach pain, cough, congestion, diaphoresis, urinary frequency, urinary pain,skin lesions, skin rashes Others previously listed  Objective: Telemetry: Normal sinus rhythm Physical Exam: Blood pressure 107/75, pulse (!) 130, temperature 97.8 F (36.6 C), resp. rate 18, height 5\' 2"  (1.575 m), weight 48.2 kg, SpO2 95 %. Body mass index is 19.44 kg/m. General: Well developed, well nourished, in no acute distress. Head: Normocephalic, atraumatic, sclera non-icteric, no xanthomas, nares are without discharge. Neck: No apparent masses Lungs: Normal respirations with no wheezes, no rhonchi, no rales , few basilar crackles   Heart: Irregular rate and rhythm, normal S1 ABSENT S2, 4+ aortic murmur, no rub, no gallop, PMI is normal size and placement, carotid upstroke normal with bruit, jugular venous pressure normal Abdomen: Soft, non-tender, non-distended with normoactive bowel sounds. No hepatosplenomegaly. Abdominal aorta is normal size without bruit Extremities: No edema, no clubbing, no cyanosis, no ulcers,  Peripheral: 2+ radial, 2+ femoral, 2+ dorsal pedal pulses Neuro: Alert and oriented. Moves all extremities spontaneously. Psych:  Responds to questions appropriately with a normal affect.   Intake/Output Summary (Last 24 hours) at 09/23/2020 1229 Last data filed at 09/23/2020 0950 Gross per 24 hour  Intake 240 ml  Output 650 ml  Net -410 ml    Inpatient Medications:  . brimonidine  1 drop Right Eye BID  . cholecalciferol  1,000 Units Oral Daily  . enoxaparin (LOVENOX) injection  30 mg Subcutaneous Q24H  . insulin aspart  0-6 Units Subcutaneous TID WC  . levothyroxine  75 mcg Oral QAC breakfast  . multivitamin-lutein  1 capsule Oral Daily  . omega-3 acid ethyl esters  1 g Oral BID  . prednisoLONE acetate  1 drop Left Eye Daily   Infusions:  . amiodarone 60 mg/hr (09/23/20 0906)  . amiodarone      Labs: Recent Labs    09/22/20 0408 09/23/20 0621  NA 127* 131*  K 4.7 3.8  CL 96* 96*  CO2 22 28  GLUCOSE 156* 104*  BUN 37* 26*  CREATININE 1.03* 0.96  CALCIUM 8.6* 8.4*  MG 2.0 2.1   Recent Labs    09/21/20 0403  AST 113*  ALT 166*  ALKPHOS 84  BILITOT 1.0  PROT 5.9*  ALBUMIN 3.7   Recent Labs    09/21/20 0403 09/22/20 0408 09/23/20 0621  WBC 11.9* 18.3* 12.1*  NEUTROABS 8.8*  --   --   HGB 11.6* 12.7 11.8*  HCT 33.4* 36.8 35.1*  MCV 89.5 87.6 88.9  PLT 148* 166 172   No results for input(s): CKTOTAL, CKMB, TROPONINI in the last 72 hours. Invalid input(s): POCBNP No results for input(s): HGBA1C in the last 72 hours.   Weights: Filed Weights   09/20/20 1134 09/21/20 0428  Weight: 45.4 kg 48.2 kg     Radiology/Studies:  DG Chest 1 View  Result Date: 09/22/2020 CLINICAL DATA:  Increasing shortness of breath for 2 days, worsening in the last hour, history of malignancy EXAM: CHEST  1 VIEW COMPARISON:  Radiograph 09/20/2020 FINDINGS: There has been fairly rapid interval development of mixed hazy interstitial and patchy opacities throughout both lungs accompanied by inter lobular septal thickening and fissural thickening with pulmonary vascular redistribution and bilateral pleural effusions. Cardiomegaly is similar to prior with a calcified, tortuous aorta. Remaining cardiomediastinal contours are unremarkable. No acute osseous or soft tissue abnormality. Degenerative changes are present in the imaged spine and shoulders. Telemetry leads overlie the chest. IMPRESSION: Findings most compatible with CHF/volume overload with cardiomegaly,  bilateral pleural effusions, and findings of pulmonary edema. More patchy opacities are favored to reflect developing alveolar edema though infection is difficult to fully exclude. Electronically Signed   By: Lovena Le M.D.   On: 09/22/2020 04:16   US Abdomen Complete  Result Date: 09/21/2020 CLINICAL DATA:  87 year old female with transaminitis. EXAM: ABDOMEN ULTRASOUND COMPLETE COMPARISON:  CT Abdomen and Pelvis 05/07/2013. FINDINGS: Gallbladder: Gallstones, individually estimated at 9 mm (image 3). Partially contracted gallbladder, wall thickness 3-4 mm. No pericholecystic fluid. No sonographic Murphy sign elicited. Common bile duct: Diameter: 3 mm, normal. Liver: Background liver echogenicity is within normal limits. There is a questionable oval or triangular mixed echogenicity lesion in the subcapsular right lobe (image 40) without hypervascularity. This measures up to 1.9 cm might be artifact from the falciform ligament, uncertain. Portal vein is patent on color Doppler imaging with normal direction of blood flow towards the liver. IVC: No abnormality visualized. Pancreas: Visualized portion unremarkable. Spleen: Size and appearance within normal limits. Right Kidney: Length: 8.7 cm. Echogenicity within normal limits. No mass or hydronephrosis visualized. Left Kidney: Length: 8.9 cm. No left hydronephrosis. Round partially echogenic 2 cm area at the left renal midpole (image 64) is more suggestive of benign angiomyolipoma than renal cell carcinoma (image 68). No left hydronephrosis. No other discrete left renal mass. Abdominal aorta: No aneurysm visualized. Other findings: No free fluid. IMPRESSION: 1. Cholelithiasis without strong evidence  of acute cholecystitis at this time. 2. Indeterminate but probable benign roughly 2 cm lesions of the right hepatic lobe and the left kidney. Both are partially echogenic, which favors benign etiology such as hemangioma and angiomyolipoma (respectively). 3. Otherwise  negative for age ultrasound appearance of the abdomen. Electronically Signed   By: Genevie Ann M.D.   On: 09/21/2020 11:54   DG Chest Portable 1 View  Result Date: 09/20/2020 CLINICAL DATA:  Weakness, bradycardia, nausea, vomiting and diarrhea. EXAM: PORTABLE CHEST 1 VIEW COMPARISON:  None. FINDINGS: Trachea is midline. Heart is enlarged. Thoracic aorta is calcified. Defibrillator screw pad overlies the upper left chest. Streaky densities in the lung bases. There may be a tiny left pleural effusion. No pneumothorax. High riding humeral heads bilaterally with degenerative changes. IMPRESSION: 1. Mild bibasilar atelectasis, right greater than left. 2. Possible tiny left pleural effusion. 3.  Aortic atherosclerosis (ICD10-I70.0). Electronically Signed   By: Lorin Picket M.D.   On: 09/20/2020 12:09     Assessment and Recommendation  87 y.o. female with known severe aortic valve stenosis and hypertension having likely acute dehydration and acute kidney injury with slow improvements with hydration and then pulmonary edema with too much hydration.  Then the patient now has atrial fibrillation rapid ventricular rate now improving with amiodarone and no evidence of acute coronary syndrome at this time.   1.  Continue to follow for any pulmonary edema that needs gentle diuresis 2.  Defer Micra pacemaker placement at this time due to no current evidence of sinus node dysfunction complete heart block and/or symptomatic bradycardia since admission.  Patient has other significant issues that may need to be addressed prior to this issue.  Further consultation with other electrophysiology suggest more medical management and/or surgical discussion rather than pacemaker placement at this time.   3.  Continuation of amiodarone drip for atrial fibrillation rapid ventricular rate watching for any significant progression of heart block thereafter 4.  Further discussion with other family members about continued current issues  listed above and concerns for long-term outcomes before further invasive procedures are contemplated  Signed, Serafina Royals M.D. FACC

## 2020-09-23 NOTE — Care Management Important Message (Signed)
Important Message  Patient Details  Name: Carrie [name] MRN: [id number] Date of Birth: January 19, 1923   Medicare Important Message Given:  Yes     Dannette Barbara 09/23/2020, 12:06 PM

## 2020-09-23 NOTE — Progress Notes (Addendum)
PROGRESS NOTE    Carrie [name]  UUV:[id number] DOB: [birth year] DOA: 09/20/2020 PCP: Marinda Elk, MD  [phone]   Assessment & Plan:   Active Problems:   Symptomatic bradycardia   Carrie [name] is a 87 y.o. female with medical history significant for severe aortic stenosis, has declined aortic valve replacement in the past, essential hypertension, hypothyroidism, who presented to [facility] ED at her PCPs recommendation due to symptomatic bradycardia.  Patient initially presented to her PCPs office due to sudden onset of diarrhea last night associated with nausea and vomiting.    At PCPs office, there were 10 vital signs revealed heart rate in the 30s and 40s.  She felt dizzy therefore was sent to the ED for further evaluation.  Cardiology Dr. Tamala Ser was contacted who reviewed EKG and recommended admission for pacemaker placement.  She denies having any syncopal episodes.  She admits to intermittent chest discomfort chest tightness for the past 2 to 3 months.     Symptomatic bradycardia with concern for advanced heart block --Dr. Nehemiah Massed saw pt.  "Defer Micra pacemaker placement at this time due to no current evidence of sinus node dysfunction complete heart block and/or symptomatic bradycardia since admission." --hold all beta blocker  Afib w RVR, not POA --started morning of 3/21. --start amiodarone bolus + gtt, by cardiology  Severe aortic stenosis, has declined aortic valve replacement in the past. Management per cardiology. --Avoid IVF  Acute hypoxic respiratory failure 2/2 pulm edema 2/2 Acute on chronic CHF with preserved EF --developed early morning 3/20, caused by gentle IVF hydration in the setting of congestive heart failure due to severe aortic stenosis. --s/p IV lasix 20 mg x1 and oral lasix 40 mg x1 Plan: --Hold all IVF --Continue supplemental O2 to keep sats >=92%, wean as tolerated  Elevated troponin likely demand ischemia in the setting of severe  aortic stenosis Troponin 47 from 36.  She denies any chest pain at a time of this visit.  Acute transaminitis suspect related to her severe aortic stenosis with likely liver congestion Elevated LFTs. Abdominal US showed Cholelithiasis without strong evidence of acute cholecystitis.  Euvolemic hyponatremia Presented with serum sodium 127, which didn't improve with MIVF.  AKI, POA CKD 3B Baseline creatinine appears to be 1.0 with GFR 47 Presented with creatinine of 1.5 with GFR of 32. She is on p.o. Lasix as needed at home. --Cr improved with MIVF Plan: --Hold home Lasix and benazepril --hold further MIVF  Hyperkalemia in the setting of acute renal failure, resolved Presented with serum potassium 5.8 Treatment 1 dose of Lokelma.  Hyperglycemia, no prior reported history of diabetes --A1c 5.4.  Has not needed much SSI --d/c fingersticks and SSI  Acute urinary retention, ruled out  Leukocytosis WBC 10.9, afebrile.   --CXR showed "patchy opacities are favored to reflect developing alveolar edema though infection is difficult to fully exclude." --procal obtained this morning, 0.13. --hold abx for now as no source of infection and procal low.  HTN --BP meds held on presentation --BP low this morning --d/c amlodipine --Hold home lasix and benazepril due to AKI   DVT prophylaxis: Lovenox SQ Code Status: DNR  Family Communication: nephew updated at bedside today Level of care: Progressive Cardiac Dispo:   The patient is from: home Anticipated d/c is to: home Anticipated d/c date is: 1-2 days Patient currently is not medically ready to d/c due to: pending eval for pacemaker, and weaning down new O2 requirement, and treatment of Afib w RVR with  IV amiodarone   Subjective and Interval History:  Dyspnea improved.  This morning, pt went into Afib w RVR.  Pt denied chest pain or palpitation.    Objective: Vitals:   09/23/20 1135 09/23/20 1230 09/23/20 1340 09/23/20  1504  BP: 107/75 (!) 113/93 108/63 (!) 134/57  Pulse: (!) 130 (!) 120 67 65  Resp: 18 18  16   Temp: 97.8 F (36.6 C) 97.8 F (36.6 C) 97.8 F (36.6 C) 98.3 F (36.8 C)  TempSrc:    Oral  SpO2: 95% 93% 93% 100%  Weight:      Height:        Intake/Output Summary (Last 24 hours) at 09/23/2020 1712 Last data filed at 09/23/2020 1345 Gross per 24 hour  Intake 480 ml  Output 650 ml  Net -170 ml   Filed Weights   09/20/20 1134 09/21/20 0428  Weight: 45.4 kg 48.2 kg    Examination:   Constitutional: NAD, AAOx3 HEENT: legally blind CV: No cyanosis.   RESP: normal respiratory effort, on 2L Extremities: No effusions, edema in BLE SKIN: warm, dry, thin skin over BLE Neuro: II - XII grossly intact.   Psych: Normal mood and affect.  Appropriate judgement and reason    Data Reviewed: I have personally reviewed following labs and imaging studies  CBC: Recent Labs  Lab 09/20/20 1144 09/21/20 0403 09/22/20 0408 09/23/20 0621  WBC 10.9* 11.9* 18.3* 12.1*  NEUTROABS 8.1* 8.8*  --   --   HGB 12.2 11.6* 12.7 11.8*  HCT 37.6 33.4* 36.8 35.1*  MCV 91.5 89.5 87.6 88.9  PLT 188 148* 166 269   Basic Metabolic Panel: Recent Labs  Lab 09/20/20 1138 09/20/20 1144 09/21/20 0403 09/22/20 0408 09/23/20 0621  NA  --  127* 127* 127* 131*  K  --  5.8* 5.0 4.7 3.8  CL  --  97* 98 96* 96*  CO2  --  22 20* 22 28  GLUCOSE  --  171* 132* 156* 104*  BUN  --  43* 50* 37* 26*  CREATININE  --  1.50* 1.59* 1.03* 0.96  CALCIUM  --  9.2 8.8* 8.6* 8.4*  MG 2.6*  --   --  2.0 2.1  PHOS  --  4.9*  --   --   --    GFR: Estimated Creatinine Clearance: 26.7 mL/min (by C-G formula based on SCr of 0.96 mg/dL). Liver Function Tests: Recent Labs  Lab 09/20/20 1144 09/21/20 0403  AST 199* 113*  ALT 222* 166*  ALKPHOS 94 84  BILITOT 0.9 1.0  PROT 6.6 5.9*  ALBUMIN 4.1 3.7   No results for input(s): LIPASE, AMYLASE in the last 168 hours. No results for input(s): AMMONIA in the last 168  hours. Coagulation Profile: No results for input(s): INR, PROTIME in the last 168 hours. Cardiac Enzymes: No results for input(s): CKTOTAL, CKMB, CKMBINDEX, TROPONINI in the last 168 hours. BNP (last 3 results) No results for input(s): PROBNP in the last 8760 hours. HbA1C: No results for input(s): HGBA1C in the last 72 hours. CBG: Recent Labs  Lab 09/22/20 1705 09/22/20 2049 09/23/20 0809 09/23/20 1154 09/23/20 1705  GLUCAP 113* 114* 104* 133* 101*   Lipid Profile: No results for input(s): CHOL, HDL, LDLCALC, TRIG, CHOLHDL, LDLDIRECT in the last 72 hours. Thyroid Function Tests: No results for input(s): TSH, T4TOTAL, FREET4, T3FREE, THYROIDAB in the last 72 hours. Anemia Panel: No results for input(s): VITAMINB12, FOLATE, FERRITIN, TIBC, IRON, RETICCTPCT in the last 72 hours. Sepsis  Labs: Recent Labs  Lab 09/22/20 0408  PROCALCITON 0.13    Recent Results (from the past 240 hour(s))  Resp Panel by RT-PCR (Flu A&B, Covid) Nasopharyngeal Swab     Status: None   Collection Time: 09/20/20 12:00 PM   Specimen: Nasopharyngeal Swab; Nasopharyngeal(NP) swabs in vial transport medium  Result Value Ref Range Status   SARS Coronavirus 2 by RT PCR NEGATIVE NEGATIVE Final    Comment: (NOTE) SARS-CoV-2 target nucleic acids are NOT DETECTED.  The SARS-CoV-2 RNA is generally detectable in upper respiratory specimens during the acute phase of infection. The lowest concentration of SARS-CoV-2 viral copies this assay can detect is 138 copies/mL. A negative result does not preclude SARS-Cov-2 infection and should not be used as the sole basis for treatment or other patient management decisions. A negative result may occur with  improper specimen collection/handling, submission of specimen other than nasopharyngeal swab, presence of viral mutation(s) within the areas targeted by this assay, and inadequate number of viral copies(<138 copies/mL). A negative result must be combined  with clinical observations, patient history, and epidemiological information. The expected result is Negative.  Fact Sheet for Patients:  EntrepreneurPulse.com.au  Fact Sheet for Healthcare Providers:  IncredibleEmployment.be  This test is no t yet approved or cleared by the [place] FDA and  has been authorized for detection and/or diagnosis of SARS-CoV-2 by FDA under an Emergency Use Authorization (EUA). This EUA will remain  in effect (meaning this test can be used) for the duration of the COVID-19 declaration under Section 564(b)(1) of the Act, 21 U.S.C.section 360bbb-3(b)(1), unless the authorization is terminated  or revoked sooner.       Influenza A by PCR NEGATIVE NEGATIVE Final   Influenza B by PCR NEGATIVE NEGATIVE Final    Comment: (NOTE) The Xpert Xpress SARS-CoV-2/FLU/RSV plus assay is intended as an aid in the diagnosis of influenza from Nasopharyngeal swab specimens and should not be used as a sole basis for treatment. Nasal washings and aspirates are unacceptable for Xpert Xpress SARS-CoV-2/FLU/RSV testing.  Fact Sheet for Patients: EntrepreneurPulse.com.au  Fact Sheet for Healthcare Providers: IncredibleEmployment.be  This test is not yet approved or cleared by the [place] FDA and has been authorized for detection and/or diagnosis of SARS-CoV-2 by FDA under an Emergency Use Authorization (EUA). This EUA will remain in effect (meaning this test can be used) for the duration of the COVID-19 declaration under Section 564(b)(1) of the Act, 21 U.S.C. section 360bbb-3(b)(1), unless the authorization is terminated or revoked.  Performed at [facility], [address], [place], [place] [address]       Radiology Studies: DG Chest 1 View  Result Date: 09/22/2020 CLINICAL DATA:  Increasing shortness of breath for 2 days, worsening in the last hour, history of  malignancy EXAM: CHEST  1 VIEW COMPARISON:  Radiograph 09/20/2020 FINDINGS: There has been fairly rapid interval development of mixed hazy interstitial and patchy opacities throughout both lungs accompanied by inter lobular septal thickening and fissural thickening with pulmonary vascular redistribution and bilateral pleural effusions. Cardiomegaly is similar to prior with a calcified, tortuous aorta. Remaining cardiomediastinal contours are unremarkable. No acute osseous or soft tissue abnormality. Degenerative changes are present in the imaged spine and shoulders. Telemetry leads overlie the chest. IMPRESSION: Findings most compatible with CHF/volume overload with cardiomegaly, bilateral pleural effusions, and findings of pulmonary edema. More patchy opacities are favored to reflect developing alveolar edema though infection is difficult to fully exclude. Electronically Signed   By: March [facility]umm[facility] M.D.   On: 09/22/2020 04:16     Scheduled Meds:  brimonidine  1 drop Right Eye BID   cholecalciferol  1,000 Units Oral Daily   enoxaparin (LOVENOX) injection  30 mg Subcutaneous Q24H   insulin aspart  0-6 Units Subcutaneous TID WC   levothyroxine  75 mcg Oral QAC breakfast   multivitamin-lutein  1 capsule Oral Daily   omega-3 acid ethyl esters  1 g Oral BID   prednisoLONE acetate  1 drop Left Eye Daily   Continuous Infusions:  amiodarone 30 mg/hr (09/23/20 1528)     LOS: 3 days     Enzo Bi, MD [facility] If 7PM-7AM, please contact night-coverage 09/23/2020, 5:12 PM

## 2020-09-23 NOTE — Progress Notes (Signed)
Mobility Specialist - Progress Note    09/23/20 1100  Mobility  Activity Contraindicated/medical hold  Mobility performed by Mobility specialist    Per discussion with nurse, hold mobility this date d/t elevated HR + amio. Will attempt session when medically appropriate.    Kathee Delton Mobility Specialist 09/23/20, 11:21 AM

## 2020-09-23 NOTE — Progress Notes (Signed)
CCMD called to report 4.55 sec pause on tele -pt asymptomatic/ MD aware  pts BP 90s sys= MD aware/ orders to continue amio gtt/ will monitor close

## 2020-09-23 NOTE — Progress Notes (Addendum)
CCMD called to report pts HR is sustaining in 170s-180s/ RN to bedside to assess/ pt asymptomatic / EKG obtained/ afib rvr 160s/ MD made aware / Judson Roch , CCU charge nurse, made aware- will continue to monitor close.   Dr. Nehemiah Massed to bedside / orders to start amio gtt/ gtt started/ will monitor.

## 2020-09-23 NOTE — Plan of Care (Signed)
  Problem: Health Behavior/Discharge Planning: Goal: Ability to manage health-related needs will improve Outcome: Progressing   Problem: Clinical Measurements: Goal: Ability to maintain clinical measurements within normal limits will improve Outcome: Progressing   Problem: Activity: Goal: Risk for activity intolerance will decrease Outcome: Progressing   Problem: Cardiac: Goal: Ability to achieve and maintain adequate cardiopulmonary perfusion will improve Outcome: Progressing

## 2020-09-24 LAB — CBC
HCT: 39.2 % (ref 36.0–46.0)
Hemoglobin: 13.3 g/dL (ref 12.0–15.0)
MCH: 30.1 pg (ref 26.0–34.0)
MCHC: 33.9 g/dL (ref 30.0–36.0)
MCV: 88.7 fL (ref 80.0–100.0)
Platelets: 203 10*3/uL (ref 150–400)
RBC: 4.42 MIL/uL (ref 3.87–5.11)
RDW: 14.2 % (ref 11.5–15.5)
WBC: 15.3 10*3/uL — ABNORMAL HIGH (ref 4.0–10.5)
nRBC: 0 % (ref 0.0–0.2)

## 2020-09-24 LAB — BASIC METABOLIC PANEL
Anion gap: 9 (ref 5–15)
BUN: 29 mg/dL — ABNORMAL HIGH (ref 8–23)
CO2: 25 mmol/L (ref 22–32)
Calcium: 8.9 mg/dL (ref 8.9–10.3)
Chloride: 94 mmol/L — ABNORMAL LOW (ref 98–111)
Creatinine, Ser: 1.09 mg/dL — ABNORMAL HIGH (ref 0.44–1.00)
GFR, Estimated: 47 mL/min — ABNORMAL LOW (ref >60)
Glucose, Bld: 116 mg/dL — ABNORMAL HIGH (ref 70–99)
Potassium: 3.8 mmol/L (ref 3.5–5.1)
Sodium: 128 mmol/L — ABNORMAL LOW (ref 135–145)

## 2020-09-24 LAB — MAGNESIUM: Magnesium: 2.3 mg/dL (ref 1.7–2.4)

## 2020-09-24 MED ORDER — AMLODIPINE BESY-BENAZEPRIL HCL 5-10 MG PO CAPS: ORAL_CAPSULE | ORAL | Status: DC

## 2020-09-24 MED ORDER — POLYETHYLENE GLYCOL 3350 17 G PO PACK: 17.0000 g | PACK | Freq: Two times a day (BID) | ORAL | Status: DC | PRN

## 2020-09-24 MED ORDER — MELOXICAM 7.5 MG PO TABS: 7.5000 mg | ORAL_TABLET | Freq: Every day | ORAL | Status: DC | PRN

## 2020-09-24 MED ORDER — DOCUSATE SODIUM 100 MG PO CAPS: 100.0000 mg | ORAL_CAPSULE | Freq: Two times a day (BID) | ORAL | Status: DC | PRN

## 2020-09-24 NOTE — Progress Notes (Signed)
[facility] Cardiology [facility] Encounter Note  Patient: Carrie [name] / Admit Date: 09/20/2020 / Date of Encounter: 09/24/2020, 8:51 AM   Subjective: 3/19.  Patient has done very well overnight with no evidence of significant syncope dizziness nausea diaphoresis or other cardiac symptoms.  Telemetry has shown sinus rhythm at 60 bpm.  Previous telemetry yesterday has shown symptomatic bradycardia with junctional escape at 30 beats to 40 bpm.  Patient has had better hydration and presented with dehydration as her more main complaint.  Currently that is improving at this time.  Troponin elevation most consistent with demand ischemia rather than acute coronary syndrome  3/20.  Patient has developed some hypoxia cough congestion and pulmonary edema.  Chest x-ray shows pulmonary edema with significant left chest crackles.  This is suggestive of diastolic dysfunction congestive heart failure's from severe aortic valve stenosis as well as recent rehydration from episode of dehydration.  Chronic kidney disease has improved with rehydration from GFR of 30-59.  Telemetry has shown that the patient is in normal sinus rhythm with no evidence of exacerbation of symptomatic bradycardia which may be isolated.  The patient will need further treatment of current condition before further consideration of invasive procedures.  Blood pressure has been relatively reasonable with amlodipine and cannot add beta-blocker due to recent of bradycardia  3/21.  Patient has had resolution of significant issues of pulmonary edema after rehydration from admission.  Breathing much better at this time and less short of breath.  Telemetry has shown no evidence of significant rhythm disturbances bradycardia or advanced heart block.  Late this morning patient had new onset of atrial fibrillation with rapid ventricular rate but no significant symptoms of angina shortness of breath congestive heart failure or dizziness.  Amiodarone drip was  started for further treatment of atrial fibrillation and blood pressure medications have been deferred for now.  Patient is currently tolerating current treatment and further discussion will be needed with family and herself for other treatment options of the above  3/22.  Patient feeling much better today although did not sleep well last night.  Patient has had atrial fibrillation with rapid ventricular rate and now converted back to normal sinus rhythm with amiodarone drip.  There was no evidence of symptomatic bradycardia or heart block at all.  Patient has had only sinus node dysfunction when she was dehydrated with electrolyte abnormalities with good junctional escape with no evidence of symptoms.  After long discussion with the family will defer any pacemaker placement at this time.  Patient also has had significant improvement in pulmonary edema with better exam today and less need for oxygenation.  After further discussion with the family we have decided for more conservative care in light of 87 years old with above issues.  The patient is in agreement and wishes to go home. Review of Systems: Positive for: Weakness Negative for: Vision change, hearing change, syncope, dizziness, nausea, vomiting,diarrhea, bloody stool, stomach pain, cough, congestion, diaphoresis, urinary frequency, urinary pain,skin lesions, skin rashes Others previously listed  Objective: Telemetry: Normal sinus rhythm Physical Exam: Blood pressure (!) 144/73, pulse 66, temperature 98.1 F (36.7 C), temperature source Oral, resp. rate 16, height 5\' 2"  (1.575 m), weight 48.2 kg, SpO2 100 %. Body mass index is 19.44 kg/m. General: Well developed, well nourished, in no acute distress. Head: Normocephalic, atraumatic, sclera non-icteric, no xanthomas, nares are without discharge. Neck: No apparent masses Lungs: Normal respirations with no wheezes, no rhonchi, no rales , few basilar crackles  Heart: Irregular rate and rhythm,  normal S1 ABSENT S2, 4+ aortic murmur, no rub, no gallop, PMI is normal size and placement, carotid upstroke normal with bruit, jugular venous pressure normal Abdomen: Soft, non-tender, non-distended with normoactive bowel sounds. No hepatosplenomegaly. Abdominal aorta is normal size without bruit Extremities: No edema, no clubbing, no cyanosis, no ulcers,  Peripheral: 2+ radial, 2+ femoral, 2+ dorsal pedal pulses Neuro: Alert and oriented. Moves all extremities spontaneously. Psych:  Responds to questions appropriately with a normal affect.   Intake/Output Summary (Last 24 hours) at 09/24/2020 0851 Last data filed at 09/24/2020 0300 Gross per 24 hour  Intake 853.39 ml  Output 200 ml  Net 653.39 ml    Inpatient Medications:  . brimonidine  1 drop Right Eye BID  . cholecalciferol  1,000 Units Oral Daily  . enoxaparin (LOVENOX) injection  30 mg Subcutaneous Q24H  . levothyroxine  75 mcg Oral QAC breakfast  . multivitamin-lutein  1 capsule Oral Daily  . omega-3 acid ethyl esters  1 g Oral BID  . prednisoLONE acetate  1 drop Left Eye Daily   Infusions:    Labs: Recent Labs    09/23/20 0621 09/24/20 0639  NA 131* 128*  K 3.8 3.8  CL 96* 94*  CO2 28 25  GLUCOSE 104* 116*  BUN 26* 29*  CREATININE 0.96 1.09*  CALCIUM 8.4* 8.9  MG 2.1 2.3   No results for input(s): AST, ALT, ALKPHOS, BILITOT, PROT, ALBUMIN in the last 72 hours. Recent Labs    09/23/20 0621 09/24/20 0639  WBC 12.1* 15.3*  HGB 11.8* 13.3  HCT 35.1* 39.2  MCV 88.9 88.7  PLT 172 203   No results for input(s): CKTOTAL, CKMB, TROPONINI in the last 72 hours. Invalid input(s): POCBNP No results for input(s): HGBA1C in the last 72 hours.   Weights: Filed Weights   09/20/20 1134 09/21/20 0428  Weight: 45.4 kg 48.2 kg     Radiology/Studies:  DG Chest 1 View  Result Date: 09/22/2020 CLINICAL DATA:  Increasing shortness of breath for 2 days, worsening in the last hour, history of malignancy EXAM: CHEST  1  VIEW COMPARISON:  Radiograph 09/20/2020 FINDINGS: There has been fairly rapid interval development of mixed hazy interstitial and patchy opacities throughout both lungs accompanied by inter lobular septal thickening and fissural thickening with pulmonary vascular redistribution and bilateral pleural effusions. Cardiomegaly is similar to prior with a calcified, tortuous aorta. Remaining cardiomediastinal contours are unremarkable. No acute osseous or soft tissue abnormality. Degenerative changes are present in the imaged spine and shoulders. Telemetry leads overlie the chest. IMPRESSION: Findings most compatible with CHF/volume overload with cardiomegaly, bilateral pleural effusions, and findings of pulmonary edema. More patchy opacities are favored to reflect developing alveolar edema though infection is difficult to fully exclude. Electronically Signed   By: Lovena Le M.D.   On: 09/22/2020 04:16   US Abdomen Complete  Result Date: 09/21/2020 CLINICAL DATA:  87 year old female with transaminitis. EXAM: ABDOMEN ULTRASOUND COMPLETE COMPARISON:  CT Abdomen and Pelvis 05/07/2013. FINDINGS: Gallbladder: Gallstones, individually estimated at 9 mm (image 3). Partially contracted gallbladder, wall thickness 3-4 mm. No pericholecystic fluid. No sonographic Murphy sign elicited. Common bile duct: Diameter: 3 mm, normal. Liver: Background liver echogenicity is within normal limits. There is a questionable oval or triangular mixed echogenicity lesion in the subcapsular right lobe (image 40) without hypervascularity. This measures up to 1.9 cm might be artifact from the falciform ligament, uncertain. Portal vein is patent on color Doppler imaging  with normal direction of blood flow towards the liver. IVC: No abnormality visualized. Pancreas: Visualized portion unremarkable. Spleen: Size and appearance within normal limits. Right Kidney: Length: 8.7 cm. Echogenicity within normal limits. No mass or hydronephrosis  visualized. Left Kidney: Length: 8.9 cm. No left hydronephrosis. Round partially echogenic 2 cm area at the left renal midpole (image 64) is more suggestive of benign angiomyolipoma than renal cell carcinoma (image 68). No left hydronephrosis. No other discrete left renal mass. Abdominal aorta: No aneurysm visualized. Other findings: No free fluid. IMPRESSION: 1. Cholelithiasis without strong evidence of acute cholecystitis at this time. 2. Indeterminate but probable benign roughly 2 cm lesions of the right hepatic lobe and the left kidney. Both are partially echogenic, which favors benign etiology such as hemangioma and angiomyolipoma (respectively). 3. Otherwise negative for age ultrasound appearance of the abdomen. Electronically Signed   By: Genevie Ann M.D.   On: 09/21/2020 11:54   DG Chest Portable 1 View  Result Date: 09/20/2020 CLINICAL DATA:  Weakness, bradycardia, nausea, vomiting and diarrhea. EXAM: PORTABLE CHEST 1 VIEW COMPARISON:  None. FINDINGS: Trachea is midline. Heart is enlarged. Thoracic aorta is calcified. Defibrillator screw pad overlies the upper left chest. Streaky densities in the lung bases. There may be a tiny left pleural effusion. No pneumothorax. High riding humeral heads bilaterally with degenerative changes. IMPRESSION: 1. Mild bibasilar atelectasis, right greater than left. 2. Possible tiny left pleural effusion. 3.  Aortic atherosclerosis (ICD10-I70.0). Electronically Signed   By: Lorin Picket M.D.   On: 09/20/2020 12:09     Assessment and Recommendation  87 y.o. female with known severe aortic valve stenosis and hypertension having likely acute dehydration and acute kidney injury with slow improvements with hydration and then pulmonary edema with too much hydration.  Then the patient now has atrial fibrillation rapid ventricular rate now improving with amiodarone and no evidence of acute coronary syndrome at this time.  With overall improvements of all of the above now today  after further treatment with no evidence of anginal symptoms congestive heart failure and or significant rhythm disturbances.  After discussion above the patient and the family wished to continue conservative care with ambulation today for possible discharge 1.  Discontinuation of amiodarone drip after patient has had amiodarone load and converted to normal sinus rhythm.  Will suggest this is an isolated episode and will not use long-term amiodarone use due to concerns of bradycardia 2.  Patient has had resolution of overhydration and diastolic dysfunction heart failure with pulmonary edema with 1 dose of Lasix and now improved but will follow closely for any oxygenation need and/or need for 1 dose of diuretic before further ambulation 3.  No further cardiac intervention at this time including no pacemaker placement due to discussion listed above.  Will not have any other intervention as well 4.  Begin ambulation without oxygenation today and follow for improvements.  If improved significantly will consider discharged home with follow-up in 1 week   signed, Serafina Royals M.D. FACC

## 2020-09-24 NOTE — TOC Initial Note (Signed)
Transition of Care [facility]) - Initial/Assessment Note    Patient Details  Name: Carrie [name] MRN: [id number] Date of Birth: Dec 16, 1922  Transition of Care [facility]) CM/SW Contact:    Eileen Stanford, LCSW Phone Number: 09/24/2020, 1:59 PM  Clinical Narrative:   CSW spoke with pt at bedside. No family at bedside. Pt is refusing HH stating she has a neighbor who is a physical therapist. Pt states she has her nephew who also helps her out. Pt states she also does not need any equipment.                 Expected Discharge Plan: Home/Self Care Barriers to Discharge: No Barriers Identified   Patient Goals and CMS Choice Patient states their goals for this hospitalization and ongoing recovery are:: to go home      Expected Discharge Plan and Services Expected Discharge Plan: Home/Self Care In-house Referral: NA   Post Acute Care Choice: NA Living arrangements for the past 2 months: Single Family Home Expected Discharge Date: 09/24/20                                    Prior Living Arrangements/Services Living arrangements for the past 2 months: Single Family Home Lives with:: Self Patient language and need for interpreter reviewed:: Yes Do you feel safe going back to the place where you live?: Yes      Need for Family Participation in Patient Care: Yes (Comment) Care giver support system in place?: Yes (comment)   Criminal Activity/Legal Involvement Pertinent to Current Situation/Hospitalization: No - Comment as needed  Activities of Daily Living Home Assistive Devices/Equipment: Cane (specify quad or straight),Walker (specify type) ADL Screening (condition at time of admission) Patient's cognitive ability adequate to safely complete daily activities?: Yes Is the patient deaf or have difficulty hearing?: Yes Does the patient have difficulty seeing, even when wearing glasses/contacts?: Yes Does the patient have difficulty concentrating, remembering, or making decisions?:  No Patient able to express need for assistance with ADLs?: Yes Does the patient have difficulty dressing or bathing?: Yes Independently performs ADLs?: No Communication: Independent Dressing (OT): Needs assistance Is this a change from baseline?: Pre-admission baseline Grooming: Needs assistance Is this a change from baseline?: Pre-admission baseline Feeding: Needs assistance Is this a change from baseline?: Pre-admission baseline Bathing: Needs assistance Is this a change from baseline?: Pre-admission baseline Toileting: Needs assistance Is this a change from baseline?: Pre-admission baseline In/Out Bed: Needs assistance Is this a change from baseline?: Pre-admission baseline Walks in Home: Needs assistance Is this a change from baseline?: Pre-admission baseline Does the patient have difficulty walking or climbing stairs?: Yes Weakness of Legs: Both Weakness of Arms/Hands: None  Permission Sought/Granted Permission sought to share information with : Family Supports Permission granted to share information with : Yes, Verbal Permission Granted  Share Information with NAME: Randall Hiss     Permission granted to share info w Relationship: nephew     Emotional Assessment Appearance:: Appears stated age Attitude/Demeanor/Rapport: Engaged Affect (typically observed): Accepting,Appropriate Orientation: : Oriented to Situation,Oriented to  Time,Oriented to Place,Oriented to Self Alcohol / Substance Use: Not Applicable Psych Involvement: No (comment)  Admission diagnosis:  Urinary retention [R33.9] Transaminitis [R74.01] Symptomatic bradycardia [R00.1] Aortic valve stenosis, etiology of cardiac valve disease unspecified [I35.0] Patient Active Problem List   Diagnosis Date Noted  . Symptomatic bradycardia 09/20/2020  . Cellulitis 09/24/2016  . Swelling of limb 09/24/2016  .  Pain in limb 09/24/2016  . Osteopenia 12/06/2014   PCP:  Marinda Elk, MD Pharmacy:   [facility] [phone], [place] [address] Phone: [phone] Fax: [phone]     Social Determinants of Health (SDOH) Interventions    Readmission Risk Interventions No flowsheet data found.

## 2020-09-24 NOTE — TOC Transition Note (Signed)
Transition of Care [facility]) - CM/SW Discharge Note   Patient Details  Name: Carrie [name] MRN: [id number] Date of Birth: [birth year]  Transition of Care [facility]) CM/SW Contact:  Eileen Stanford, LCSW Phone Number: 09/24/2020, 2:03 PM   Clinical Narrative:   Pt dc home today. Pt declining HH and DME. Pt states her nephew will pick her up.      Barriers to Discharge: No Barriers Identified   Patient Goals and CMS Choice Patient states their goals for this hospitalization and ongoing recovery are:: to go home      Discharge Placement                Patient to be transferred to facility by: nephew   Patient and family notified of of transfer: 09/24/20  Discharge Plan and Services In-house Referral: NA   Post Acute Care Choice: NA                               Social Determinants of Health (SDOH) Interventions     Readmission Risk Interventions No flowsheet data found.

## 2020-09-24 NOTE — Discharge Summary (Signed)
Physician Discharge Summary   Carrie [name]  female DOB: 11-08-1922  WUJ:[id number]  PCP: Marinda Elk, MD  Admit date: 09/20/2020 Discharge date: 09/24/2020  Admitted From: home Disposition:  Home Nephew updated at bedside prior to discharge.  Home Health: pt and family declined HHPT CODE STATUS: DNR    30 Day Unplanned Readmission Risk Score   Flowsheet Row ED to Hosp-Admission (Current) from 09/20/2020 in [facility]  30 Day Unplanned Readmission Risk Score (%) 8.19 Filed at 09/24/2020 1200     This score is the patient's risk of an unplanned readmission within 30 days of being discharged (0 -100%). The score is based on dignosis, age, lab data, medications, orders, and past utilization.   Low:  0-14.9   Medium: 15-21.9   High: 22-29.9   Extreme: 30 and above         Hospital Course:  For full details, please see H&P, progress notes, consult notes and ancillary notes.  Briefly,  Carrie Matusik Brysonis a 87 y.o.femalewith medical history significant forsevere aortic stenosis, has declined aortic valve replacement in the past, essential hypertension, hypothyroidism, who presented to [facility] ED at her PCPs recommendation due to symptomatic bradycardia. Patient initially presented to her PCPs office due to sudden onset of diarrhea last night associated with nausea and vomiting.   At PCPs office, there were 10 vital signs revealed heart rate in the 30s and 40s. She felt dizzy therefore was sent to the ED for further evaluation. Cardiology Dr. Tamala Ser was contacted who reviewed EKG and recommended admission for pacemaker placement. She denies having any syncopal episodes. She admits to intermittent chest discomfort chest tightness for the past 2 to 3 months.    Symptomatic bradycardia  --Dr. Nehemiah Massed saw pt.  "Defer Micra pacemaker placement at this time due to no current evidence of sinus node dysfunction complete heart block and/or symptomatic  bradycardia since admission." --hold all beta blocker  Afib w RVR, not POA --started morning of 3/21. --start amiodarone bolus + gtt, by cardiology, and pt converted and rate controlled overnight.  Pt was discharged on no rate control agents.  Severe aortic stenosis, has declined aortic valve replacement in the past. Management per cardiology. --Avoid IVF  Acute hypoxic respiratory failure 2/2 pulm edema 2/2 Acute on chronic CHF with preserved EF --developed early morning 3/20, caused by gentle IVF hydration (for dehydration) in the setting of congestive heart failure due to severe aortic stenosis.  Pt initially needed supplemental O2.  Pt received IV lasix 20 mg x1 and oral lasix 40 mg x1 with good response.  Pt was weaned down to room air prior to discharge.  Elevated troponin likely demand ischemia in the setting of severe aortic stenosis Troponin 47 from 36.  She denies any chest pain.  Acute transaminitis suspect related to her severe aortic stenosis with likely liver congestion Elevated LFTs. Abdominal US showed Cholelithiasis without strong evidence of acute cholecystitis.  Euvolemic hyponatremia Presented with serum sodium 127, which didn't improve with MIVF.  AKI, POA CKD 3B Baseline creatinine appears to be 1.0 with GFR 47 Presented with creatinine of 1.5 with GFR of 32. She is on p.o. Lasix as needed at home. --Cr improved with MIVF.  Cr 1.09 on the day of discharge.  Hyperkalemia in the setting of acute renal failure, resolved Presented with serum potassium 5.8 Treatment 1 dose of Lokelma.  Hyperglycemia, no prior reported history of diabetes --A1c 5.4.    Acuteurinary retention, ruled out  Leukocytosis WBC 10.9, afebrile.   --CXR showed "patchy opacities are favored to reflect developing alveolar edema though infection is difficult to fully exclude." --procal 0.13. --hold abx for now as no source of infection and procal low.  HTN, not  currently active --BP meds held on presentation.  Home amlodipine-benazepril held pending outpatient followup due to intermittent low BP.   Discharge Diagnoses:  Active Problems:   Symptomatic bradycardia    Discharge Instructions:  Allergies as of 09/24/2020      Reactions   Other Other (See Comments)   "eye drop, unsure of name that caused  severe burning"   Amoxicillin Rash      Medication List    TAKE these medications   amLODipine-benazepril 5-10 MG capsule Commonly known as: LOTREL Hold until outpatient followup with your doctor due to your intermittent low blood pressure. What changed:   how much to take  how to take this  when to take this  additional instructions   aspirin EC 81 MG tablet Take 81 mg by mouth.   calcium carbonate 500 MG chewable tablet Commonly known as: TUMS - dosed in mg elemental calcium Chew 1,000 mg by mouth daily.   Combigan 0.2-0.5 % ophthalmic solution Generic drug: brimonidine-timolol Place 1 drop into the right eye 2 (two) times daily.   furosemide 20 MG tablet Commonly known as: LASIX Take 20 mg by mouth daily as needed for fluid.   levothyroxine 75 MCG tablet Commonly known as: SYNTHROID Take 75 mcg by mouth daily before breakfast.   meloxicam 7.5 MG tablet Commonly known as: MOBIC Take 1 tablet (7.5 mg total) by mouth daily as needed for pain. What changed:   when to take this  reasons to take this   Omega-3 1000 MG Caps Take 2,000 mg by mouth daily.   PRESERVISION AREDS 2 PO Take 1 tablet by mouth 2 (two) times daily.   Vitamin D3 25 MCG (1000 UT) Caps Take 1 capsule by mouth daily.        Follow-up Information    Corey Skains, MD. Schedule an appointment as soon as possible for a visit in 1 week(s).   Specialty: Cardiology Contact information: [facility] [place] [phone]        Marinda Elk, MD. Schedule an appointment  as soon as possible for a visit in 1 week(s).   Specialty: Physician Assistant Contact information: [address] [facility] [address] [phone]               Allergies  Allergen Reactions  . Other Other (See Comments)    "eye drop, unsure of name that caused  severe burning"  . Amoxicillin Rash     The results of significant diagnostics from this hospitalization (including imaging, microbiology, ancillary and laboratory) are listed below for reference.   Consultations:   Procedures/Studies: DG Chest 1 View  Result Date: 09/22/2020 CLINICAL DATA:  Increasing shortness of breath for 2 days, worsening in the last hour, history of malignancy EXAM: CHEST  1 VIEW COMPARISON:  Radiograph 09/20/2020 FINDINGS: There has been fairly rapid interval development of mixed hazy interstitial and patchy opacities throughout both lungs accompanied by inter lobular septal thickening and fissural thickening with pulmonary vascular redistribution and bilateral pleural effusions. Cardiomegaly is similar to prior with a calcified, tortuous aorta. Remaining cardiomediastinal contours are unremarkable. No acute osseous or soft tissue abnormality. Degenerative changes are present in the imaged spine  and shoulders. Telemetry leads overlie the chest. IMPRESSION: Findings most compatible with CHF/volume overload with cardiomegaly, bilateral pleural effusions, and findings of pulmonary edema. More patchy opacities are favored to reflect developing alveolar edema though infection is difficult to fully exclude. Electronically Signed   By: Lovena Le M.D.   On: 09/22/2020 04:16   US Abdomen Complete  Result Date: 09/21/2020 CLINICAL DATA:  87 year old female with transaminitis. EXAM: ABDOMEN ULTRASOUND COMPLETE COMPARISON:  CT Abdomen and Pelvis 05/07/2013. FINDINGS: Gallbladder: Gallstones, individually estimated at 9 mm (image 3). Partially contracted gallbladder, wall  thickness 3-4 mm. No pericholecystic fluid. No sonographic Murphy sign elicited. Common bile duct: Diameter: 3 mm, normal. Liver: Background liver echogenicity is within normal limits. There is a questionable oval or triangular mixed echogenicity lesion in the subcapsular right lobe (image 40) without hypervascularity. This measures up to 1.9 cm might be artifact from the falciform ligament, uncertain. Portal vein is patent on color Doppler imaging with normal direction of blood flow towards the liver. IVC: No abnormality visualized. Pancreas: Visualized portion unremarkable. Spleen: Size and appearance within normal limits. Right Kidney: Length: 8.7 cm. Echogenicity within normal limits. No mass or hydronephrosis visualized. Left Kidney: Length: 8.9 cm. No left hydronephrosis. Round partially echogenic 2 cm area at the left renal midpole (image 64) is more suggestive of benign angiomyolipoma than renal cell carcinoma (image 68). No left hydronephrosis. No other discrete left renal mass. Abdominal aorta: No aneurysm visualized. Other findings: No free fluid. IMPRESSION: 1. Cholelithiasis without strong evidence of acute cholecystitis at this time. 2. Indeterminate but probable benign roughly 2 cm lesions of the right hepatic lobe and the left kidney. Both are partially echogenic, which favors benign etiology such as hemangioma and angiomyolipoma (respectively). 3. Otherwise negative for age ultrasound appearance of the abdomen. Electronically Signed   By: Genevie Ann M.D.   On: 09/21/2020 11:54   DG Chest Portable 1 View  Result Date: 09/20/2020 CLINICAL DATA:  Weakness, bradycardia, nausea, vomiting and diarrhea. EXAM: PORTABLE CHEST 1 VIEW COMPARISON:  None. FINDINGS: Trachea is midline. Heart is enlarged. Thoracic aorta is calcified. Defibrillator screw pad overlies the upper left chest. Streaky densities in the lung bases. There may be a tiny left pleural effusion. No pneumothorax. High riding humeral heads  bilaterally with degenerative changes. IMPRESSION: 1. Mild bibasilar atelectasis, right greater than left. 2. Possible tiny left pleural effusion. 3.  Aortic atherosclerosis (ICD10-I70.0). Electronically Signed   By: Lorin Picket M.D.   On: 09/20/2020 12:09      Labs: BNP (last 3 results) No results for input(s): BNP in the last 8760 hours. Basic Metabolic Panel: Recent Labs  Lab 09/20/20 1138 09/20/20 1144 09/21/20 0403 09/22/20 0408 09/23/20 0621 09/24/20 0639  NA  --  127* 127* 127* 131* 128*  K  --  5.8* 5.0 4.7 3.8 3.8  CL  --  97* 98 96* 96* 94*  CO2  --  22 20* 22 28 25   GLUCOSE  --  171* 132* 156* 104* 116*  BUN  --  43* 50* 37* 26* 29*  CREATININE  --  1.50* 1.59* 1.03* 0.96 1.09*  CALCIUM  --  9.2 8.8* 8.6* 8.4* 8.9  MG 2.6*  --   --  2.0 2.1 2.3  PHOS  --  4.9*  --   --   --   --    Liver Function Tests: Recent Labs  Lab 09/20/20 1144 09/21/20 0403  AST 199* 113*  ALT 222* 166*  ALKPHOS  94 84  BILITOT 0.9 1.0  PROT 6.6 5.9*  ALBUMIN 4.1 3.7   No results for input(s): LIPASE, AMYLASE in the last 168 hours. No results for input(s): AMMONIA in the last 168 hours. CBC: Recent Labs  Lab 09/20/20 1144 09/21/20 0403 09/22/20 0408 09/23/20 0621 09/24/20 0639  WBC 10.9* 11.9* 18.3* 12.1* 15.3*  NEUTROABS 8.1* 8.8*  --   --   --   HGB 12.2 11.6* 12.7 11.8* 13.3  HCT 37.6 33.4* 36.8 35.1* 39.2  MCV 91.5 89.5 87.6 88.9 88.7  PLT 188 148* 166 172 203   Cardiac Enzymes: No results for input(s): CKTOTAL, CKMB, CKMBINDEX, TROPONINI in the last 168 hours. BNP: Invalid input(s): POCBNP CBG: Recent Labs  Lab 09/22/20 1705 09/22/20 2049 09/23/20 0809 09/23/20 1154 09/23/20 1705  GLUCAP 113* 114* 104* 133* 101*   D-Dimer No results for input(s): DDIMER in the last 72 hours. Hgb A1c No results for input(s): HGBA1C in the last 72 hours. Lipid Profile No results for input(s): CHOL, HDL, LDLCALC, TRIG, CHOLHDL, LDLDIRECT in the last 72 hours. Thyroid  function studies No results for input(s): TSH, T4TOTAL, T3FREE, THYROIDAB in the last 72 hours.  Invalid input(s): FREET3 Anemia work up No results for input(s): VITAMINB12, FOLATE, FERRITIN, TIBC, IRON, RETICCTPCT in the last 72 hours. Urinalysis    Component Value Date/Time   COLORURINE Straw 05/06/2013 1914   APPEARANCEUR Clear 05/06/2013 1914   LABSPEC 1.005 05/06/2013 1914   PHURINE 6.0 05/06/2013 1914   GLUCOSEU Negative 05/06/2013 1914   HGBUR Negative 05/06/2013 1914   BILIRUBINUR Negative 05/06/2013 1914   KETONESUR Negative 05/06/2013 1914   PROTEINUR Negative 05/06/2013 1914   NITRITE Negative 05/06/2013 1914   LEUKOCYTESUR 1+ 05/06/2013 1914   Sepsis Labs Invalid input(s): PROCALCITONIN,  WBC,  LACTICIDVEN Microbiology Recent Results (from the past 240 hour(s))  Resp Panel by RT-PCR (Flu A&B, Covid) Nasopharyngeal Swab     Status: None   Collection Time: 09/20/20 12:00 PM   Specimen: Nasopharyngeal Swab; Nasopharyngeal(NP) swabs in vial transport medium  Result Value Ref Range Status   SARS Coronavirus 2 by RT PCR NEGATIVE NEGATIVE Final    Comment: (NOTE) SARS-CoV-2 target nucleic acids are NOT DETECTED.  The SARS-CoV-2 RNA is generally detectable in upper respiratory specimens during the acute phase of infection. The lowest concentration of SARS-CoV-2 viral copies this assay can detect is 138 copies/mL. A negative result does not preclude SARS-Cov-2 infection and should not be used as the sole basis for treatment or other patient management decisions. A negative result may occur with  improper specimen collection/handling, submission of specimen other than nasopharyngeal swab, presence of viral mutation(s) within the areas targeted by this assay, and inadequate number of viral copies(<138 copies/mL). A negative result must be combined with clinical observations, patient history, and epidemiological information. The expected result is Negative.  Fact Sheet  for Patients:  EntrepreneurPulse.com.au  Fact Sheet for Healthcare Providers:  IncredibleEmployment.be  This test is no t yet approved or cleared by the [place] FDA and  has been authorized for detection and/or diagnosis of SARS-CoV-2 by FDA under an Emergency Use Authorization (EUA). This EUA will remain  in effect (meaning this test can be used) for the duration of the COVID-19 declaration under Section 564(b)(1) of the Act, 21 U.S.C.section 360bbb-3(b)(1), unless the authorization is terminated  or revoked sooner.       Influenza A by PCR NEGATIVE NEGATIVE Final   Influenza B by PCR NEGATIVE NEGATIVE Final  Comment: (NOTE) The Xpert Xpress SARS-CoV-2/FLU/RSV plus assay is intended as an aid in the diagnosis of influenza from Nasopharyngeal swab specimens and should not be used as a sole basis for treatment. Nasal washings and aspirates are unacceptable for Xpert Xpress SARS-CoV-2/FLU/RSV testing.  Fact Sheet for Patients: EntrepreneurPulse.com.au  Fact Sheet for Healthcare Providers: IncredibleEmployment.be  This test is not yet approved or cleared by the [place] FDA and has been authorized for detection and/or diagnosis of SARS-CoV-2 by FDA under an Emergency Use Authorization (EUA). This EUA will remain in effect (meaning this test can be used) for the duration of the COVID-19 declaration under Section 564(b)(1) of the Act, 21 U.S.C. section 360bbb-3(b)(1), unless the authorization is terminated or revoked.  Performed at [facility], [address] [place], [place] [address]      Total time spend on discharging this patient, including the last patient exam, discussing the hospital stay, instructions for ongoing care as it relates to all pertinent caregivers, as well as preparing the medical discharge records, prescriptions, and/or referrals as applicable, is 50  minutes.    Enzo Bi, MD  [facility] 09/24/2020, 1:20 PM

## 2020-09-24 NOTE — Progress Notes (Signed)
Mobility Specialist - Progress Note   09/24/20 1100  Mobility  Activity Ambulated in hall  Level of Assistance Minimal assist, patient does 75% or more  Assistive Device Front wheel walker  Distance Ambulated (ft) 350 ft  Mobility Response Tolerated well  Mobility performed by Mobility specialist  $Mobility charge 1 Mobility    Pre-mobility: 65 HR, 96% SpO2 During mobility: 83 HR Post-mobility: 76 HR, 95% SpO2   Pt ambulated in hallway with RW on RA. Family at bedside. Pt c/o weakness, denies pain. No LOB noted. No c/o SOB. Max HR 83 bpm.    Kathee Delton Mobility Specialist 09/24/20, 11:42 AM

## 2020-09-24 NOTE — Progress Notes (Signed)
Patient alert and oriented, vss, no complaints of pain.  Escorted out of hospital via wheelchair by volunteers.   

## 2020-09-25 NOTE — Progress Notes (Signed)
   09/23/20 0906  Assess: MEWS Score  Temp 97.8 F (36.6 C)  BP 106/62  Pulse Rate (!) 165  Resp 18  SpO2 93 %  O2 Device Nasal Cannula  O2 Flow Rate (L/min) 2 L/min  Assess: MEWS Score  MEWS Temp 0  MEWS Systolic 0  MEWS Pulse 3  MEWS RR 0  MEWS LOC 0  MEWS Score 3  MEWS Score Color Yellow  Assess: if the MEWS score is Yellow or Red  Were vital signs taken at a resting state? Yes  Focused Assessment Change from prior assessment (see assessment flowsheet)  Early Detection of Sepsis Score *See Row Information* Low  MEWS guidelines implemented *See Row Information* Yes  Treat  MEWS Interventions Administered scheduled meds/treatments;Escalated (See documentation below)  Pain Scale 0-10  Pain Score 0  Take Vital Signs  Increase Vital Sign Frequency  Yellow: Q 2hr X 2 then Q 4hr X 2, if remains yellow, continue Q 4hrs  Escalate  MEWS: Escalate Yellow: discuss with charge nurse/RN and consider discussing with provider and RRT  Notify: Charge Nurse/RN  Name of Charge Nurse/RN Notified Janett Billow Christmas  Date Charge Nurse/RN Notified 09/23/20  Time Charge Nurse/RN Notified [id number]  Notify: Provider  Provider Name/Title Kowalski/ Lai  Date Provider Notified 09/23/20  Time Provider Notified [phone]  Notification Type Page  Notification Reason Change in status  Provider response At bedside (Dr. Erin Fulling)  Date of Provider Response 09/23/20  Time of Provider Response 0910  Notify: Rapid Response  Name of Rapid Response RN Notified Sarah - CCU charge nurse  Date Rapid Response Notified 09/23/20  Time Rapid Response Notified 0910  Document  Patient Outcome Stabilized after interventions  Inserted for [facility] Christmas RN

## 2020-09-29 ENCOUNTER — Emergency Department: Payer: Medicare Other

## 2020-09-29 ENCOUNTER — Encounter: Payer: Self-pay | Admitting: Emergency Medicine

## 2020-09-29 ENCOUNTER — Emergency Department
Admission: EM | Admit: 2020-09-29 | Discharge: 2020-09-29 | Disposition: A | Discharge status: Home / Self Care | Payer: Medicare Other | Attending: Emergency Medicine | Admitting: Emergency Medicine

## 2020-09-29 ENCOUNTER — Other Ambulatory Visit: Payer: Self-pay

## 2020-09-29 DIAGNOSIS — R531 Weakness: Secondary | ICD-10-CM | POA: Diagnosis present

## 2020-09-29 DIAGNOSIS — Z87891 Personal history of nicotine dependence: Secondary | ICD-10-CM | POA: Diagnosis not present

## 2020-09-29 DIAGNOSIS — E871 Hypo-osmolality and hyponatremia: Secondary | ICD-10-CM | POA: Diagnosis not present

## 2020-09-29 DIAGNOSIS — Z7982 Long term (current) use of aspirin: Secondary | ICD-10-CM | POA: Insufficient documentation

## 2020-09-29 DIAGNOSIS — R34 Anuria and oliguria: Secondary | ICD-10-CM | POA: Insufficient documentation

## 2020-09-29 DIAGNOSIS — Z79899 Other long term (current) drug therapy: Secondary | ICD-10-CM | POA: Diagnosis not present

## 2020-09-29 DIAGNOSIS — E039 Hypothyroidism, unspecified: Secondary | ICD-10-CM | POA: Insufficient documentation

## 2020-09-29 DIAGNOSIS — R001 Bradycardia, unspecified: Secondary | ICD-10-CM | POA: Diagnosis not present

## 2020-09-29 DIAGNOSIS — I10 Essential (primary) hypertension: Secondary | ICD-10-CM | POA: Insufficient documentation

## 2020-09-29 DIAGNOSIS — Z853 Personal history of malignant neoplasm of breast: Secondary | ICD-10-CM | POA: Diagnosis not present

## 2020-09-29 LAB — COMPREHENSIVE METABOLIC PANEL
ALT: 40 U/L (ref 0–44)
AST: 33 U/L (ref 15–41)
Albumin: 3.5 g/dL (ref 3.5–5.0)
Alkaline Phosphatase: 59 U/L (ref 38–126)
Anion gap: 7 (ref 5–15)
BUN: 18 mg/dL (ref 8–23)
CO2: 26 mmol/L (ref 22–32)
Calcium: 8.9 mg/dL (ref 8.9–10.3)
Chloride: 98 mmol/L (ref 98–111)
Creatinine, Ser: 1.03 mg/dL — ABNORMAL HIGH (ref 0.44–1.00)
GFR, Estimated: 50 mL/min — ABNORMAL LOW (ref >60)
Glucose, Bld: 95 mg/dL (ref 70–99)
Potassium: 4.2 mmol/L (ref 3.5–5.1)
Sodium: 131 mmol/L — ABNORMAL LOW (ref 135–145)
Total Bilirubin: 0.7 mg/dL (ref 0.3–1.2)
Total Protein: 6.1 g/dL — ABNORMAL LOW (ref 6.5–8.1)

## 2020-09-29 LAB — CBC WITH DIFFERENTIAL/PLATELET
Abs Immature Granulocytes: 0.03 10*3/uL (ref 0.00–0.07)
Basophils Absolute: 0 10*3/uL (ref 0.0–0.1)
Basophils Relative: 0 %
Eosinophils Absolute: 0.1 10*3/uL (ref 0.0–0.5)
Eosinophils Relative: 1 %
HCT: 34.3 % — ABNORMAL LOW (ref 36.0–46.0)
Hemoglobin: 11.5 g/dL — ABNORMAL LOW (ref 12.0–15.0)
Immature Granulocytes: 0 %
Lymphocytes Relative: 17 %
Lymphs Abs: 1.5 10*3/uL (ref 0.7–4.0)
MCH: 30.2 pg (ref 26.0–34.0)
MCHC: 33.5 g/dL (ref 30.0–36.0)
MCV: 90 fL (ref 80.0–100.0)
Monocytes Absolute: 1.4 10*3/uL — ABNORMAL HIGH (ref 0.1–1.0)
Monocytes Relative: 16 %
Neutro Abs: 5.8 10*3/uL (ref 1.7–7.7)
Neutrophils Relative %: 66 %
Platelets: 208 10*3/uL (ref 150–400)
RBC: 3.81 MIL/uL — ABNORMAL LOW (ref 3.87–5.11)
RDW: 14.3 % (ref 11.5–15.5)
WBC: 8.9 10*3/uL (ref 4.0–10.5)
nRBC: 0 % (ref 0.0–0.2)

## 2020-09-29 LAB — URINALYSIS, COMPLETE (UACMP) WITH MICROSCOPIC
Bacteria, UA: NONE SEEN
Bilirubin Urine: NEGATIVE
Glucose, UA: NEGATIVE mg/dL
Hgb urine dipstick: NEGATIVE
Ketones, ur: NEGATIVE mg/dL
Leukocytes,Ua: NEGATIVE
Nitrite: NEGATIVE
Protein, ur: NEGATIVE mg/dL
Specific Gravity, Urine: 1.003 — ABNORMAL LOW (ref 1.005–1.030)
pH: 7 (ref 5.0–8.0)

## 2020-09-29 LAB — TROPONIN I (HIGH SENSITIVITY)
Troponin I (High Sensitivity): 65 ng/L — ABNORMAL HIGH (ref <18)
Troponin I (High Sensitivity): 60 ng/L — ABNORMAL HIGH (ref <18)

## 2020-09-29 LAB — MAGNESIUM: Magnesium: 2.4 mg/dL (ref 1.7–2.4)

## 2020-09-29 NOTE — ED Notes (Signed)
Pt ambulated with minimal assistance to bathroom and back to bed.

## 2020-09-29 NOTE — ED Provider Notes (Signed)
[facility] Emergency Department Provider Note ____________________________________________   Event Date/Time   First MD Initiated Contact with Patient 09/29/20 [phone]     (approximate)  I have reviewed the triage vital signs and the nursing notes.  HISTORY  Chief Complaint No chief complaint on file.   HPI Carrie [name] is a 87 y.o. femalewho presents to the ED for evaluation of generalized weakness and decreased urination.  Chart review indicates history of HTN, hypothyroidism and recent admission from 3/18-3/22 due to symptomatic bradycardia.  She had had documentation of heart rates in the 30s and 40s with her PCPs office prior to this admission, but during admission there was no documentation or evidence of bradycardia or sinus node dysfunction.  Beta-blockers were withheld and pacer was not implanted. History of severe aortic stenosis who has previously declined AVR in the past.  Patient presents to the ED with her nephew via POV for evaluation of decreased urination and generalized weakness.  Patient lives at home alone and has multiple family members and nephews who check in on her 3 times per day every day.  They report checking her vital signs 3 times per day as well, without recent bradycardia. She is ambulatory independently.  Patient reports increasing and progressive generalized weakness since last night.  She reports the sensation of decreased urination without new incontinence, frequency, dysuria or hematuria.  She denies pain anywhere, including chest pain, flank pain, abdominal pain.  Denies headache, syncope or falls.  She reports eating breakfast this morning and tolerating this well.  She reports normal bowel movement this morning without recent diarrhea or stool changes.  Nephew, at the bedside, indicates that she is acting normally and seems okay. They discuss internally about what may be the "new normal" for the patient.   Past Medical  History:  Diagnosis Date  . Breast cancer ([place]) 2012   right  . Hypertension   . Thyroid disease     Patient Active Problem List   Diagnosis Date Noted  . Symptomatic bradycardia 09/20/2020  . Cellulitis 09/24/2016  . Swelling of limb 09/24/2016  . Pain in limb 09/24/2016  . Osteopenia 12/06/2014    Past Surgical History:  Procedure Laterality Date  . ABDOMINAL HYSTERECTOMY    . APPENDECTOMY    . MASTECTOMY Right 2012  . TONSILLECTOMY      Prior to Admission medications   Medication Sig Start Date End Date Taking? Authorizing Provider  amLODipine-benazepril (LOTREL) 5-10 MG capsule Hold until outpatient followup with your doctor due to your intermittent low blood pressure. 09/24/20   Enzo Bi, MD  aspirin EC 81 MG tablet Take 81 mg by mouth.    [provider]  calcium carbonate (TUMS - DOSED IN MG ELEMENTAL CALCIUM) 500 MG chewable tablet Chew 1,000 mg by mouth daily.     [provider]  Cholecalciferol (VITAMIN D3) 1000 UNITS CAPS Take 1 capsule by mouth daily.     [provider]  COMBIGAN 0.2-0.5 % ophthalmic solution Place 1 drop into the right eye 2 (two) times daily. 06/03/20   [provider]  furosemide (LASIX) 20 MG tablet Take 20 mg by mouth daily as needed for fluid.     [provider]  levothyroxine (SYNTHROID, LEVOTHROID) 75 MCG tablet Take 75 mcg by mouth daily before breakfast.  09/10/14   [provider]  meloxicam (MOBIC) 7.5 MG tablet Take 1 tablet (7.5 mg total) by mouth daily as needed for pain. 09/24/20  Enzo Bi, MD  Multiple Vitamins-Minerals (PRESERVISION AREDS 2 PO) Take 1 tablet by mouth 2 (two) times daily.    [provider]  Omega-3 1000 MG CAPS Take 2,000 mg by mouth daily.     [provider]    Allergies Other and Amoxicillin  Family History  Problem Relation Age of Onset  . Breast cancer Sister     Social History Social History   Tobacco Use  . Smoking status:  Former Smoker    Types: Cigarettes    Quit date: 1967    Years since quitting: 55.2  . Smokeless tobacco: Never Used  Substance Use Topics  . Alcohol use: No  . Drug use: No    Review of Systems  Constitutional: No fever/chills.  Positive generalized weakness Eyes: No visual changes. ENT: No sore throat. Cardiovascular: Denies chest pain. Respiratory: Denies shortness of breath. Gastrointestinal: No abdominal pain.  No nausea, no vomiting.  No diarrhea.  No constipation. Genitourinary: Negative for dysuria.  Positive for decreased urination Musculoskeletal: Negative for back pain. Skin: Negative for rash. Neurological: Negative for headaches, focal weakness or numbness.  ____________________________________________   PHYSICAL EXAM:  VITAL SIGNS: Vitals:   09/29/20 1140 09/29/20 1300  BP: (!) 139/50 (!) 144/70  Pulse: (!) 59 (!) 57  Resp: 17 17  Temp:  98.5 F (36.9 C)  SpO2: 97% 100%     Constitutional: Alert and oriented. Well appearing and in no acute distress.  Slightly hard of hearing, but quite pleasant and conversational in full sentences.  Participates fully in examination, sitting forward independently so I can examine her back. Eyes: Conjunctivae are normal. PERRL. EOMI. Head: Atraumatic. Nose: No congestion/rhinnorhea. Mouth/Throat: Mucous membranes are moist.  Oropharynx non-erythematous. Neck: No stridor. No cervical spine tenderness to palpation. Cardiovascular: Normal rate, regular rhythm. Grossly normal heart sounds.  Good peripheral circulation. Respiratory: Normal respiratory effort.  No retractions. Lungs CTAB. Gastrointestinal: Soft , nondistended, nontender to palpation. No CVA tenderness. Musculoskeletal: No lower extremity tenderness nor edema.  No joint effusions. No signs of acute trauma. No signs of trauma to the extremities of the back.  No deformity. Neurologic:  Normal speech and language. No gross focal neurologic deficits are  appreciated. No gait instability noted. Cranial nerves II through XII intact 5/5 strength and sensation in all 4 extremities Skin:  Skin is warm, dry and intact. No rash noted. Psychiatric: Mood and affect are normal. Speech and behavior are normal.  ____________________________________________   LABS (all labs ordered are listed, but only abnormal results are displayed)  Labs Reviewed  COMPREHENSIVE METABOLIC PANEL - Abnormal; Notable for the following components:      Result Value   Sodium 131 (*)    Creatinine, Ser 1.03 (*)    Total Protein 6.1 (*)    GFR, Estimated 50 (*)    All other components within normal limits  CBC WITH DIFFERENTIAL/PLATELET - Abnormal; Notable for the following components:   RBC 3.81 (*)    Hemoglobin 11.5 (*)    HCT 34.3 (*)    Monocytes Absolute 1.4 (*)    All other components within normal limits  URINALYSIS, COMPLETE (UACMP) WITH MICROSCOPIC - Abnormal; Notable for the following components:   Color, Urine STRAW (*)    APPearance CLEAR (*)    Specific Gravity, Urine 1.003 (*)    All other components within normal limits  TROPONIN I (HIGH SENSITIVITY) - Abnormal; Notable for the following components:   Troponin I (High Sensitivity) 65 (*)  All other components within normal limits  TROPONIN I (HIGH SENSITIVITY) - Abnormal; Notable for the following components:   Troponin I (High Sensitivity) 60 (*)    All other components within normal limits  MAGNESIUM   ____________________________________________  12 Lead EKG  Sinus rhythm, rate of 59 bpm.  Normal axis and intervals.  Stigmata of LVH is present.  Inferior and lateral T wave inversions without STEMI criteria. ____________________________________________  RADIOLOGY  ED MD interpretation: 1 view CXR reviewed by me with chronic small layering effusion and interstitial opacity without evidence of acute cardiopulmonary pathology.  Official radiology report(s): DG Chest Portable 1  View  Result Date: 09/29/2020 CLINICAL DATA:  Generalized weakness, evaluate infiltrate EXAM: PORTABLE CHEST 1 VIEW COMPARISON:  09/22/2020 FINDINGS: Cardiomegaly. No significant interval change in mild, diffuse interstitial pulmonary opacity and small layering pleural effusions. No new airspace opacity. IMPRESSION: No significant interval change in mild, diffuse interstitial pulmonary opacity and small layering pleural effusions, consistent with edema in the setting of cardiomegaly. No new airspace opacity. Electronically Signed   By: Eddie Candle M.D.   On: 09/29/2020 09:47    ____________________________________________   PROCEDURES and INTERVENTIONS  Procedure(s) performed (including Critical Care):  .1-3 Lead EKG Interpretation Performed by: Vladimir Crofts, MD Authorized by: Vladimir Crofts, MD     Interpretation: normal     ECG rate:  58   ECG rate assessment: bradycardic     Rhythm: sinus bradycardia     Ectopy: none     Conduction: normal      Medications - No data to display  ____________________________________________   MDM / ED COURSE   87 year old woman presents to the ED from home with generalized weakness without its of acute derangements and amenable to outpatient management.  Normal vitals on room air.  Exam demonstrates no evidence of acute derangements.  No neurovascular deficits, distress or any signs of trauma.  Blood work demonstrates mild and chronic hyponatremia, and is largely unremarkable.  Urine without infectious features.  CXR demonstrates no infiltrates.  CT head without acute intracranial pathology such as ICH or CVA.  UA without infectious features and her renal function is intact.  Patient observed for a few hours without any worsening of her symptoms, no severe bradycardia less than the upper 50s.  Patient was requesting discharge and I see no barriers to outpatient management.  We discussed return precautions for the ED patient stable for outpatient  management.   Clinical Course as of 09/29/20 1620  Sun Sep 29, 2020  1059 Reassessed.  Patient reports feeling well.  Sinus bradycardia on the monitor maintains with rates in the mid 50s. [DS]  1059 Chronic mild hyponatremia noted. [DS]  1749 [id number].  Patient reports she is feeling well and is requesting discharge.  Nephew at the bedside has no additional concerns and reports that she seems well.  We discussed work-up without acute derangements.  We discussed following up with her PCP, and they tell me she has an appointment already scheduled for Tuesday, 2 days from now.  We discussed return precautions for the ED. [DS]    Clinical Course User Index [DS] Vladimir Crofts, MD    ____________________________________________   FINAL CLINICAL IMPRESSION(S) / ED DIAGNOSES  Final diagnoses:  Generalized weakness     ED Discharge Orders    None       Bomani Oommen Tamala Julian   Note:  This document was prepared using Dragon voice recognition software and may include unintentional dictation errors.   Tamala Julian,  Camillia Herter, MD 09/29/20 [id number]

## 2020-09-29 NOTE — Discharge Instructions (Signed)
As we discussed, no signs of UTI or significant problems.  Continue all your typical medications and follow-up with your PCP on Tuesday as already scheduled.  If she develops any significantly were causing weakness, strokelike symptoms, recurrent heart rates in the 30s or 40s, please return to the ED.

## 2020-09-29 NOTE — ED Triage Notes (Signed)
Pt to ED via POV with c/o weakness and decreased "bathroom habits". Pt states possible UTI. Pt states she feels weak and tired at this time. Pt states she feels like she never has to be when she goes. Pt recently admitted and D/C from the hospital.

## 2021-07-29 ENCOUNTER — Inpatient Hospital Stay
Admission: EM | Admit: 2021-07-29 | Discharge: 2021-08-01 | DRG: 291 | Disposition: A | Discharge status: Home / Self Care | Payer: Medicare Other | Source: Ambulatory Visit | Attending: Internal Medicine | Admitting: Internal Medicine

## 2021-07-29 ENCOUNTER — Other Ambulatory Visit: Payer: Self-pay

## 2021-07-29 ENCOUNTER — Emergency Department: Payer: Medicare Other

## 2021-07-29 ENCOUNTER — Encounter: Payer: Self-pay | Admitting: Emergency Medicine

## 2021-07-29 DIAGNOSIS — D696 Thrombocytopenia, unspecified: Secondary | ICD-10-CM | POA: Diagnosis present

## 2021-07-29 DIAGNOSIS — Z20822 Contact with and (suspected) exposure to covid-19: Secondary | ICD-10-CM | POA: Diagnosis present

## 2021-07-29 DIAGNOSIS — R339 Retention of urine, unspecified: Secondary | ICD-10-CM | POA: Diagnosis present

## 2021-07-29 DIAGNOSIS — I513 Intracardiac thrombosis, not elsewhere classified: Secondary | ICD-10-CM | POA: Diagnosis present

## 2021-07-29 DIAGNOSIS — Z7982 Long term (current) use of aspirin: Secondary | ICD-10-CM

## 2021-07-29 DIAGNOSIS — I130 Hypertensive heart and chronic kidney disease with heart failure and stage 1 through stage 4 chronic kidney disease, or unspecified chronic kidney disease: Secondary | ICD-10-CM | POA: Diagnosis not present

## 2021-07-29 DIAGNOSIS — N183 Chronic kidney disease, stage 3 unspecified: Secondary | ICD-10-CM | POA: Diagnosis present

## 2021-07-29 DIAGNOSIS — Z79890 Hormone replacement therapy (postmenopausal): Secondary | ICD-10-CM

## 2021-07-29 DIAGNOSIS — J90 Pleural effusion, not elsewhere classified: Secondary | ICD-10-CM

## 2021-07-29 DIAGNOSIS — C642 Malignant neoplasm of left kidney, except renal pelvis: Secondary | ICD-10-CM | POA: Diagnosis present

## 2021-07-29 DIAGNOSIS — I5033 Acute on chronic diastolic (congestive) heart failure: Secondary | ICD-10-CM | POA: Diagnosis present

## 2021-07-29 DIAGNOSIS — Z66 Do not resuscitate: Secondary | ICD-10-CM | POA: Diagnosis present

## 2021-07-29 DIAGNOSIS — N2889 Other specified disorders of kidney and ureter: Secondary | ICD-10-CM

## 2021-07-29 DIAGNOSIS — Z79899 Other long term (current) drug therapy: Secondary | ICD-10-CM

## 2021-07-29 DIAGNOSIS — Z791 Long term (current) use of non-steroidal anti-inflammatories (NSAID): Secondary | ICD-10-CM

## 2021-07-29 DIAGNOSIS — D631 Anemia in chronic kidney disease: Secondary | ICD-10-CM | POA: Diagnosis present

## 2021-07-29 DIAGNOSIS — Z9011 Acquired absence of right breast and nipple: Secondary | ICD-10-CM

## 2021-07-29 DIAGNOSIS — E871 Hypo-osmolality and hyponatremia: Secondary | ICD-10-CM | POA: Diagnosis not present

## 2021-07-29 DIAGNOSIS — N281 Cyst of kidney, acquired: Secondary | ICD-10-CM

## 2021-07-29 DIAGNOSIS — I350 Nonrheumatic aortic (valve) stenosis: Secondary | ICD-10-CM | POA: Diagnosis present

## 2021-07-29 DIAGNOSIS — I160 Hypertensive urgency: Secondary | ICD-10-CM | POA: Diagnosis present

## 2021-07-29 DIAGNOSIS — Z87891 Personal history of nicotine dependence: Secondary | ICD-10-CM

## 2021-07-29 DIAGNOSIS — E039 Hypothyroidism, unspecified: Secondary | ICD-10-CM | POA: Diagnosis present

## 2021-07-29 DIAGNOSIS — Z853 Personal history of malignant neoplasm of breast: Secondary | ICD-10-CM

## 2021-07-29 DIAGNOSIS — R93429 Abnormal radiologic findings on diagnostic imaging of unspecified kidney: Secondary | ICD-10-CM

## 2021-07-29 DIAGNOSIS — R531 Weakness: Secondary | ICD-10-CM

## 2021-07-29 DIAGNOSIS — N1831 Chronic kidney disease, stage 3a: Secondary | ICD-10-CM | POA: Diagnosis present

## 2021-07-29 DIAGNOSIS — Z803 Family history of malignant neoplasm of breast: Secondary | ICD-10-CM

## 2021-07-29 DIAGNOSIS — R7401 Elevation of levels of liver transaminase levels: Secondary | ICD-10-CM

## 2021-07-29 LAB — CBC WITH DIFFERENTIAL/PLATELET
Abs Immature Granulocytes: 0.01 10*3/uL (ref 0.00–0.07)
Basophils Absolute: 0 10*3/uL (ref 0.0–0.1)
Basophils Relative: 0 %
Eosinophils Absolute: 0.1 10*3/uL (ref 0.0–0.5)
Eosinophils Relative: 1 %
HCT: 36.1 % (ref 36.0–46.0)
Hemoglobin: 12.4 g/dL (ref 12.0–15.0)
Immature Granulocytes: 0 %
Lymphocytes Relative: 24 %
Lymphs Abs: 1.8 10*3/uL (ref 0.7–4.0)
MCH: 30.5 pg (ref 26.0–34.0)
MCHC: 34.3 g/dL (ref 30.0–36.0)
MCV: 88.9 fL (ref 80.0–100.0)
Monocytes Absolute: 1 10*3/uL (ref 0.1–1.0)
Monocytes Relative: 14 %
Neutro Abs: 4.3 10*3/uL (ref 1.7–7.7)
Neutrophils Relative %: 61 %
Platelets: 141 10*3/uL — ABNORMAL LOW (ref 150–400)
RBC: 4.06 MIL/uL (ref 3.87–5.11)
RDW: 13.4 % (ref 11.5–15.5)
WBC: 7.2 10*3/uL (ref 4.0–10.5)
nRBC: 0 % (ref 0.0–0.2)

## 2021-07-29 LAB — COMPREHENSIVE METABOLIC PANEL
ALT: 309 U/L — ABNORMAL HIGH (ref 0–44)
AST: 203 U/L — ABNORMAL HIGH (ref 15–41)
Albumin: 3.6 g/dL (ref 3.5–5.0)
Alkaline Phosphatase: 116 U/L (ref 38–126)
Anion gap: 9 (ref 5–15)
BUN: 25 mg/dL — ABNORMAL HIGH (ref 8–23)
CO2: 22 mmol/L (ref 22–32)
Calcium: 8.9 mg/dL (ref 8.9–10.3)
Chloride: 96 mmol/L — ABNORMAL LOW (ref 98–111)
Creatinine, Ser: 0.99 mg/dL (ref 0.44–1.00)
GFR, Estimated: 52 mL/min — ABNORMAL LOW (ref >60)
Glucose, Bld: 105 mg/dL — ABNORMAL HIGH (ref 70–99)
Potassium: 4.2 mmol/L (ref 3.5–5.1)
Sodium: 127 mmol/L — ABNORMAL LOW (ref 135–145)
Total Bilirubin: 1 mg/dL (ref 0.3–1.2)
Total Protein: 5.9 g/dL — ABNORMAL LOW (ref 6.5–8.1)

## 2021-07-29 LAB — URINALYSIS, COMPLETE (UACMP) WITH MICROSCOPIC
Bacteria, UA: NONE SEEN
Bilirubin Urine: NEGATIVE
Glucose, UA: NEGATIVE mg/dL
Hgb urine dipstick: NEGATIVE
Ketones, ur: 15 mg/dL — AB
Leukocytes,Ua: NEGATIVE
Nitrite: NEGATIVE
Protein, ur: NEGATIVE mg/dL
Specific Gravity, Urine: <1.005 — ABNORMAL LOW (ref 1.005–1.030)
Squamous Epithelial / HPF: NONE SEEN (ref 0–5)
pH: 5.5 (ref 5.0–8.0)

## 2021-07-29 LAB — LIPASE, BLOOD: Lipase: 39 U/L (ref 11–51)

## 2021-07-29 MED ORDER — IOHEXOL 300 MG/ML  SOLN
75.0000 mL | Freq: Once | INTRAMUSCULAR | Status: AC | PRN
Start: 2021-07-29 — End: 2021-07-29
  Administered 2021-07-29: 23:00:00 75 mL via INTRAVENOUS

## 2021-07-29 MED ORDER — SODIUM CHLORIDE 0.9 % IV BOLUS
500.0000 mL | Freq: Once | INTRAVENOUS | Status: AC
  Administered 2021-07-29: 23:00:00 500 mL via INTRAVENOUS

## 2021-07-29 NOTE — ED Notes (Signed)
Patient return from CT

## 2021-07-29 NOTE — ED Provider Triage Note (Signed)
Emergency Medicine Provider Triage Evaluation Note  Carrie [name] , a 88 y.o. female  was evaluated in triage.  Pt denies complaints. She was sent to the ER for evaluation after outpatient labs show elevated liver enzymes and low sodium.   Review of Systems  Positive: N/a Negative: Pain   Physical Exam  There were no vitals taken for this visit. Gen:   Awake, no distress   Resp:  Normal effort  MSK:   Moves extremities without difficulty  Other:    Medical Decision Making  Medically screening exam initiated at 6:01 PM.  Appropriate orders placed.  KAYSLEE FUREY was informed that the remainder of the evaluation will be completed by another provider, this initial triage assessment does not replace that evaluation, and the importance of remaining in the ED until their evaluation is complete.   Victorino Dike, FNP 07/29/21 [phone]

## 2021-07-29 NOTE — ED Triage Notes (Signed)
Pt to ED via POV with c/o not being able to urinate last night, but was taking in fluid, she went to her PMD where they ordered lab work and urine. Her PMD called them and told family that her Na+125 and her liver enzymes were elevated and had her come here to be evaluated.

## 2021-07-29 NOTE — ED Notes (Signed)
MD at the bedside  

## 2021-07-29 NOTE — ED Provider Notes (Signed)
[facility] Provider Note    Event Date/Time   First MD Initiated Contact with Patient 07/29/21 2307     (approximate)   History   abnormal labs   HPI Carrie [name] is a 88 y.o. female with a past medical history of hypertension and CHF who presents after being called by her primary care physician for abnormal laboratory values.  Patient was told that her sodium was 125 and that her liver enzymes were elevated however she was not told what they were.  Patient initially presented to her primary care doctor's office this morning after an episode of urinary retention last night.  40s prior to arrival patient states that she was having increasing generalized weakness and because she has had similar symptoms after she was dehydrated previously she began drinking large amounts of free water.  Patient continues to complain of generalized weakness.  Patient denies any other complaints.  Specifically patient denies any abdominal pain, nausea/vomiting/diarrhea, early satiety     Physical Exam   Triage Vital Signs: ED Triage Vitals [07/29/21 1803]  Enc Vitals Group     BP (!) 179/68     Pulse Rate 64     Resp 18     Temp 98.6 F (37 C)     Temp Source Oral     SpO2 98 %     Weight 102 lb 1.2 oz (46.3 kg)     Height 5\' 2"  (1.575 m)     Head Circumference      Peak Flow      Pain Score 0     Pain Loc      Pain Edu?      Excl. in [place]?     Most recent vital signs: Vitals:   07/29/21 2230 07/29/21 2300  BP: (!) 147/81 (!) 147/61  Pulse: (!) 55 (!) 53  Resp: 20 (!) 23  Temp:    SpO2: 97% 95%    General: Awake, no distress.  CV:  Good peripheral perfusion.  Resp:  Normal effort.  Abd:  No distention.  Nontender to palpation Other:  Cloudy cornea over right eye   ED Results / Procedures / Treatments   Labs (all labs ordered are listed, but only abnormal results are displayed) Labs Reviewed  URINALYSIS, COMPLETE (UACMP) WITH MICROSCOPIC - Abnormal;  Notable for the following components:      Result Value   APPearance CLEAR (*)    Specific Gravity, Urine <1.005 (*)    Ketones, ur 15 (*)    All other components within normal limits  COMPREHENSIVE METABOLIC PANEL - Abnormal; Notable for the following components:   Sodium 127 (*)    Chloride 96 (*)    Glucose, Bld 105 (*)    BUN 25 (*)    Total Protein 5.9 (*)    AST 203 (*)    ALT 309 (*)    GFR, Estimated 52 (*)    All other components within normal limits  CBC WITH DIFFERENTIAL/PLATELET - Abnormal; Notable for the following components:   Platelets 141 (*)    All other components within normal limits  RESP PANEL BY RT-PCR (FLU A&B, COVID) ARPGX2  LIPASE, BLOOD    RADIOLOGY ED MD interpretation: CT abdomen and pelvis pending  Official radiology report(s): CT Abdomen Pelvis W Contrast  Result Date: 07/29/2021 CLINICAL DATA:  UTI, recurrent/complicated (Female) EXAM: CT ABDOMEN AND PELVIS WITH CONTRAST TECHNIQUE: Multidetector CT imaging of the abdomen and pelvis was performed using  the standard protocol following bolus administration of intravenous contrast. RADIATION DOSE REDUCTION: This exam was performed according to the departmental dose-optimization program which includes automated exposure control, adjustment of the mA and/or kV according to patient size and/or use of iterative reconstruction technique. CONTRAST:  32mL OMNIPAQUE IOHEXOL 300 MG/ML  SOLN COMPARISON:  05/07/2013 FINDINGS: Lower chest: Small right pleural effusion and trace left pleural effusion. Right base atelectasis. Cardiomegaly. Hepatobiliary: Small gallstones within the gallbladder. No focal hepatic abnormality. No biliary ductal dilatation. Pancreas: No focal abnormality or ductal dilatation. Spleen: No focal abnormality.  Normal size. Adrenals/Urinary Tract: Bilateral renal cysts. There is an enhanced seen mass in the midpole of the left kidney measuring 2.1 cm concerning for renal cell carcinoma. No  hydronephrosis. Adrenal glands and urinary bladder unremarkable. Stomach/Bowel: Sigmoid diverticulosis. No active diverticulitis. Mild diffuse gaseous distention of bowel, predominantly colon. No bowel obstruction. Vascular/Lymphatic: Aortic atherosclerosis. No evidence of aneurysm or adenopathy. Reproductive: Prior hysterectomy.  No adnexal masses. Other: No free fluid or free air. Musculoskeletal: No acute bony abnormality. IMPRESSION: Bilateral renal cysts. 2.1 cm enhancing lesion in the midpole of the left kidney concerning for renal cell carcinoma. No stones or hydronephrosis.  Urinary bladder unremarkable. Sigmoid diverticulosis. Cholelithiasis. Small bilateral pleural effusions, right greater than left. Aortic atherosclerosis. Electronically Signed   By: Rolm Baptise M.D.   On: 07/29/2021 23:33      PROCEDURES:  Critical Care performed: No  Procedures   MEDICATIONS ORDERED IN ED: Medications  sodium chloride 0.9 % bolus 500 mL (0 mLs Intravenous Stopped 07/29/21 2310)  iohexol (OMNIPAQUE) 300 MG/ML solution 75 mL (75 mLs Intravenous Contrast Given 07/29/21 2310)     IMPRESSION / MDM / ASSESSMENT AND PLAN / ED COURSE  I reviewed the triage vital signs and the nursing notes.                              Differential diagnosis includes, but is not limited to, symptomatic hyponatremia, renal failure, cholecystitis, pancreatitis, hepatitis, liver mass  The patient is on the cardiac monitor to evaluate for evidence of arrhythmia and/or significant heart rate changes.  Patient is a 88 year old female with the above-stated past medical history who presents after being told by her primary care physician that her sodium was 125 and her liver enzymes were elevated.  Patient denies any complaints at this time other than generalized weakness.  Patient's hyponatremia likely caused by free water intake due to the sensation of being dehydrated.  Patient will still require further work-up for her  urinary retention that occurred recently. Laboratory evaluation significant for: CMP- Sodium 127 Chloride 96 AST/ALT 203/309  CT of the abdomen and pelvis pending at the end of my shift  Care of this patient will be signed out to the oncoming physician at the end of my shift.  All pertinent patient information conveyed and all questions answered.  All further care and disposition decisions will be made by the oncoming physician.      FINAL CLINICAL IMPRESSION(S) / ED DIAGNOSES   Final diagnoses:  Hyponatremia     Rx / DC Orders   ED Discharge Orders     None        Note:  This document was prepared using Dragon voice recognition software and may include unintentional dictation errors.   Naaman Plummer, MD 07/29/21 [phone]

## 2021-07-29 NOTE — ED Notes (Signed)
CT called to notify pt ready

## 2021-07-30 ENCOUNTER — Encounter: Payer: Self-pay | Admitting: Internal Medicine

## 2021-07-30 DIAGNOSIS — E039 Hypothyroidism, unspecified: Secondary | ICD-10-CM

## 2021-07-30 DIAGNOSIS — N1831 Chronic kidney disease, stage 3a: Secondary | ICD-10-CM

## 2021-07-30 DIAGNOSIS — J90 Pleural effusion, not elsewhere classified: Secondary | ICD-10-CM

## 2021-07-30 DIAGNOSIS — E871 Hypo-osmolality and hyponatremia: Secondary | ICD-10-CM

## 2021-07-30 DIAGNOSIS — R531 Weakness: Secondary | ICD-10-CM | POA: Diagnosis not present

## 2021-07-30 DIAGNOSIS — I350 Nonrheumatic aortic (valve) stenosis: Secondary | ICD-10-CM

## 2021-07-30 DIAGNOSIS — R93429 Abnormal radiologic findings on diagnostic imaging of unspecified kidney: Secondary | ICD-10-CM

## 2021-07-30 DIAGNOSIS — R7401 Elevation of levels of liver transaminase levels: Secondary | ICD-10-CM

## 2021-07-30 LAB — BRAIN NATRIURETIC PEPTIDE: B Natriuretic Peptide: 2368 pg/mL — ABNORMAL HIGH (ref 0.0–100.0)

## 2021-07-30 LAB — BASIC METABOLIC PANEL
Anion gap: 9 (ref 5–15)
BUN: 21 mg/dL (ref 8–23)
CO2: 22 mmol/L (ref 22–32)
Calcium: 8.8 mg/dL — ABNORMAL LOW (ref 8.9–10.3)
Chloride: 101 mmol/L (ref 98–111)
Creatinine, Ser: 0.99 mg/dL (ref 0.44–1.00)
GFR, Estimated: 52 mL/min — ABNORMAL LOW (ref >60)
Glucose, Bld: 98 mg/dL (ref 70–99)
Potassium: 4.7 mmol/L (ref 3.5–5.1)
Sodium: 132 mmol/L — ABNORMAL LOW (ref 135–145)

## 2021-07-30 LAB — RESP PANEL BY RT-PCR (FLU A&B, COVID) ARPGX2
Influenza A by PCR: NEGATIVE
Influenza B by PCR: NEGATIVE
SARS Coronavirus 2 by RT PCR: NEGATIVE

## 2021-07-30 LAB — HEPATIC FUNCTION PANEL
ALT: 262 U/L — ABNORMAL HIGH (ref 0–44)
AST: 152 U/L — ABNORMAL HIGH (ref 15–41)
Albumin: 3.4 g/dL — ABNORMAL LOW (ref 3.5–5.0)
Alkaline Phosphatase: 107 U/L (ref 38–126)
Bilirubin, Direct: 0.2 mg/dL (ref 0.0–0.2)
Indirect Bilirubin: 0.6 mg/dL (ref 0.3–0.9)
Total Bilirubin: 0.8 mg/dL (ref 0.3–1.2)
Total Protein: 5.4 g/dL — ABNORMAL LOW (ref 6.5–8.1)

## 2021-07-30 LAB — CBC
HCT: 35.9 % — ABNORMAL LOW (ref 36.0–46.0)
Hemoglobin: 12.2 g/dL (ref 12.0–15.0)
MCH: 30.1 pg (ref 26.0–34.0)
MCHC: 34 g/dL (ref 30.0–36.0)
MCV: 88.6 fL (ref 80.0–100.0)
Platelets: 140 10*3/uL — ABNORMAL LOW (ref 150–400)
RBC: 4.05 MIL/uL (ref 3.87–5.11)
RDW: 13.4 % (ref 11.5–15.5)
WBC: 7.7 10*3/uL (ref 4.0–10.5)
nRBC: 0 % (ref 0.0–0.2)

## 2021-07-30 MED ORDER — ACETAMINOPHEN 650 MG RE SUPP: 650.0000 mg | Freq: Four times a day (QID) | RECTAL | Status: DC | PRN

## 2021-07-30 MED ORDER — BENAZEPRIL HCL 10 MG PO TABS
10.0000 mg | ORAL_TABLET | Freq: Every day | ORAL | Status: DC
  Administered 2021-07-30 – 2021-08-01 (×2): 10 mg via ORAL
  Filled 2021-07-30 (×3): qty 1

## 2021-07-30 MED ORDER — ACETAMINOPHEN 325 MG PO TABS: 650.0000 mg | ORAL_TABLET | Freq: Four times a day (QID) | ORAL | Status: DC | PRN

## 2021-07-30 MED ORDER — FUROSEMIDE 10 MG/ML IJ SOLN
40.0000 mg | Freq: Once | INTRAMUSCULAR | Status: AC
  Administered 2021-07-30: 17:00:00 40 mg via INTRAVENOUS
  Filled 2021-07-30: qty 4

## 2021-07-30 MED ORDER — BRIMONIDINE TARTRATE-TIMOLOL 0.2-0.5 % OP SOLN
1.0000 [drp] | Freq: Two times a day (BID) | OPHTHALMIC | Status: DC
  Filled 2021-07-30 (×2): qty 5

## 2021-07-30 MED ORDER — ONDANSETRON HCL 4 MG/2ML IJ SOLN: 4.0000 mg | Freq: Four times a day (QID) | INTRAMUSCULAR | Status: DC | PRN

## 2021-07-30 MED ORDER — ONDANSETRON HCL 4 MG PO TABS: 4.0000 mg | ORAL_TABLET | Freq: Four times a day (QID) | ORAL | Status: DC | PRN

## 2021-07-30 MED ORDER — PREDNISOLONE ACETATE 1 % OP SUSP
1.0000 [drp] | Freq: Four times a day (QID) | OPHTHALMIC | Status: DC
  Administered 2021-07-30 – 2021-08-01 (×9): 1 [drp] via OPHTHALMIC
  Filled 2021-07-30: qty 1

## 2021-07-30 MED ORDER — LEVOTHYROXINE SODIUM 50 MCG PO TABS
75.0000 ug | ORAL_TABLET | Freq: Every day | ORAL | Status: DC
  Administered 2021-07-30 – 2021-08-01 (×3): 75 ug via ORAL
  Filled 2021-07-30 (×3): qty 1

## 2021-07-30 MED ORDER — ENOXAPARIN SODIUM 30 MG/0.3ML IJ SOSY
30.0000 mg | PREFILLED_SYRINGE | INTRAMUSCULAR | Status: DC
  Administered 2021-07-30 – 2021-07-31 (×2): 30 mg via SUBCUTANEOUS
  Filled 2021-07-30 (×2): qty 0.3

## 2021-07-30 NOTE — ED Notes (Signed)
Assisted patient to the toilet.

## 2021-07-30 NOTE — Progress Notes (Signed)
Care started prior to midnight in the emergency room and patient was admitted early this morning after midnight by Dr. Judd Gaudier and I am in current agreement with her assessment and plan.  Additional changes to plan of care been made accordingly.  The patient is a 88 year old elderly Caucasian female with past medical history significant for but not limited to severe aortic stenosis was declined aortic valve replacement in the past, history of hypertension, hypothyroidism, CKD stage IIIa, recent chronic hyponatremia with a baseline creatinine 127-133, history of acute transaminitis back in March 2022 believed related to liver congestion at that time as well as other comorbidities who was sent to the ED by her PCP due to abnormal sodium levels as well as LFTs.  Patient became concerned if she had a episode of urinary retention which was then since resolved.  She states this is happened in the past.  She denies any chest pain, cough, abdominal pain, nausea, vomiting or diarrhea or dysuria.  She does endorse generalized weakness.  On arrival to the ED she was noted to have a BP of 179/68 and was mildly tachypneic and slightly bradycardic.  Her blood work showed a sodium of 127 and AST and ALT elevated into the 100s of 203 and 309 respectively.  She did have a mild thrombocytopenia.  She had a CT of the abdomen pelvis done which showed bilateral renal cyst with a 2.1 cm enhancing lesion in the midpole of the left kidney concerning for renal cell carcinoma.  She did also have small bilateral pleural effusions worse on the right compared to left and some slight cardiomegaly.  In the ED she was given 500 mL bolus and currently she is being admitted and treated for the following but not limited to:  Generalized Weakness -Suspect multifactorial, possibly related to severe aortic stenosis, possible malignancy based on abnormality seen on kidney concerning for renal cell CA, hyponatremia -Given 500 mL bolus and will  not give any more fluids  -Will consult Nutrition and PT/OT to evaluate and treat   Hyponatremia, Chronic - Baseline sodium 127-133 and now sodium has gone improved from 127 is now 132 -Continue to monitor and trend and repeat CMP in the a.m.   Elevated LFT's/Abnormal LFTs/Transaminitis, Acute on chronic -AST and ALT in the 300s compared to 2 low 100s just under a year ago; Now AST is 152 and ALT is 262 -CT abdomen with no abnormality seen on kidneys -Could possibly be due to mild hepatic congestion -Continue to monitor and trend hepatic function panel repeat CMP in a.m.   CKD (chronic kidney disease) stage 3, GFR 30-59 ml/min (HCC) -At baseline; patient's BUNs/creatinine went from 25/0.99 is now 21/0.99 -Avoid further nephrotoxic medications, contrast dyes, hypotension and renally dose medications -Repeat CMP in a.m. and she did receive a 500 mL bolus we will hold off any further fluid boluses   Intermittent urinary retention -Resolved spontaneously, suspecting age-related/pelvic floor dysfunction -Urinalysis unremarkable and showed clear appearance with 15 ketones, negative leukocytes, negative nitrites, urine specific gravity of less than 1.005, no bacteria seen and 0-5 RBCs per high-power field, no squamous epithelial cells and 0-5 WBC   Hypertensive urgency -BP 179/68 on arrival -Continue home antihypertensives and titrate.  Will avoid thiazide diuretics -Continue to monitor blood pressures per protocol and last blood pressure reading was improved to 140/70   Hypothyroidism, unspecified -Continue Levothyroxine   Severe aortic valve stenosis Bilateral pleural effusion/cardiomegaly -Patient might have mild fluid overload -BNP obtained and was elevated  to 2368.0 -Will consult Cardiology for further evaluation and recommendations   Abnormal CT scan, kidney -Enhancing lesion left kidney concerning for renal cell CA -Consulted Palliative Care for goals of care  discussion  Thrombocytopenia -Patient's platelet count went from 141 is now 140 -Continue to monitor for signs and symptoms of bleeding; currently no overt bleeding noted -Repeat CBC in a.m.  We will continue to monitor patient's clinical response to intervention and repeat blood work and imaging in the a.m. if necessary and follow-up on specialist recommendations

## 2021-07-30 NOTE — Progress Notes (Signed)
Nurse was not able to obtain an EKG on patient ,cardiology was notified by secure chat and the response from [facility] was that patient telemetry looked fine, they only needed a baseline to compare.

## 2021-07-30 NOTE — Progress Notes (Signed)
PHARMACIST - PHYSICIAN COMMUNICATION  CONCERNING:  Enoxaparin (Lovenox) for DVT Prophylaxis    RECOMMENDATION: Patient was prescribed enoxaprin 40mg  q24 hours for VTE prophylaxis.   Filed Weights   07/29/21 1803  Weight: 46.3 kg (102 lb 1.2 oz)    Body mass index is 18.67 kg/m.  Estimated Creatinine Clearance: 24.3 mL/min (by C-G formula based on SCr of 0.99 mg/dL).  Patient is candidate for enoxaparin 30mg  every 24 hours based on CrCl <5ml/min or Weight <45kg  DESCRIPTION: Pharmacy has adjusted enoxaparin dose per [facility] policy.  Patient is now receiving enoxaparin 30 mg every 24 hours   Renda Rolls, PharmD, [facility] 07/30/2021 12:15 AM

## 2021-07-30 NOTE — Consult Note (Signed)
CARDIOLOGY CONSULT NOTE               Patient ID: MAKILA COLOMBE MRN: [id number] DOB/AGE: 1923-04-20 88 y.o.  Admit date: 07/29/2021 Referring Physician Dr. Raiford Noble Primary Provider Paulita Cradle, PA-C Primary Cardiologist Nehemiah Massed Reason for Consultation AS and fluid overload  HPI: The patient is a 25yoF with a PMH significant for severe aortic stenosis (AVA 0.36cm 04/2020), hypertension, hyperlipidemia, CKD 3, hypothyroidism who presented to [facility] ED 07/29/2021 at the request of her PCP with a sodium of 125 and elevated liver enzymes. Cardiology is consulted to assist with diuresis and with her known severe aortic stenosis.   The patient has been seen biannually by her regular cardiologist Dr. Serafina Royals most recently in August 2022 where continued medical management of her aortic stenosis decided upon even with its known severity.  She is prescribed to Lasix 20 mg as needed for her peripheral edema which she states she has not taken in 2 to 3 months. The patient is living independently and is able to walk for 15 minutes 3 times a day to complete 45 minutes without difficulty or significant SOB.   The patient is accompanied by her 2 nephews who help contribute to the history.   She presented to her primary care provider last Friday and on Monday due to concerns of not being able to urinate at night.  She is able to use the restroom throughout the day normally but was very concerned because she did not use the restroom like she usually does at night.  The patient keeps track of her water intake and drinks five x 8 ounce cups per day, however on Monday she had 7 cups of water. Her weight is also reportedly increased to 104 pounds, baseline was 94 pounds. Per her nephews, the patient will get worked up and does not sleep well at night and feel very weak in the morning if anything is off about her routine. Her PCP told her to present to the ED after getting labs that showed her low  sodium and elevated LFTs.  The patient denies chest pain, shortness of breath, abdominal pain or swelling, lower extremity edema, dizziness, presyncope.  Vitals on admission are significant for a blood pressure peaking at 190/66, now improved to 140/74. HR 83 O2 sats 99% on room air.  Labs on admission are significant for a sodium of 125 by her PCP, 127 on admission and now improved to 132, creatinine 0.99, GFR 52.  BNP significantly elevated at 2368, troponins 65-60.  H&H 12.2, 35.9. AST/ALT significantly elevated at 277/376.   CT chest abdomen pelvis performed on admission revealed bilateral renal cysts with a 2.1 cm enhancing lesion of the mid pole of the left kidney concerning for renal cell carcinoma, no renal stones or hydronephrosis.  Small bilateral pleural effusions right greater than left.   Review of systems complete and found to be negative unless listed above   Past Medical History:  Diagnosis Date   Breast cancer ([place]) 2012   right   Hypertension    Thyroid disease     Past Surgical History:  Procedure Laterality Date   ABDOMINAL HYSTERECTOMY     APPENDECTOMY     MASTECTOMY Right 2012   TONSILLECTOMY      Medications Prior to Admission  Medication Sig Dispense Refill Last Dose   aspirin EC 81 MG tablet Take 81 mg by mouth.   07/28/2021   COMBIGAN 0.2-0.5 % ophthalmic solution Place 1 drop  into the right eye 2 (two) times daily.   07/29/2021   DOCUSATE SODIUM RE Take 1 tablet by mouth daily.   07/28/2021   levothyroxine (SYNTHROID, LEVOTHROID) 75 MCG tablet Take 75 mcg by mouth daily before breakfast.    07/29/2021   Omega-3 1000 MG CAPS Take 2,000 mg by mouth daily.    07/29/2021   polyethylene glycol (MIRALAX / GLYCOLAX) 17 g packet Take 17 g by mouth daily.   07/28/2021   prednisoLONE acetate (PRED FORTE) 1 % ophthalmic suspension Place 1 drop into the left eye 4 (four) times daily.   07/29/2021   amLODipine-benazepril (LOTREL) 5-10 MG capsule Hold until outpatient followup  with your doctor due to your intermittent low blood pressure. (Patient not taking: Reported on 07/30/2021)   Not Taking   calcium carbonate (TUMS - DOSED IN MG ELEMENTAL CALCIUM) 500 MG chewable tablet Chew 1,000 mg by mouth daily.    07/28/2021   Cholecalciferol (VITAMIN D3) 1000 UNITS CAPS Take 1 capsule by mouth daily.    07/28/2021   furosemide (LASIX) 20 MG tablet Take 20 mg by mouth daily as needed for fluid.    PRN   meloxicam (MOBIC) 7.5 MG tablet Take 1 tablet (7.5 mg total) by mouth daily as needed for pain. (Patient not taking: Reported on 07/30/2021)   Not Taking   Multiple Vitamins-Minerals (PRESERVISION AREDS 2 PO) Take 1 tablet by mouth 2 (two) times daily.   07/28/2021   Social History   Socioeconomic History   Marital status: Widowed    Spouse name: Not on file   Number of children: Not on file   Years of education: Not on file   Highest education level: Not on file  Occupational History   Not on file  Tobacco Use   Smoking status: Former    Types: Cigarettes    Quit date: 62    Years since quitting: 56.1   Smokeless tobacco: Never  Substance and Sexual Activity   Alcohol use: No   Drug use: No   Sexual activity: Not on file  Other Topics Concern   Not on file  Social History Narrative   Not on file   Social Determinants of Health   Financial Resource Strain: Not on file  Food Insecurity: Not on file  Transportation Needs: Not on file  Physical Activity: Not on file  Stress: Not on file  Social Connections: Not on file  Intimate Partner Violence: Not on file    Family History  Problem Relation Age of Onset   Breast cancer Sister       Review of systems complete and found to be negative unless listed above   PHYSICAL EXAM General: Pleasant elderly and thin caucasian female, in no acute distress. Sitting upright in recliner with two nephews at bedside.  HEENT:  Normocephalic and atraumatic. Neck:  No JVD.  Lungs: Normal respiratory effort on room air.  Poor air entry into bases with crackles bilaterally.  Heart: HRRR . Normal S1 and soft S2. 4/6 systolic murmur heard easily throughout with radiation to the right carotid. Radial & DP pulses 1+ bilaterally. Abdomen: nontender to palpation but tight and distended.  Msk: Normal strength and tone for age. Extremities: chronic appearing venous stasis skin changes, trace LLE edema. No clubbing, cyanosis. Neuro: Alert and oriented X 3. Psych:  Mood appropriate, affect congruent.   Labs:   Lab Results  Component Value Date   WBC 7.7 07/30/2021   HGB 12.2 07/30/2021  HCT 35.9 (L) 07/30/2021   MCV 88.6 07/30/2021   PLT 140 (L) 07/30/2021    Recent Labs  Lab 07/30/21 0728  NA 132*  K 4.7  CL 101  CO2 22  BUN 21  CREATININE 0.99  CALCIUM 8.8*  PROT 5.4*  BILITOT 0.8  ALKPHOS 107  ALT 262*  AST 152*  GLUCOSE 98   No results found for: CKTOTAL, CKMB, CKMBINDEX, TROPONINI No results found for: CHOL No results found for: HDL No results found for: LDLCALC No results found for: TRIG No results found for: CHOLHDL No results found for: LDLDIRECT    Radiology: CT Abdomen Pelvis W Contrast  Result Date: 07/29/2021 CLINICAL DATA:  UTI, recurrent/complicated (Female) EXAM: CT ABDOMEN AND PELVIS WITH CONTRAST TECHNIQUE: Multidetector CT imaging of the abdomen and pelvis was performed using the standard protocol following bolus administration of intravenous contrast. RADIATION DOSE REDUCTION: This exam was performed according to the departmental dose-optimization program which includes automated exposure control, adjustment of the mA and/or kV according to patient size and/or use of iterative reconstruction technique. CONTRAST:  52mL OMNIPAQUE IOHEXOL 300 MG/ML  SOLN COMPARISON:  05/07/2013 FINDINGS: Lower chest: Small right pleural effusion and trace left pleural effusion. Right base atelectasis. Cardiomegaly. Hepatobiliary: Small gallstones within the gallbladder. No focal hepatic abnormality.  No biliary ductal dilatation. Pancreas: No focal abnormality or ductal dilatation. Spleen: No focal abnormality.  Normal size. Adrenals/Urinary Tract: Bilateral renal cysts. There is an enhanced seen mass in the midpole of the left kidney measuring 2.1 cm concerning for renal cell carcinoma. No hydronephrosis. Adrenal glands and urinary bladder unremarkable. Stomach/Bowel: Sigmoid diverticulosis. No active diverticulitis. Mild diffuse gaseous distention of bowel, predominantly colon. No bowel obstruction. Vascular/Lymphatic: Aortic atherosclerosis. No evidence of aneurysm or adenopathy. Reproductive: Prior hysterectomy.  No adnexal masses. Other: No free fluid or free air. Musculoskeletal: No acute bony abnormality. IMPRESSION: Bilateral renal cysts. 2.1 cm enhancing lesion in the midpole of the left kidney concerning for renal cell carcinoma. No stones or hydronephrosis.  Urinary bladder unremarkable. Sigmoid diverticulosis. Cholelithiasis. Small bilateral pleural effusions, right greater than left. Aortic atherosclerosis. Electronically Signed   By: Rolm Baptise M.D.   On: 07/29/2021 23:33    ECHO 04/30/2020  NORMAL LEFT VENTRICULAR SYSTOLIC FUNCTION  WITH MODERATE LVH  NORMAL RIGHT VENTRICULAR SYSTOLIC FUNCTION  MILD VALVULAR REGURGITATION (See above)  SEVERE VALVULAR STENOSIS (See above)  DEFINITE INTRACAVITARY MASS (See above)  AVA(VTI)=.36cm^2  MILD AR, MR, TR, PR  SEVERE AS  EF >55%  MOBILE LIPOMATOUS RIGHT ATRIAL SEPTAL MASS   TELEMETRY reviewed by me: NSR 61 bpm  ASSESSMENT AND PLAN:  The patient is a 39yoF with a PMH significant for severe aortic stenosis (AVA 0.36cm 04/2020), hypertension, hyperlipidemia, CKD 3, hypothyroidism who presented to [facility] ED 07/29/2021 at the request of her PCP with a sodium of 125 and elevated liver enzymes. Cardiology is consulted to assist with diuresis and with her known severe aortic stenosis.   #Severe aortic stenosis (AVA 0.36cm 04/2020) #Elevated  BNP #chronic hyponatremia The patient reports drinking more water than usual (7 cups as opposed to 5) due to concerns of not urinating at night as per her usual schedule (but urinating normally throughout the day).  She appears clinically volume overloaded with abdominal distention and pleural effusions seen on CT scan.  Her BNP is markedly elevated to 2368, sodium uptrending 125-127-132 s/p a 530mL bolus of fluids. Baseline is between 127-133. -will give a dose of IV lasix 40 mg today. Will monitor  her sodium in the morning before giving further doses. -Discussed with patient and her nephews the physiology of severe aortic stenosis and peripheral edema as a result, she has not been taking her as needed Lasix. The patient continues to decline referral for repair of her aortic valve.  -Ordered repeat echocardiogram -recommend fluid restriction to 1.5L max per day, limiting salt intake, and strict monitoring of I&Os, daily weights.   #Transaminitis AST/ALT significantly elevated at 277/376, likely in the setting of hepatic congestion. -We will diurese patient as above  #hypertensive urgency  -peaking at 190/66 on admission, now improved to 140/74. -continue benzapril 10mg .   #CKD 3 #renal mass on CT scan -appears to be around baseline with Cr 0.99 and GFR 52 -will continue to monitor, agree with goals of care discussion  This patient's plan of care was discussed and created with Dr. Lujean Amel and he is in agreement.  Signed: Tristan Schroeder , PA-C 07/30/2021, 1:28 PM

## 2021-07-30 NOTE — H&P (Signed)
History and Physical    Carrie [name] KGY:[id number] DOB: [birth year] DOA: 07/29/2021  PCP: Marinda Elk, MD   Patient coming from: home  I have personally briefly reviewed patient's relevant medical records in [facility]  Chief Complaint: abnormal labs and weakness  HPI: Carrie [name] is a 88 y.o. female with medical history significant for Severe aortic stenosis who has declined aortic valve replacement in the past, HTN, hypothyroidism, , CKD 3A, chronic hyponatremia, history of acute transaminitis during hospitalization in March 2022 believe related to liver congestion at the time, who was sent to the ED by her PCP due to abnormal sodium and LFTs.  Patient became concerned after she had an episode of urinary retention which has since resolved.  She states this has happened in the past..  She denies cough or shortness of breath, chest pain, abdominal pain, nausea, vomiting or diarrhea or dysuria.  She endorses generalized weakness.  ED course: On arrival BP 179/68 with mild tachypnea 21-23, mild bradycardia of 53 and otherwise normal vitals  Blood work: Sodium 127, AST/ALT 203/309 CBC normal except for mild thrombocytopenia with platelets of 141 Urinalysis unremarkable Lipase 39  CT abdomen and pelvis: Bilateral renal cysts with a 2.1 cm enhancing lesion mid pole of left kidney concerning for renal cell CA Otherwise unremarkable please see complete report Small bilateral pleural effusions right greater than left and cardiomegaly  Patient given a 500 mL bolus of NS.  Hospitalist consulted for admission.   Review of Systems: As per HPI otherwise all other systems on review of systems negative.   Assessment/Plan    Generalized weakness -Suspect multifactorial, possibly related to severe aortic stenosis, possible malignancy based on abnormality seen on kidney concerning for renal cell CA, hyponatremia - Consider nutrition nurse, PT consult    Hyponatremia, chronic -  Baseline sodium 1 27-1 28    Transaminitis, chronic - AST and ALT in the 300s compared to 2 low 100s just under a year ago - CT abdomen with no abnormality seen on kidneys - Could possibly be due to mild hepatic congestion    CKD (chronic kidney disease) stage 3, GFR 30-59 ml/min (HCC) - At baseline  Intermittent urinary retention - Resolved spontaneously, suspecting age-related/pelvic floor dysfunction - Urinalysis unremarkable  Hypertensive urgency - BP 179/68 on arrival - Continue home antihypertensives and titrate.  Will avoid thiazide diuretics    Hypothyroidism, unspecified - Continue levothyroxine    Severe aortic valve stenosis   Bilateral pleural effusion/cardiomegaly - Patient might have mild fluid overload - We will get BNP - Can consider cardiology consult     Abnormal CT scan, kidney - Enhancing lesion left kidney concerning for renal cell CA - For goals of care discussion/palliative care consult   DVT prophylaxis: Lovenox  Code Status: DNR Family Communication:  none  Disposition Plan: Back to previous home environment Consults called: none  Status:At the time of admission, it appears that the appropriate admission status for this patient is INPATIENT. This is judged to be reasonable and necessary in order to provide the required intensity of service to ensure the patient's safety given the presenting symptoms, physical exam findings, and initial radiographic and laboratory data in the context of their  Comorbid conditions.   Patient requires inpatient status due to high intensity of service, high risk for further deterioration and high frequency of surveillance required.   I certify that at the point of admission it is my clinical judgment that the patient will require  inpatient hospital care spanning beyond 2 midnights     Physical Exam: Vitals:   07/29/21 2139 07/29/21 2230 07/29/21 2300 07/30/21 0000  BP: (!) 190/66 (!) 147/81 (!) 147/61 (!) 164/66   Pulse: 65 (!) 55 (!) 53 64  Resp: 18 20 (!) 23 (!) 21  Temp:      TempSrc:      SpO2: 95% 97% 95% 97%  Weight:      Height:       Constitutional: Alert, oriented x 3 . Not in any apparent distress HEENT:      Head: Normocephalic and atraumatic.         Eyes: PERLA, EOMI, Conjunctivae are normal. Sclera is non-icteric.       Mouth/Throat: Mucous membranes are moist.       Neck: Supple with no signs of meningismus. Cardiovascular: Regular rate and rhythm. 3+ murmurs, gallops, or rubs. 2+ symmetrical distal pulses are present . No JVD. No  LE edema Respiratory: Respiratory effort normal .Lungs sounds clear bilaterally. No wheezes, crackles, or rhonchi.  Gastrointestinal: Soft, non tender, non distended. Positive bowel sounds.  Genitourinary: No CVA tenderness. Musculoskeletal: Nontender with normal range of motion in all extremities. No cyanosis, or erythema of extremities. Neurologic:  Face is symmetric. Moving all extremities. No gross focal neurologic deficits . Skin: Skin is warm, dry.  No rash or ulcers Psychiatric: Mood and affect are appropriate     Past Medical History:  Diagnosis Date   Breast cancer ([place]) 2012   right   Hypertension    Thyroid disease     Past Surgical History:  Procedure Laterality Date   ABDOMINAL HYSTERECTOMY     APPENDECTOMY     MASTECTOMY Right 2012   TONSILLECTOMY       reports that she quit smoking about 56 years ago. Her smoking use included cigarettes. She has never used smokeless tobacco. She reports that she does not drink alcohol and does not use drugs.  Allergies  Allergen Reactions   Other Other (See Comments)    "eye drop, unsure of name that caused  severe burning"   Amoxicillin Rash    Family History  Problem Relation Age of Onset   Breast cancer Sister       Prior to Admission medications   Medication Sig Start Date End Date Taking? Authorizing Provider  amLODipine-benazepril (LOTREL) 5-10 MG capsule Hold until  outpatient followup with your doctor due to your intermittent low blood pressure. 09/24/20   Enzo Bi, MD  aspirin EC 81 MG tablet Take 81 mg by mouth.    [provider]  calcium carbonate (TUMS - DOSED IN MG ELEMENTAL CALCIUM) 500 MG chewable tablet Chew 1,000 mg by mouth daily.     [provider]  Cholecalciferol (VITAMIN D3) 1000 UNITS CAPS Take 1 capsule by mouth daily.     [provider]  COMBIGAN 0.2-0.5 % ophthalmic solution Place 1 drop into the right eye 2 (two) times daily. 06/03/20   [provider]  furosemide (LASIX) 20 MG tablet Take 20 mg by mouth daily as needed for fluid.     [provider]  levothyroxine (SYNTHROID, LEVOTHROID) 75 MCG tablet Take 75 mcg by mouth daily before breakfast.  09/10/14   [provider]  meloxicam (MOBIC) 7.5 MG tablet Take 1 tablet (7.5 mg total) by mouth daily as needed for pain. 09/24/20   Enzo Bi, MD  Multiple Vitamins-Minerals (PRESERVISION AREDS 2 PO) Take 1 tablet by mouth 2 (  two) times daily.    [provider]  Omega-3 1000 MG CAPS Take 2,000 mg by mouth daily.     [provider]      Labs on Admission: I have personally reviewed following labs and imaging studies  CBC: Recent Labs  Lab 07/29/21 2221  WBC 7.2  NEUTROABS 4.3  HGB 12.4  HCT 36.1  MCV 88.9  PLT 704*   Basic Metabolic Panel: Recent Labs  Lab 07/29/21 2221  NA 127*  K 4.2  CL 96*  CO2 22  GLUCOSE 105*  BUN 25*  CREATININE 0.99  CALCIUM 8.9   GFR: Estimated Creatinine Clearance: 24.3 mL/min (by C-G formula based on SCr of 0.99 mg/dL). Liver Function Tests: Recent Labs  Lab 07/29/21 2221  AST 203*  ALT 309*  ALKPHOS 116  BILITOT 1.0  PROT 5.9*  ALBUMIN 3.6   Recent Labs  Lab 07/29/21 2221  LIPASE 39   No results for input(s): AMMONIA in the last 168 hours. Coagulation Profile: No results for input(s): INR, PROTIME in the last 168 hours. Cardiac Enzymes: No results for  input(s): CKTOTAL, CKMB, CKMBINDEX, TROPONINI in the last 168 hours. BNP (last 3 results) No results for input(s): PROBNP in the last 8760 hours. HbA1C: No results for input(s): HGBA1C in the last 72 hours. CBG: No results for input(s): GLUCAP in the last 168 hours. Lipid Profile: No results for input(s): CHOL, HDL, LDLCALC, TRIG, CHOLHDL, LDLDIRECT in the last 72 hours. Thyroid Function Tests: No results for input(s): TSH, T4TOTAL, FREET4, T3FREE, THYROIDAB in the last 72 hours. Anemia Panel: No results for input(s): VITAMINB12, FOLATE, FERRITIN, TIBC, IRON, RETICCTPCT in the last 72 hours. Urine analysis:    Component Value Date/Time   COLORURINE YELLOW 07/29/2021 2138   APPEARANCEUR CLEAR (A) 07/29/2021 2138   APPEARANCEUR Clear 05/06/2013 1914   LABSPEC <1.005 (L) 07/29/2021 2138   LABSPEC 1.005 05/06/2013 1914   PHURINE 5.5 07/29/2021 2138   GLUCOSEU NEGATIVE 07/29/2021 2138   GLUCOSEU Negative 05/06/2013 1914   HGBUR NEGATIVE 07/29/2021 2138   BILIRUBINUR NEGATIVE 07/29/2021 2138   BILIRUBINUR Negative 05/06/2013 1914   KETONESUR 15 (A) 07/29/2021 2138   PROTEINUR NEGATIVE 07/29/2021 2138   NITRITE NEGATIVE 07/29/2021 2138   LEUKOCYTESUR NEGATIVE 07/29/2021 2138   LEUKOCYTESUR 1+ 05/06/2013 1914    Radiological Exams on Admission: CT Abdomen Pelvis W Contrast  Result Date: 07/29/2021 CLINICAL DATA:  UTI, recurrent/complicated (Female) EXAM: CT ABDOMEN AND PELVIS WITH CONTRAST TECHNIQUE: Multidetector CT imaging of the abdomen and pelvis was performed using the standard protocol following bolus administration of intravenous contrast. RADIATION DOSE REDUCTION: This exam was performed according to the departmental dose-optimization program which includes automated exposure control, adjustment of the mA and/or kV according to patient size and/or use of iterative reconstruction technique. CONTRAST:  62mL OMNIPAQUE IOHEXOL 300 MG/ML  SOLN COMPARISON:  05/07/2013 FINDINGS: Lower  chest: Small right pleural effusion and trace left pleural effusion. Right base atelectasis. Cardiomegaly. Hepatobiliary: Small gallstones within the gallbladder. No focal hepatic abnormality. No biliary ductal dilatation. Pancreas: No focal abnormality or ductal dilatation. Spleen: No focal abnormality.  Normal size. Adrenals/Urinary Tract: Bilateral renal cysts. There is an enhanced seen mass in the midpole of the left kidney measuring 2.1 cm concerning for renal cell carcinoma. No hydronephrosis. Adrenal glands and urinary bladder unremarkable. Stomach/Bowel: Sigmoid diverticulosis. No active diverticulitis. Mild diffuse gaseous distention of bowel, predominantly colon. No bowel obstruction. Vascular/Lymphatic: Aortic atherosclerosis. No evidence of aneurysm or adenopathy. Reproductive: Prior hysterectomy.  No adnexal masses. Other: No free fluid or free air. Musculoskeletal: No acute bony abnormality. IMPRESSION: Bilateral renal cysts. 2.1 cm enhancing lesion in the midpole of the left kidney concerning for renal cell carcinoma. No stones or hydronephrosis.  Urinary bladder unremarkable. Sigmoid diverticulosis. Cholelithiasis. Small bilateral pleural effusions, right greater than left. Aortic atherosclerosis. Electronically Signed   By: Rolm Baptise M.D.   On: 07/29/2021 23:33       Athena Masse MD [facility]   07/30/2021, 12:07 AM

## 2021-07-30 NOTE — Progress Notes (Signed)
PT Cancellation Note  Patient Details Name: Carrie [name] MRN: [id number] DOB: Dec 02, 1922   Cancelled Treatment:    Reason Eval/Treat Not Completed: PT screened, no needs identified, will sign off. Per OT, pt very mobile, no AD needs, no acute needs/follow up therapy needs identified. PT spoke with pt/family, PT to sign off.   Lieutenant Diego PT, DPT 2:16 PM,07/30/21

## 2021-07-30 NOTE — Evaluation (Signed)
Occupational Therapy Evaluation Patient Details Name: Carrie [name] MRN: [id number] DOB: [birth year] Today's Date: 07/30/2021   History of Present Illness The patient is a 88 year old elderly Caucasian female with past medical history significant for but not limited to severe aortic stenosis was declined aortic valve replacement in the past, history of hypertension, hypothyroidism, CKD stage IIIa, recent chronic hyponatremia with a baseline creatinine 127-133, history of acute transaminitis back in March 2022 believed related to liver congestion at that time as well as other comorbidities who was sent to the ED by her PCP due to abnormal sodium levels as well as LFTs.   Clinical Impression   Carrie [name] was seen for OT evaluation this date. Prior to hospital admission, pt was Independent for all I/ADLs in house, assist for stairs and transportation from nephews. Pt sponge baths and uses RW for night time toileting, reports walking 15 min 3x/day in house. Pt lives alone in [place] with 4 STE, B rails, and family available as needed. Pt currently MOD I - increased time only to don mesh underwear sit<>stand. MIN cues for ADL t/f using RW - pt pulls on RW to stand requiring MIN A to stabilize, improves to SUPERVISION with cues. Single UE support dynamic standing. Pt at baseline for ADL and mobility tasks. All questions answered, no DME needs. No skilled acute OT needs identified and no OT at follow up. Will sign off. Please re-consult if additional OT needs arise.       Recommendations for follow up therapy are one component of a multi-disciplinary discharge planning process, led by the attending physician.  Recommendations may be updated based on patient status, additional functional criteria and insurance authorization.   Follow Up Recommendations  No OT follow up    Assistance Recommended at Discharge PRN  Patient can return home with the following Help with stairs or ramp for entrance    Functional  Status Assessment  Patient has not had a recent decline in their functional status  Equipment Recommendations  None recommended by OT    Recommendations for Other Services       Precautions / Restrictions Precautions Precautions: None Restrictions Weight Bearing Restrictions: No      Mobility Bed Mobility Overal bed mobility: Modified Independent             General bed mobility comments: increased time    Transfers Overall transfer level: Needs assistance Equipment used: Rolling walker (2 wheels) Transfers: Sit to/from Stand Sit to Stand: Supervision           General transfer comment: cues for RW use      Balance Overall balance assessment: No apparent balance deficits (not formally assessed)                                         ADL either performed or assessed with clinical judgement   ADL Overall ADL's : Needs assistance/impaired                                       General ADL Comments: MOD I - increased time only to don mesh underwear sit<>stand. MIN cues for ADL t/f using RW - pt pulls on RW to stand requiring MIN A to stabilize, improves to SBA with cues. Single UE support dynamic standing.  Pertinent Vitals/Pain Pain Assessment Pain Assessment: No/denies pain     Hand Dominance     Extremity/Trunk Assessment Upper Extremity Assessment Upper Extremity Assessment: Overall WFL for tasks assessed   Lower Extremity Assessment Lower Extremity Assessment: Overall WFL for tasks assessed       Communication Communication Communication: HOH   Cognition Arousal/Alertness: Awake/alert Behavior During Therapy: WFL for tasks assessed/performed Overall Cognitive Status: Within Functional Limits for tasks assessed                                                  Home Living Family/patient expects to be discharged to:: Private residence Living Arrangements: Alone Available Help at  Discharge: Family;Available PRN/intermittently Type of Home: Other(Comment) (condo) Home Access: Stairs to enter Entrance Stairs-Number of Steps: 4 Entrance Stairs-Rails: Left;Right Home Layout: One level     Bathroom Shower/Tub: Teacher, early years/pre: Handicapped height     Home Equipment: Conservation officer, nature (2 wheels);Toilet riser          Prior Functioning/Environment Prior Level of Function : Independent/Modified Independent             Mobility Comments: RW for toilet t/f at night. Walks ~49min 3x/day for exercise in home. Family assists for stairs if exiting home ADLs Comments: Sponge bathes, assist for groceries and transportation        OT Problem List: Decreased activity tolerance;Decreased knowledge of use of DME or AE         OT Goals(Current goals can be found in the care plan section) Acute Rehab OT Goals Patient Stated Goal: to go home OT Goal Formulation: With patient/family Time For Goal Achievement: 08/13/21 Potential to Achieve Goals: Good   AM-PAC OT "6 Clicks" Daily Activity     Outcome Measure Help from another person eating meals?: None Help from another person taking care of personal grooming?: None Help from another person toileting, which includes using toliet, bedpan, or urinal?: None Help from another person bathing (including washing, rinsing, drying)?: None Help from another person to put on and taking off regular upper body clothing?: None Help from another person to put on and taking off regular lower body clothing?: None 6 Click Score: 24   End of Session Equipment Utilized During Treatment: Rolling walker (2 wheels) Nurse Communication: Mobility status  Activity Tolerance: Patient tolerated treatment well Patient left: in chair;with call bell/phone within reach;with family/visitor present  OT Visit Diagnosis: Other abnormalities of gait and mobility (R26.89)                Time: 1345-1402 OT Time Calculation (min):  17 min Charges:  OT General Charges $OT Visit: 1 Visit OT Evaluation $OT Eval Low Complexity: 1 Low OT Treatments $Self Care/Home Management : 8-22 mins  Carrie [name], M.S. OTR/L  07/30/21, 2:19 PM  ascom [phone]

## 2021-07-30 NOTE — ED Provider Notes (Signed)
CT Abdomen Pelvis W Contrast  Result Date: 07/29/2021 CLINICAL DATA:  UTI, recurrent/complicated (Female) EXAM: CT ABDOMEN AND PELVIS WITH CONTRAST TECHNIQUE: Multidetector CT imaging of the abdomen and pelvis was performed using the standard protocol following bolus administration of intravenous contrast. RADIATION DOSE REDUCTION: This exam was performed according to the departmental dose-optimization program which includes automated exposure control, adjustment of the mA and/or kV according to patient size and/or use of iterative reconstruction technique. CONTRAST:  69mL OMNIPAQUE IOHEXOL 300 MG/ML  SOLN COMPARISON:  05/07/2013 FINDINGS: Lower chest: Small right pleural effusion and trace left pleural effusion. Right base atelectasis. Cardiomegaly. Hepatobiliary: Small gallstones within the gallbladder. No focal hepatic abnormality. No biliary ductal dilatation. Pancreas: No focal abnormality or ductal dilatation. Spleen: No focal abnormality.  Normal size. Adrenals/Urinary Tract: Bilateral renal cysts. There is an enhanced seen mass in the midpole of the left kidney measuring 2.1 cm concerning for renal cell carcinoma. No hydronephrosis. Adrenal glands and urinary bladder unremarkable. Stomach/Bowel: Sigmoid diverticulosis. No active diverticulitis. Mild diffuse gaseous distention of bowel, predominantly colon. No bowel obstruction. Vascular/Lymphatic: Aortic atherosclerosis. No evidence of aneurysm or adenopathy. Reproductive: Prior hysterectomy.  No adnexal masses. Other: No free fluid or free air. Musculoskeletal: No acute bony abnormality. IMPRESSION: Bilateral renal cysts. 2.1 cm enhancing lesion in the midpole of the left kidney concerning for renal cell carcinoma. No stones or hydronephrosis.  Urinary bladder unremarkable. Sigmoid diverticulosis. Cholelithiasis. Small bilateral pleural effusions, right greater than left. Aortic atherosclerosis. Electronically Signed   By: Rolm Baptise M.D.   On:  07/29/2021 23:33      CT imaging reviewed, notation of enhancing lesion of the left kidney.  Also noted small bilateral pleural effusions    Discussed with Dr. Damita Dunnings, hospitalist service will plan to admit the patient.  Did also update the patient's family on plan for admit (they were already aware from prior conversation with DR. Cheri Fowler.)   Delman Kitten, MD 07/30/21 0001

## 2021-07-30 NOTE — Progress Notes (Signed)
SBAR reviewed no further questions at this time.

## 2021-07-31 ENCOUNTER — Observation Stay
Admit: 2021-07-31 | Discharge: 2021-07-31 | Disposition: A | Discharge status: Home / Self Care | Payer: Medicare Other | Attending: Cardiology | Admitting: Cardiology

## 2021-07-31 DIAGNOSIS — J90 Pleural effusion, not elsewhere classified: Secondary | ICD-10-CM | POA: Diagnosis not present

## 2021-07-31 DIAGNOSIS — Z7189 Other specified counseling: Secondary | ICD-10-CM

## 2021-07-31 DIAGNOSIS — E039 Hypothyroidism, unspecified: Secondary | ICD-10-CM | POA: Diagnosis present

## 2021-07-31 DIAGNOSIS — N2889 Other specified disorders of kidney and ureter: Secondary | ICD-10-CM

## 2021-07-31 DIAGNOSIS — Z79890 Hormone replacement therapy (postmenopausal): Secondary | ICD-10-CM | POA: Diagnosis not present

## 2021-07-31 DIAGNOSIS — I350 Nonrheumatic aortic (valve) stenosis: Secondary | ICD-10-CM | POA: Diagnosis present

## 2021-07-31 DIAGNOSIS — N1831 Chronic kidney disease, stage 3a: Secondary | ICD-10-CM | POA: Diagnosis present

## 2021-07-31 DIAGNOSIS — R531 Weakness: Secondary | ICD-10-CM | POA: Diagnosis not present

## 2021-07-31 DIAGNOSIS — Z66 Do not resuscitate: Secondary | ICD-10-CM

## 2021-07-31 DIAGNOSIS — Z9011 Acquired absence of right breast and nipple: Secondary | ICD-10-CM | POA: Diagnosis not present

## 2021-07-31 DIAGNOSIS — Z7982 Long term (current) use of aspirin: Secondary | ICD-10-CM | POA: Diagnosis not present

## 2021-07-31 DIAGNOSIS — I160 Hypertensive urgency: Secondary | ICD-10-CM | POA: Diagnosis present

## 2021-07-31 DIAGNOSIS — I5033 Acute on chronic diastolic (congestive) heart failure: Secondary | ICD-10-CM

## 2021-07-31 DIAGNOSIS — Z87891 Personal history of nicotine dependence: Secondary | ICD-10-CM | POA: Diagnosis not present

## 2021-07-31 DIAGNOSIS — I513 Intracardiac thrombosis, not elsewhere classified: Secondary | ICD-10-CM

## 2021-07-31 DIAGNOSIS — Z853 Personal history of malignant neoplasm of breast: Secondary | ICD-10-CM | POA: Diagnosis not present

## 2021-07-31 DIAGNOSIS — Z515 Encounter for palliative care: Secondary | ICD-10-CM

## 2021-07-31 DIAGNOSIS — D696 Thrombocytopenia, unspecified: Secondary | ICD-10-CM | POA: Diagnosis present

## 2021-07-31 DIAGNOSIS — C642 Malignant neoplasm of left kidney, except renal pelvis: Secondary | ICD-10-CM | POA: Diagnosis present

## 2021-07-31 DIAGNOSIS — R93429 Abnormal radiologic findings on diagnostic imaging of unspecified kidney: Secondary | ICD-10-CM | POA: Diagnosis not present

## 2021-07-31 DIAGNOSIS — D631 Anemia in chronic kidney disease: Secondary | ICD-10-CM | POA: Diagnosis present

## 2021-07-31 DIAGNOSIS — Z803 Family history of malignant neoplasm of breast: Secondary | ICD-10-CM | POA: Diagnosis not present

## 2021-07-31 DIAGNOSIS — Z20822 Contact with and (suspected) exposure to covid-19: Secondary | ICD-10-CM | POA: Diagnosis present

## 2021-07-31 DIAGNOSIS — R339 Retention of urine, unspecified: Secondary | ICD-10-CM | POA: Diagnosis present

## 2021-07-31 DIAGNOSIS — Z79899 Other long term (current) drug therapy: Secondary | ICD-10-CM | POA: Diagnosis not present

## 2021-07-31 DIAGNOSIS — E871 Hypo-osmolality and hyponatremia: Secondary | ICD-10-CM | POA: Diagnosis present

## 2021-07-31 DIAGNOSIS — I130 Hypertensive heart and chronic kidney disease with heart failure and stage 1 through stage 4 chronic kidney disease, or unspecified chronic kidney disease: Secondary | ICD-10-CM | POA: Diagnosis present

## 2021-07-31 DIAGNOSIS — Z791 Long term (current) use of non-steroidal anti-inflammatories (NSAID): Secondary | ICD-10-CM | POA: Diagnosis not present

## 2021-07-31 LAB — ECHOCARDIOGRAM COMPLETE
AR max vel: 0.71 cm2
AV Area VTI: 0.6 cm2
AV Area mean vel: 0.65 cm2
AV Mean grad: 49 mmHg
AV Peak grad: 89.9 mmHg
Ao pk vel: 4.74 m/s
Area-P 1/2: 4.63 cm2
Height: 62 in
MV VTI: 2.15 cm2
P 1/2 time: 382 msec
S' Lateral: 2.25 cm
Weight: 1633.17 oz

## 2021-07-31 LAB — CBC
HCT: 35.4 % — ABNORMAL LOW (ref 36.0–46.0)
Hemoglobin: 12 g/dL (ref 12.0–15.0)
MCH: 30.7 pg (ref 26.0–34.0)
MCHC: 33.9 g/dL (ref 30.0–36.0)
MCV: 90.5 fL (ref 80.0–100.0)
Platelets: 132 10*3/uL — ABNORMAL LOW (ref 150–400)
RBC: 3.91 MIL/uL (ref 3.87–5.11)
RDW: 13.8 % (ref 11.5–15.5)
WBC: 6.3 10*3/uL (ref 4.0–10.5)
nRBC: 0 % (ref 0.0–0.2)

## 2021-07-31 LAB — BASIC METABOLIC PANEL
Anion gap: 9 (ref 5–15)
BUN: 24 mg/dL — ABNORMAL HIGH (ref 8–23)
CO2: 26 mmol/L (ref 22–32)
Calcium: 8.4 mg/dL — ABNORMAL LOW (ref 8.9–10.3)
Chloride: 97 mmol/L — ABNORMAL LOW (ref 98–111)
Creatinine, Ser: 1.09 mg/dL — ABNORMAL HIGH (ref 0.44–1.00)
GFR, Estimated: 46 mL/min — ABNORMAL LOW (ref >60)
Glucose, Bld: 153 mg/dL — ABNORMAL HIGH (ref 70–99)
Potassium: 3.5 mmol/L (ref 3.5–5.1)
Sodium: 132 mmol/L — ABNORMAL LOW (ref 135–145)

## 2021-07-31 LAB — APTT: aPTT: 36 seconds (ref 24–36)

## 2021-07-31 LAB — HEPARIN LEVEL (UNFRACTIONATED): Heparin Unfractionated: 0.84 IU/mL — ABNORMAL HIGH (ref 0.30–0.70)

## 2021-07-31 LAB — HEPATIC FUNCTION PANEL
ALT: 177 U/L — ABNORMAL HIGH (ref 0–44)
AST: 70 U/L — ABNORMAL HIGH (ref 15–41)
Albumin: 3.5 g/dL (ref 3.5–5.0)
Alkaline Phosphatase: 97 U/L (ref 38–126)
Bilirubin, Direct: 0.1 mg/dL (ref 0.0–0.2)
Indirect Bilirubin: 0.6 mg/dL (ref 0.3–0.9)
Total Bilirubin: 0.7 mg/dL (ref 0.3–1.2)
Total Protein: 5.6 g/dL — ABNORMAL LOW (ref 6.5–8.1)

## 2021-07-31 LAB — PHOSPHORUS: Phosphorus: 3.7 mg/dL (ref 2.5–4.6)

## 2021-07-31 LAB — MAGNESIUM: Magnesium: 2 mg/dL (ref 1.7–2.4)

## 2021-07-31 MED ORDER — FUROSEMIDE 10 MG/ML IJ SOLN
20.0000 mg | Freq: Once | INTRAMUSCULAR | Status: AC
  Administered 2021-07-31: 20 mg via INTRAVENOUS
  Filled 2021-07-31: qty 4

## 2021-07-31 MED ORDER — HEPARIN BOLUS VIA INFUSION
3000.0000 [IU] | Freq: Once | INTRAVENOUS | Status: AC
  Administered 2021-07-31: 3000 [IU] via INTRAVENOUS
  Filled 2021-07-31: qty 3000

## 2021-07-31 MED ORDER — HEPARIN (PORCINE) 25000 UT/250ML-% IV SOLN
650.0000 [IU]/h | INTRAVENOUS | Status: DC
  Administered 2021-07-31: 750 [IU]/h via INTRAVENOUS
  Filled 2021-07-31: qty 250

## 2021-07-31 MED ORDER — BRIMONIDINE TARTRATE 0.2 % OP SOLN
1.0000 [drp] | Freq: Two times a day (BID) | OPHTHALMIC | Status: DC
  Administered 2021-07-31 – 2021-08-01 (×3): 1 [drp] via OPHTHALMIC
  Filled 2021-07-31: qty 5

## 2021-07-31 MED ORDER — TIMOLOL MALEATE 0.5 % OP SOLN
1.0000 [drp] | Freq: Two times a day (BID) | OPHTHALMIC | Status: DC
  Administered 2021-07-31 – 2021-08-01 (×3): 1 [drp] via OPHTHALMIC
  Filled 2021-07-31: qty 5

## 2021-07-31 MED ORDER — FUROSEMIDE 10 MG/ML IJ SOLN: 40.0000 mg | Freq: Once | INTRAMUSCULAR | Status: DC

## 2021-07-31 NOTE — Progress Notes (Signed)
CARDIOLOGY CONSULT NOTE               Patient ID: Carrie [name] MRN: [id number] DOB/AGE: [birth year] 88 y.o.  Admit date: 07/29/2021 Referring Physician Dr. Raiford Noble Primary Provider Paulita Cradle, PA-C Primary Cardiologist Nehemiah Massed Reason for Consultation AS and fluid overload  HPI: The patient is a 88yoF with a PMH significant for severe aortic stenosis (AVA 0.36cm 04/2020), hypertension, hyperlipidemia, CKD 3, hypothyroidism who presented to [facility] ED 07/29/2021 at the request of her PCP with a sodium of 125 and elevated liver enzymes. Cardiology is consulted to assist with diuresis and with her known severe aortic stenosis. She continues to decline AVR.   Interval history: -net negative 76mL overnight s/p IV lasix 40mg  -echo resulted this morning with a RA thrombus, heparin load started  -patient slept well last ngiht, denies chest pain, SOB, palpitations, dizziness. Some LLE edema but improving.    Review of systems complete and found to be negative unless listed above   Past Medical History:  Diagnosis Date   Breast cancer ([place]) 2012   right   Hypertension    Thyroid disease     Past Surgical History:  Procedure Laterality Date   ABDOMINAL HYSTERECTOMY     APPENDECTOMY     MASTECTOMY Right 2012   TONSILLECTOMY      Medications Prior to Admission  Medication Sig Dispense Refill Last Dose   aspirin EC 81 MG tablet Take 81 mg by mouth.   07/28/2021   COMBIGAN 0.2-0.5 % ophthalmic solution Place 1 drop into the right eye 2 (two) times daily.   07/29/2021   DOCUSATE SODIUM RE Take 1 tablet by mouth daily.   07/28/2021   levothyroxine (SYNTHROID, LEVOTHROID) 75 MCG tablet Take 75 mcg by mouth daily before breakfast.    07/29/2021   Omega-3 1000 MG CAPS Take 2,000 mg by mouth daily.    07/29/2021   polyethylene glycol (MIRALAX / GLYCOLAX) 17 g packet Take 17 g by mouth daily.   07/28/2021   prednisoLONE acetate (PRED FORTE) 1 % ophthalmic suspension Place 1 drop into  the left eye 4 (four) times daily.   07/29/2021   amLODipine-benazepril (LOTREL) 5-10 MG capsule Hold until outpatient followup with your doctor due to your intermittent low blood pressure. (Patient not taking: Reported on 07/30/2021)   Not Taking   calcium carbonate (TUMS - DOSED IN MG ELEMENTAL CALCIUM) 500 MG chewable tablet Chew 1,000 mg by mouth daily.    07/28/2021   Cholecalciferol (VITAMIN D3) 1000 UNITS CAPS Take 1 capsule by mouth daily.    07/28/2021   furosemide (LASIX) 20 MG tablet Take 20 mg by mouth daily as needed for fluid.    PRN   meloxicam (MOBIC) 7.5 MG tablet Take 1 tablet (7.5 mg total) by mouth daily as needed for pain. (Patient not taking: Reported on 07/30/2021)   Not Taking   Multiple Vitamins-Minerals (PRESERVISION AREDS 2 PO) Take 1 tablet by mouth 2 (two) times daily.   07/28/2021   Social History   Socioeconomic History   Marital status: Widowed    Spouse name: Not on file   Number of children: Not on file   Years of education: Not on file   Highest education level: Not on file  Occupational History   Not on file  Tobacco Use   Smoking status: Former    Types: Cigarettes    Quit date: 46    Years since quitting: 56.1   Smokeless tobacco: Never  Substance and Sexual Activity   Alcohol use: No   Drug use: No   Sexual activity: Not on file  Other Topics Concern   Not on file  Social History Narrative   Not on file   Social Determinants of Health   Financial Resource Strain: Not on file  Food Insecurity: Not on file  Transportation Needs: Not on file  Physical Activity: Not on file  Stress: Not on file  Social Connections: Not on file  Intimate Partner Violence: Not on file    Family History  Problem Relation Age of Onset   Breast cancer Sister       Review of systems complete and found to be negative unless listed above   PHYSICAL EXAM General: Pleasant elderly and thin caucasian female, in no acute distress. Sitting upright in recliner with  one nephew at bedside.  HEENT:  Normocephalic and atraumatic. Neck:  No JVD.  Lungs: Normal respiratory effort on room air. Poor air entry into bases with left sided crackles.  Heart: HRRR . Normal S1 and absent S2. 4/6 systolic murmur heard easily throughout with radiation to the right carotid. Radial & DP pulses 1+ bilaterally. Abdomen: soft, nontender to palpation.  Msk: Normal strength and tone for age. Extremities: chronic appearing venous stasis skin changes, trace LLE edema. No clubbing, cyanosis. Neuro: Alert and oriented X 3. Psych:  Mood appropriate, affect congruent.   Labs:   Lab Results  Component Value Date   WBC 7.7 07/30/2021   HGB 12.2 07/30/2021   HCT 35.9 (L) 07/30/2021   MCV 88.6 07/30/2021   PLT 140 (L) 07/30/2021    Recent Labs  Lab 07/30/21 0728  NA 132*  K 4.7  CL 101  CO2 22  BUN 21  CREATININE 0.99  CALCIUM 8.8*  PROT 5.4*  BILITOT 0.8  ALKPHOS 107  ALT 262*  AST 152*  GLUCOSE 98    No results found for: CKTOTAL, CKMB, CKMBINDEX, TROPONINI No results found for: CHOL No results found for: HDL No results found for: LDLCALC No results found for: TRIG No results found for: CHOLHDL No results found for: LDLDIRECT    Radiology: CT Abdomen Pelvis W Contrast  Result Date: 07/29/2021 CLINICAL DATA:  UTI, recurrent/complicated (Female) EXAM: CT ABDOMEN AND PELVIS WITH CONTRAST TECHNIQUE: Multidetector CT imaging of the abdomen and pelvis was performed using the standard protocol following bolus administration of intravenous contrast. RADIATION DOSE REDUCTION: This exam was performed according to the departmental dose-optimization program which includes automated exposure control, adjustment of the mA and/or kV according to patient size and/or use of iterative reconstruction technique. CONTRAST:  74mL OMNIPAQUE IOHEXOL 300 MG/ML  SOLN COMPARISON:  05/07/2013 FINDINGS: Lower chest: Small right pleural effusion and trace left pleural effusion. Right base  atelectasis. Cardiomegaly. Hepatobiliary: Small gallstones within the gallbladder. No focal hepatic abnormality. No biliary ductal dilatation. Pancreas: No focal abnormality or ductal dilatation. Spleen: No focal abnormality.  Normal size. Adrenals/Urinary Tract: Bilateral renal cysts. There is an enhanced seen mass in the midpole of the left kidney measuring 2.1 cm concerning for renal cell carcinoma. No hydronephrosis. Adrenal glands and urinary bladder unremarkable. Stomach/Bowel: Sigmoid diverticulosis. No active diverticulitis. Mild diffuse gaseous distention of bowel, predominantly colon. No bowel obstruction. Vascular/Lymphatic: Aortic atherosclerosis. No evidence of aneurysm or adenopathy. Reproductive: Prior hysterectomy.  No adnexal masses. Other: No free fluid or free air. Musculoskeletal: No acute bony abnormality. IMPRESSION: Bilateral renal cysts. 2.1 cm enhancing lesion in the midpole of the left kidney concerning  for renal cell carcinoma. No stones or hydronephrosis.  Urinary bladder unremarkable. Sigmoid diverticulosis. Cholelithiasis. Small bilateral pleural effusions, right greater than left. Aortic atherosclerosis. Electronically Signed   By: Rolm Baptise M.D.   On: 07/29/2021 23:33    ECHO 07/31/21  1. Concentric LVH . Bright appearance . Consider an infiltrative process  ie.. Amyloid.   2. Mobile mass in RA probable thrombus . consider anticougulation 3-6  months then repeat echo.TEE problbly not necessary.   3. Severe AS/mild AI.   4. Left ventricular ejection fraction, by estimation, is 65 to 70%. The  left ventricle has normal function. The left ventricle has no regional  wall motion abnormalities. There is moderate concentric left ventricular  hypertrophy. Left ventricular  diastolic parameters are consistent with Grade II diastolic dysfunction  (pseudonormalization).   5. Right ventricular systolic function is normal. The right ventricular  size is normal. Mildly increased  right ventricular wall thickness.   6. The mitral valve is grossly normal. Mild mitral valve regurgitation.   7. The aortic valve is calcified. There is moderate calcification of the  aortic valve. There is moderate thickening of the aortic valve. Aortic  valve regurgitation is mild. Severe aortic valve stenosis.   TELEMETRY reviewed by me: sinus brady 59 bpm  ASSESSMENT AND PLAN:  The patient is a 74yoF with a PMH significant for severe aortic stenosis (AVA 0.36cm 04/2020), hypertension, hyperlipidemia, CKD 3, hypothyroidism who presented to [facility] ED 07/29/2021 at the request of her PCP with a sodium of 125 and elevated liver enzymes. Cardiology is consulted to assist with diuresis and with her known severe aortic stenosis.   #Severe aortic stenosis (AVA 0.60cm 07/2021) #Elevated BNP #chronic hyponatremia #probable right atrial thrombus on echo 07/31/21 The patient reports drinking more water than usual (7 cups as opposed to 5) due to concerns of not urinating at night as per her usual schedule (but urinating normally throughout the day).  She appeared clinically volume overloaded on admission and pleural effusions seen on CT scan.  Her BNP is markedly elevated to 2368, sodium uptrending 125-127-132 s/p a 526mL bolus of fluids. Baseline is between 127-133. -s/p IV lasix 40 mg. Net negative 1.4L since admission. Sodium stable at 132. Ordered one more dose of IV lasix 20mg  today. She will need to go home on lasix 20mg  to be taken if she has a weight gain of >5 pounds in 1 day.  -probable RA thrombus noted on echo today -- ordered IV heparin per protocol for 24hours or until theraputic. She will need to be discharged on eliquis to take for 3-13months likely. Discussed warning signs of bleeding with patient and her nephew.  -recommend fluid restriction to 1.5L max per day, limiting salt intake, and strict monitoring of I&Os, daily weights.   #Transaminitis AST/ALT significantly elevated at 277/376 on  admission - downtrending today. - likely in the setting of hepatic congestion. -We will diurese patient as above  #hypertension -peaking at 190/66 on admission, now improved. 114/50 -continue benzapril 10mg .   #CKD 3 #renal mass on CT scan - baseline with Cr 0.99 and GFR 52 - 1.09 and 48 today.  -will continue to monitor, agree with goals of care discussion  This patient's plan of care was discussed and created with Dr. Lujean Amel and he is in agreement.  Signed: Tristan Schroeder , PA-C 07/31/2021, 8:03 AM

## 2021-07-31 NOTE — Consult Note (Signed)
Consultation Note Date: 07/31/2021   Patient Name: Carrie [name]  DOB: [birth year]  MRN: [id number]  Age / Sex: 88 y.o., female  PCP: Marinda Elk, MD Referring Physician: Kerney Elbe, DO  Reason for Consultation: Establishing goals of care  HPI/Patient Profile: 88 y.o. female  with past medical history of  severe aortic stenosis has declined aortic valve replacement in the past, history of hypertension, hypothyroidism, CKD stage IIIa, and recent chronic hyponatremia admitted on 07/29/2021 with hyponatremia and abnormal LFTs. Patient also complaining of urinary retention.  She had a CT of the abdomen pelvis done which showed bilateral renal cyst with a 2.1 cm enhancing lesion in the midpole of the left kidney concerning for renal cell carcinoma.  She did also have small bilateral pleural effusions worse on the right compared to left and some slight cardiomegaly.  Cardiology was consulted and recommended Diuresis and gave the patient IV Lasix. PMT consulted to discuss Pinch.  Clinical Assessment and Goals of Care: I have reviewed medical records including EPIC notes, labs and imaging, assessed the patient and then met with patient and her nephew Randall Hiss to discuss diagnosis prognosis, Bode, EOL wishes, disposition and options.  I introduced Palliative Medicine as specialized medical care for people living with serious illness. It focuses on providing relief from the symptoms and stress of a serious illness. The goal is to improve quality of life for both the patient and the family.  We discussed a brief life review of the patient. 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   As far as functional and nutritional status, Randall Hiss shares that patient is essentially independent - able to care for herself. She walks 45 minutes/day. She has a great appetite. She is cognitively intact and able to make decisions  independently. He tells me that he and patient's other nephew check on patient regularly and assist as needed.    We discussed patient's current illness and what it means in the larger context of patient's on-going co-morbidities. We discuss her aortic stenosis, hepatic congestion, hyponatremia, and renal mass. We discuss improvement in LFTs and sodium levels.   I attempted to elicit values and goals of care important to the patient.  They share that patient maintaining as much independence as possible and remaining at home is most importance to them. They also share a desire to avoid repeated hospitalizations.   We discuss her renal mass - concern of cancer. Patient expresses understanding but has no interest in pursuing further work up as she would not be interested in any treatment options. Family agrees.    Discussed with patient/family the importance of continued conversation with family and the medical providers regarding overall plan of care and treatment options, ensuring decisions are within the context of the patients values and GOCs.    Hospice and Palliative Care services outpatient were explained and offered. We will refer to palliative services outpatient - discussed when transition to hospice services would make sense.   Questions and concerns were addressed. The family was encouraged to call with questions or concerns.    Primary Decision Maker PATIENT  Joined by nephews  SUMMARY OF RECOMMENDATIONS   - no additional work up of renal mass - referral to outpatient palliative, discussed when transition to hospice would be appropriate - main goals are to maintain independence and stay at home  Code Status/Advance Care Planning: DNR  Discharge Planning: Home with Palliative Services  Primary Diagnoses: Present on Admission:  CKD (chronic kidney disease) stage 3, GFR 30-59 ml/min (HCC)  Hypothyroidism, unspecified   I have reviewed the medical record, interviewed  the patient and family, and examined the patient. The following aspects are pertinent.  Past Medical History:  Diagnosis Date   Breast cancer ([place]) 2012   right   Hypertension    Thyroid disease    Social History   Socioeconomic History   Marital status: Widowed    Spouse name: Not on file   Number of children: Not on file   Years of education: Not on file   Highest education level: Not on file  Occupational History   Not on file  Tobacco Use   Smoking status: Former    Types: Cigarettes    Quit date: 105    Years since quitting: 56.1   Smokeless tobacco: Never  Substance and Sexual Activity   Alcohol use: No   Drug use: No   Sexual activity: Not on file  Other Topics Concern   Not on file  Social History Narrative   Not on file   Social Determinants of Health   Financial Resource Strain: Not on file  Food Insecurity: Not on file  Transportation Needs: Not on file  Physical Activity: Not on file  Stress: Not on file  Social Connections: Not on file   Family History  Problem Relation Age of Onset   Breast cancer Sister    Scheduled Meds:  benazepril  10 mg Oral Daily   brimonidine  1 drop Right Eye BID   And   timolol  1 drop Right Eye BID   furosemide  20 mg Intravenous Once   levothyroxine  75 mcg Oral Q0600   prednisoLONE acetate  1 drop Left Eye QID   Continuous Infusions:  heparin 750 Units/hr (07/31/21 1337)   PRN Meds:.acetaminophen **OR** acetaminophen, ondansetron **OR** ondansetron (ZOFRAN) IV Allergies  Allergen Reactions   Other Other (See Comments)    "eye drop, unsure of name that caused  severe burning"   Amoxicillin Rash   Review of Systems  Constitutional:  Negative for activity change and appetite change.  Respiratory:  Negative for shortness of breath.   Genitourinary:  Positive for difficulty urinating.   Physical Exam Constitutional:      General: She is not in acute distress. Pulmonary:     Effort: Pulmonary effort is  normal.  Skin:    General: Skin is warm and dry.  Neurological:     Mental Status: She is alert and oriented to person, place, and time.  Psychiatric:        Mood and Affect: Mood normal.        Behavior: Behavior normal.    Vital Signs: BP (!) 114/50 (BP Location: Right Arm)    Pulse (!) 55 Comment: Reported vitals to RN Carlota   Temp 97.7 F (36.5 C)    Resp 16    Ht 5' 2"  (1.575 m)    Wt 46.3 kg    SpO2 95%    BMI 18.67 kg/m  Pain Scale: 0-10   Pain Score: 0-No pain   SpO2: SpO2: 95 % O2 Device:SpO2: 95 % O2 Flow Rate: .   IO: Intake/output summary:  Intake/Output Summary (Last 24 hours) at 07/31/2021 1341 Last data filed at 07/31/2021 1150 Gross per 24 hour  Intake --  Output 1050 ml  Net -1050 ml    LBM: Last BM Date: 07/29/21 Baseline Weight: Weight: 46.3  kg Most recent weight: Weight: 46.3 kg     Palliative Assessment/Data:PPS 60%    Juel Burrow, DNP, [facility] Palliative Medicine Team [phone] Pager: [phone]

## 2021-07-31 NOTE — Progress Notes (Signed)
[facility]) Hospital Liaison Note  Notified by [facility] manager of patient/family request for [facility] Palliative services after discharge.  [facility] hospital liaison will follow patient for discharge disposition.  Please call with any hospice or outpatient palliative care related questions.  Thank you for the opportunity to participate in this patient's care.  Nadene Rubins, RN, BSN [place] [phone]

## 2021-07-31 NOTE — Consult Note (Signed)
ANTICOAGULATION CONSULT NOTE   Pharmacy Consult for heparin Indication: VTE Treatment  Allergies  Allergen Reactions   Other Other (See Comments)    "eye drop, unsure of name that caused  severe burning"   Amoxicillin Rash    Patient Measurements: Height: 5\' 2"  (157.5 cm) Weight: 46.3 kg (102 lb 1.2 oz) IBW/kg (Calculated) : 50.1 Heparin Dosing Weight: 46.3kg  Vital Signs: Temp: 98.4 F (36.9 C) (01/26 2026) BP: 119/56 (01/26 2026) Pulse Rate: 57 (01/26 2026)  Labs: Recent Labs    07/29/21 2221 07/30/21 0728 07/31/21 0925 07/31/21 1227 07/31/21 2018  HGB 12.4 12.2 12.0  --   --   HCT 36.1 35.9* 35.4*  --   --   PLT 141* 140* 132*  --   --   APTT  --   --   --  36  --   HEPARINUNFRC  --   --   --   --  0.84*  CREATININE 0.99 0.99 1.09*  --   --      Estimated Creatinine Clearance: 22.1 mL/min (A) (by C-G formula based on SCr of 1.09 mg/dL (H)).   Medical History: Past Medical History:  Diagnosis Date   Breast cancer ([place]) 2012   right   Hypertension    Thyroid disease      Medications:  PTA: N/A  IP: Enoxparin 30mg  q24 (x2 doses), Heparin (1/26 >>>)  Allergies: No AC/APT allergies  Assessment: 89yoF with a PMH significant for severe aortic stenosis (AVA 0.36cm 04/2020), HTN, HLD, CKD 3, hypothyroidism who presented with a sodium of 125 and elevated liver enzymes. Cardiology is consulted to assist with diuresis and with her known severe aortic stenosis. Pharmacy consulted for management of heparin the setting of VTE treatment of right atrial thrombus.   Date Time aPTT/HL Rate/Comment 1/26 2018 /0.84  750 un/hr        Goal of Therapy:  Heparin level 0.3-0.7 units/ml Monitor platelets by anticoagulation protocol: Yes   Plan:  Heparin level is supratherapeutic. Will decrease infusion rate to 650 units/hr. Recheck heparin level in 8 hours. CBC daily while on heparin.   Oswald Hillock, PharmD,  07/31/2021,9:35 PM

## 2021-07-31 NOTE — Consult Note (Addendum)
ANTICOAGULATION CONSULT NOTE - Initial Consult  Pharmacy Consult for heparin Indication: VTE Treatment  Allergies  Allergen Reactions   Other Other (See Comments)    "eye drop, unsure of name that caused  severe burning"   Amoxicillin Rash    Patient Measurements: Height: 5\' 2"  (157.5 cm) Weight: 46.3 kg (102 lb 1.2 oz) IBW/kg (Calculated) : 50.1 Heparin Dosing Weight: 46.3kg  Vital Signs: Temp: 97.7 F (36.5 C) (01/26 0525) BP: 114/50 (01/26 0525) Pulse Rate: 55 (01/26 0525)  Labs: Recent Labs    07/29/21 2221 07/30/21 0728 07/31/21 0925  HGB 12.4 12.2 12.0  HCT 36.1 35.9* 35.4*  PLT 141* 140* 132*  CREATININE 0.99 0.99 1.09*    Estimated Creatinine Clearance: 22.1 mL/min (A) (by C-G formula based on SCr of 1.09 mg/dL (H)).   Medical History: Past Medical History:  Diagnosis Date   Breast cancer ([place]) 2012   right   Hypertension    Thyroid disease      Medications:  PTA: N/A  IP: Enoxparin 30mg  q24 (x2 doses), Heparin (1/26 >>>)  Allergies: No AC/APT allergies  Assessment: 89yoF with a PMH significant for severe aortic stenosis (AVA 0.36cm 04/2020), HTN, HLD, CKD 3, hypothyroidism who presented with a sodium of 125 and elevated liver enzymes. Cardiology is consulted to assist with diuresis and with her known severe aortic stenosis. Pharmacy consulted for management of heparin the setting of VTE treatment of right atrial thrombus.   Date Time aPTT/HL Rate/Comment       Baseline Labs: aPTT - pending INR - N/A Hgb - 12.0 Plts - 132   Goal of Therapy:  Heparin level 0.3-0.7 units/ml Monitor platelets by anticoagulation protocol: Yes   Plan:  Give heparin 3000 units IV bolus x1; then start heparin IV infusion at 750 units/hr Check anti-Xa level in 8 hours and daily once consecutively therapeutic. Continue to monitor H&H and platelets daily while on heparin gtt.  Darrick Penna 07/31/2021,11:30 AM

## 2021-07-31 NOTE — Progress Notes (Signed)
PROGRESS NOTE    Carrie [name]  NLZ:[id number] DOB: 22-Jul-1922 DOA: 07/29/2021 PCP: Marinda Elk, MD   Brief Narrative:  The patient is a 88 year old elderly Caucasian female with past medical history significant for but not limited to severe aortic stenosis was declined aortic valve replacement in the past, history of hypertension, hypothyroidism, CKD stage IIIa, recent chronic hyponatremia with a baseline creatinine 127-133, history of acute transaminitis back in March 2022 believed related to liver congestion at that time as well as other comorbidities who was sent to the ED by her PCP due to abnormal sodium levels as well as LFTs.  Patient became concerned if she had a episode of urinary retention which was then since resolved.  She states this is happened in the past.  She denies any chest pain, cough, abdominal pain, nausea, vomiting or diarrhea or dysuria.  She does endorse generalized weakness.  On arrival to the ED she was noted to have a BP of 179/68 and was mildly tachypneic and slightly bradycardic.  Her blood work showed a sodium of 127 and AST and ALT elevated into the 100s of 203 and 309 respectively.  She did have a mild thrombocytopenia.  She had a CT of the abdomen pelvis done which showed bilateral renal cyst with a 2.1 cm enhancing lesion in the midpole of the left kidney concerning for renal cell carcinoma.  She did also have small bilateral pleural effusions worse on the right compared to left and some slight cardiomegaly.  In the ED she was given 500 mL bolus. Cardiology was consulted and recommended Diuresis and gave the patient IV Lasix 40 mg x1 with improvement.  She had an echocardiogram that was repeated this hospitalization which showed right atrial thrombus so she was loaded with IV heparin and they are recommending continuing for another day and then transition to oral Eliquis at discharge for 3 to 6 months.  Assessment & Plan:   Principal Problem:   Generalized  weakness Active Problems:   Hyponatremia   Transaminitis   CKD (chronic kidney disease) stage 3, GFR 30-59 ml/min (HCC)   Hypothyroidism, unspecified   Severe aortic valve stenosis   Bilateral pleural effusion   Abnormal CT scan, kidney  Generalized Weakness -Suspect multifactorial, possibly related to severe aortic stenosis, possible malignancy based on abnormality seen on kidney concerning for renal cell CA, hyponatremia but now likely in the setting of her right atrial thrombus -Initially she was given 500 mL bolus and will not give any more fluids now currently getting diuresed -Will consult Nutrition and PT/OT to evaluate and treat -PT OT recommending no follow-up   Right Atrial Thrombus -Echocardiogram done and showed a mobile mass in the right atrium which was probably thrombus -Recommendation was for anticoagulation for 3 to 6 months with repeat echocardiogram and TEE it was probably not necessary -Patient was initiated on heparin drip by cardiology and will be transitioned to oral Eliquis at discharge for least 3 to 6 months   Hyponatremia, Chronic - Baseline sodium 127-133 and now sodium has gone improved from 127 is now 132 again today -Continue to monitor and trend and repeat CMP in the a.m.   Elevated LFT's/Abnormal LFTs/Transaminitis, Acute on chronic -AST and ALT in the 300s compared to 2 low 100s just under a year ago; Now AST is trending down and was 70 and ALT is 177 -CT abdomen with no abnormality seen on kidneys -IV in the setting of hepatic congestion from volume overload -Improving as  she received diuresis yesterday -Continue to monitor and trend hepatic function panel repeat CMP in a.m.   CKD (chronic kidney disease) stage 3, GFR 30-59 ml/min (HCC) -At baseline; patient's BUNs/creatinine went from 25/0.99 is now 21/0.99 yesterday and today is now back up to 24/1.09 -Avoid further nephrotoxic medications, contrast dyes, hypotension and renally dose  medications -Repeat CMP in a.m. and she did receive a 500 mL bolus we will hold off any further fluid boluses -Continue to monitor and trend renal function carefully and repeat CMP in the a.m.   Intermittent Urinary Retention -Resolved spontaneously, suspecting age-related/pelvic floor dysfunction -Urinalysis unremarkable and showed clear appearance with 15 ketones, negative leukocytes, negative nitrites, urine specific gravity of less than 1.005, no bacteria seen and 0-5 RBCs per high-power field, no squamous epithelial cells and 0-5 WBC -Continue to have the patient ambulate   Hypertensive Urgency -BP 179/68 on arrival -Continue home antihypertensives with benazepril 10 mg daily and titrate. Will avoid thiazide diuretics -Continue to monitor blood pressures per protocol and last blood pressure reading was 114/50   Hypothyroidism, unspecified -Continue Levothyroxine -Check TSH in the AM    Severe aortic valve stenosis Bilateral pleural effusion/cardiomegaly Acute on chronic grade 2 diastolic dysfunction -Patient was fluid overload and initially she was given IV bolus of 500 mL but now given diuresis with IV Lasix 40 mg x 1 yesterday and will be given another IV 20 mg x 1 today -Patient did admit to drinking more water than usual and was consuming 7 cups as opposed to 5 and has not been really compliant with her Lasix -BNP obtained and was elevated to 2368.0 -Will consult Cardiology for further evaluation and recommendations and they diuresed her with IV Lasix 40 mg x 1 yesterday -Strict I's and O's and daily weights -Repeat echocardiogram done here showed an EF of 65 to 70% with grade 2 diastolic dysfunction -Cardiology recommending that the patient be discharged home on Lasix 20 mg p.o. daily if she has a weight gain of greater than 5 pounds in 1 day -Echocardiogram continues to show severe AS and mild AI -Her legs are less swollen today and will continue monitor for signs and symptoms  of volume overload and follow-up on cardiology recommendation   Abnormal CT scan, kidney -Enhancing lesion left kidney concerning for renal cell CA -Consulted Palliative Care for goals of care discussion   Thrombocytopenia -Patient's platelet count went from 141 is now 140 and is now dropped to 132 -Continue to monitor for signs and symptoms of bleeding now that she is going to be initiated on anticoagulation; currently no overt bleeding noted -Repeat CBC in a.m.   DVT prophylaxis: Anticoagulated with Heparin gtt Code Status: DO NOT RESUSCITATE  Family Communication: Discussed with Nephew at bedside  Disposition Plan: Pending Cardiac Clearance   Status is: Observation  The patient will require care spanning > 2 midnights and should be moved to inpatient because: She has a RA Thrombus and needs to be on [facility]   Consultants:  Cardiology  Procedures:  ECHOCARDIOGRAM IMPRESSIONS     1. Concentric LVH . Bright appearance . Consider an infiltrative process  ie.. Amyloid.   2. Mobile mass in RA probable thrombus . consider anticougulation 3-6  months then repeat echo.TEE problbly not necessary.   3. Severe AS/mild AI.   4. Left ventricular ejection fraction, by estimation, is 65 to 70%. The  left ventricle has normal function. The left ventricle has no regional  wall motion abnormalities. There is  moderate concentric left ventricular  hypertrophy. Left ventricular  diastolic parameters are consistent with Grade II diastolic dysfunction  (pseudonormalization).   5. Right ventricular systolic function is normal. The right ventricular  size is normal. Mildly increased right ventricular wall thickness.   6. The mitral valve is grossly normal. Mild mitral valve regurgitation.   7. The aortic valve is calcified. There is moderate calcification of the  aortic valve. There is moderate thickening of the aortic valve. Aortic  valve regurgitation is mild. Severe aortic valve stenosis.    FINDINGS   Left Ventricle: Left ventricular ejection fraction, by estimation, is 65  to 70%. The left ventricle has normal function. The left ventricle has no  regional wall motion abnormalities. The left ventricular internal cavity  size was normal in size. There is   moderate concentric left ventricular hypertrophy. Left ventricular  diastolic parameters are consistent with Grade II diastolic dysfunction  (pseudonormalization).   Right Ventricle: The right ventricular size is normal. Mildly increased  right ventricular wall thickness. Right ventricular systolic function is  normal.   Left Atrium: Left atrial size was normal in size.   Right Atrium: Right atrial size was normal in size.   Pericardium: Trivial pericardial effusion is present.   Mitral Valve: The mitral valve is grossly normal. Mild mitral valve  regurgitation. MV peak gradient, 5.7 mmHg. The mean mitral valve gradient  is 2.0 mmHg.   Tricuspid Valve: The tricuspid valve is normal in structure. Tricuspid  valve regurgitation is mild.   Aortic Valve: The aortic valve is calcified. There is moderate  calcification of the aortic valve. There is moderate thickening of the  aortic valve. There is moderate to severe aortic valve annular  calcification. Aortic valve regurgitation is mild. Aortic   regurgitation PHT measures 382 msec. Severe aortic stenosis is present.  Aortic valve mean gradient measures 49.0 mmHg. Aortic valve peak gradient  measures 89.9 mmHg. Aortic valve area, by VTI measures 0.60 cm.   Pulmonic Valve: The pulmonic valve was normal in structure. Pulmonic valve  regurgitation is not visualized.   Aorta: The ascending aorta was not well visualized.   IAS/Shunts: No atrial level shunt detected by color flow Doppler.   Additional Comments: Concentric LVH . Bright appearance . Consider an  infiltrative process ie.. Amyloid. Mobile mass in RA probable thrombus .  consider anticougulation 3-6  months then repeat echo.TEE problbly not  necessary. Severe AS/mild AI.      LEFT VENTRICLE  PLAX 2D  LVIDd:         3.29 cm   Diastology  LVIDs:         2.25 cm   LV e' medial:    4.24 cm/s  LV PW:         1.61 cm   LV E/e' medial:  26.2  LV IVS:        1.11 cm   LV e' lateral:   8.59 cm/s  LVOT diam:     1.60 cm   LV E/e' lateral: 12.9  LV SV:         74  LV SV Index:   51  LVOT Area:     2.01 cm      RIGHT VENTRICLE  RV Basal diam:  3.40 cm   LEFT ATRIUM             Index        RIGHT ATRIUM           Index  LA diam:        3.20 cm 2.23 cm/m   RA Area:     13.50 cm  LA Vol (A2C):   34.8 ml 24.23 ml/m  RA Volume:   32.20 ml  22.42 ml/m  LA Vol (A4C):   47.1 ml 32.79 ml/m  LA Biplane Vol: 40.9 ml 28.47 ml/m   AORTIC VALVE                     PULMONIC VALVE  AV Area (Vmax):    0.71 cm      PV Vmax:       1.25 m/s  AV Area (Vmean):   0.65 cm      PV Vmean:      92.500 cm/s  AV Area (VTI):     0.60 cm      PV VTI:        0.271 m  AV Vmax:           474.00 cm/s   PV Peak grad:  6.2 mmHg  AV Vmean:          331.000 cm/s  PV Mean grad:  4.0 mmHg  AV VTI:            1.220 m  AV Peak Grad:      89.9 mmHg  AV Mean Grad:      49.0 mmHg  LVOT Vmax:         168.00 cm/s  LVOT Vmean:        107.000 cm/s  LVOT VTI:          0.367 m  LVOT/AV VTI ratio: 0.30  AI PHT:            382 msec     AORTA  Ao Root diam: 2.90 cm   MITRAL VALVE  MV Area (PHT): 4.63 cm     SHUNTS  MV Area VTI:   2.15 cm     Systemic VTI:  0.37 m  MV Peak grad:  5.7 mmHg     Systemic Diam: 1.60 cm  MV Mean grad:  2.0 mmHg  MV Vmax:       1.19 m/s  MV Vmean:      74.0 cm/s  MV Decel Time: 164 msec  MV E velocity: 111.00 cm/s  MV A velocity: 94.30 cm/s  MV E/A ratio:  1.18   Antimicrobials:  Anti-infectives (From admission, onward)    None        Subjective: Seen and examined at bedside and she thinks she was doing better.  States that she slept fairly well.  No nausea or vomiting.  Nephew  thinks that her legs are less swollen but patient states that this is like this in the morning.  She denies chest pain or shortness of breath.  No other concerns or complaints this time.  Objective: Vitals:   07/30/21 0827 07/30/21 1213 07/30/21 1615 07/31/21 0525  BP: (!) 147/102 140/74 (!) 112/51 (!) 114/50  Pulse: 70 83 67 (!) 55  Resp: 20 18 18 16   Temp: 97.6 F (36.4 C) 98.1 F (36.7 C) 97.7 F (36.5 C) 97.7 F (36.5 C)  TempSrc:      SpO2: 99% 96% 98% 95%  Weight:      Height:        Intake/Output Summary (Last 24 hours) at 07/31/2021 1229 Last data filed at 07/31/2021 1150 Gross per 24 hour  Intake --  Output 1050 ml  Net -  1050 ml   Filed Weights   07/29/21 1803  Weight: 46.3 kg   Examination: Physical Exam:  Constitutional: Thin elderly chronically ill-appearing Caucasian female currently in no acute distress Eyes: Has some left eye cloudiness and has glaucoma ENMT: External Ears, Nose appear normal. Grossly normal hearing. Mucous membranes are moist.   Neck: Appears normal, supple, no cervical masses, normal ROM, no appreciable thyromegaly; no appreciable JVD Respiratory: Diminished to auscultation bilaterally with coarse breath sounds, no wheezing, rales, rhonchi or crackles. Normal respiratory effort and patient is not tachypenic. No accessory muscle use.  Cardiovascular: RRR, has a prominent 4/6 systolic murmur.  Mild 1+ lower extremity edema Abdomen: Soft, non-tender, non-distended. Bowel sounds positive.  GU: Deferred. Musculoskeletal: No clubbing / cyanosis of digits/nails. No joint deformity upper and lower extremities.  Skin: No rashes, lesions, ulcers on limited skin evaluation. No induration; Warm and dry.  Neurologic: CN 2-12 grossly intact with no focal deficits. Romberg sign and cerebellar reflexes not assessed.  Psychiatric: Normal judgment and insight. Alert and oriented x 3. Normal mood and appropriate affect.   Data Reviewed: I have personally  reviewed following labs and imaging studies  CBC: Recent Labs  Lab 07/29/21 2221 07/30/21 0728 07/31/21 0925  WBC 7.2 7.7 6.3  NEUTROABS 4.3  --   --   HGB 12.4 12.2 12.0  HCT 36.1 35.9* 35.4*  MCV 88.9 88.6 90.5  PLT 141* 140* 947*   Basic Metabolic Panel: Recent Labs  Lab 07/29/21 2221 07/30/21 0728 07/31/21 0925  NA 127* 132* 132*  K 4.2 4.7 3.5  CL 96* 101 97*  CO2 22 22 26   GLUCOSE 105* 98 153*  BUN 25* 21 24*  CREATININE 0.99 0.99 1.09*  CALCIUM 8.9 8.8* 8.4*  MG  --   --  2.0  PHOS  --   --  3.7   GFR: Estimated Creatinine Clearance: 22.1 mL/min (A) (by C-G formula based on SCr of 1.09 mg/dL (H)). Liver Function Tests: Recent Labs  Lab 07/29/21 2221 07/30/21 0728 07/31/21 0925  AST 203* 152* 70*  ALT 309* 262* 177*  ALKPHOS 116 107 97  BILITOT 1.0 0.8 0.7  PROT 5.9* 5.4* 5.6*  ALBUMIN 3.6 3.4* 3.5   Recent Labs  Lab 07/29/21 2221  LIPASE 39   No results for input(s): AMMONIA in the last 168 hours. Coagulation Profile: No results for input(s): INR, PROTIME in the last 168 hours. Cardiac Enzymes: No results for input(s): CKTOTAL, CKMB, CKMBINDEX, TROPONINI in the last 168 hours. BNP (last 3 results) No results for input(s): PROBNP in the last 8760 hours. HbA1C: No results for input(s): HGBA1C in the last 72 hours. CBG: No results for input(s): GLUCAP in the last 168 hours. Lipid Profile: No results for input(s): CHOL, HDL, LDLCALC, TRIG, CHOLHDL, LDLDIRECT in the last 72 hours. Thyroid Function Tests: No results for input(s): TSH, T4TOTAL, FREET4, T3FREE, THYROIDAB in the last 72 hours. Anemia Panel: No results for input(s): VITAMINB12, FOLATE, FERRITIN, TIBC, IRON, RETICCTPCT in the last 72 hours. Sepsis Labs: No results for input(s): PROCALCITON, LATICACIDVEN in the last 168 hours.  Recent Results (from the past 240 hour(s))  Resp Panel by RT-PCR (Flu A&B, Covid) Nasopharyngeal Swab     Status: None   Collection Time: 07/29/21 11:44 PM    Specimen: Nasopharyngeal Swab; Nasopharyngeal(NP) swabs in vial transport medium  Result Value Ref Range Status   SARS Coronavirus 2 by RT PCR NEGATIVE NEGATIVE Final    Comment: (NOTE) SARS-CoV-2 target nucleic acids  are NOT DETECTED.  The SARS-CoV-2 RNA is generally detectable in upper respiratory specimens during the acute phase of infection. The lowest concentration of SARS-CoV-2 viral copies this assay can detect is 138 copies/mL. A negative result does not preclude SARS-Cov-2 infection and should not be used as the sole basis for treatment or other patient management decisions. A negative result may occur with  improper specimen collection/handling, submission of specimen other than nasopharyngeal swab, presence of viral mutation(s) within the areas targeted by this assay, and inadequate number of viral copies(<138 copies/mL). A negative result must be combined with clinical observations, patient history, and epidemiological information. The expected result is Negative.  Fact Sheet for Patients:  EntrepreneurPulse.com.au  Fact Sheet for Healthcare Providers:  IncredibleEmployment.be  This test is no t yet approved or cleared by the [place] FDA and  has been authorized for detection and/or diagnosis of SARS-CoV-2 by FDA under an Emergency Use Authorization (EUA). This EUA will remain  in effect (meaning this test can be used) for the duration of the COVID-19 declaration under Section 564(b)(1) of the Act, 21 U.S.C.section 360bbb-3(b)(1), unless the authorization is terminated  or revoked sooner.       Influenza A by PCR NEGATIVE NEGATIVE Final   Influenza B by PCR NEGATIVE NEGATIVE Final    Comment: (NOTE) The Xpert Xpress SARS-CoV-2/FLU/RSV plus assay is intended as an aid in the diagnosis of influenza from Nasopharyngeal swab specimens and should not be used as a sole basis for treatment. Nasal washings and aspirates are  unacceptable for Xpert Xpress SARS-CoV-2/FLU/RSV testing.  Fact Sheet for Patients: EntrepreneurPulse.com.au  Fact Sheet for Healthcare Providers: IncredibleEmployment.be  This test is not yet approved or cleared by the [place] FDA and has been authorized for detection and/or diagnosis of SARS-CoV-2 by FDA under an Emergency Use Authorization (EUA). This EUA will remain in effect (meaning this test can be used) for the duration of the COVID-19 declaration under Section 564(b)(1) of the Act, 21 U.S.C. section 360bbb-3(b)(1), unless the authorization is terminated or revoked.  Performed at [facility], [address] [place], [place] [address]     RN Pressure Injury Documentation:     Estimated body mass index is 18.67 kg/m as calculated from the following:   Height as of this encounter: 5\' 2"  (1.575 m).   Weight as of this encounter: 46.3 kg.  Malnutrition Type:   Malnutrition Characteristics:   Nutrition Interventions:    Radiology Studies: CT Abdomen Pelvis W Contrast  Result Date: 07/29/2021 CLINICAL DATA:  UTI, recurrent/complicated (Female) EXAM: CT ABDOMEN AND PELVIS WITH CONTRAST TECHNIQUE: Multidetector CT imaging of the abdomen and pelvis was performed using the standard protocol following bolus administration of intravenous contrast. RADIATION DOSE REDUCTION: This exam was performed according to the departmental dose-optimization program which includes automated exposure control, adjustment of the mA and/or kV according to patient size and/or use of iterative reconstruction technique. CONTRAST:  82mL OMNIPAQUE IOHEXOL 300 MG/ML  SOLN COMPARISON:  05/07/2013 FINDINGS: Lower chest: Small right pleural effusion and trace left pleural effusion. Right base atelectasis. Cardiomegaly. Hepatobiliary: Small gallstones within the gallbladder. No focal hepatic abnormality. No biliary ductal dilatation. Pancreas: No focal  abnormality or ductal dilatation. Spleen: No focal abnormality.  Normal size. Adrenals/Urinary Tract: Bilateral renal cysts. There is an enhanced seen mass in the midpole of the left kidney measuring 2.1 cm concerning for renal cell carcinoma. No hydronephrosis. Adrenal glands and urinary bladder unremarkable. Stomach/Bowel: Sigmoid diverticulosis. No active diverticulitis. Mild diffuse gaseous  distention of bowel, predominantly colon. No bowel obstruction. Vascular/Lymphatic: Aortic atherosclerosis. No evidence of aneurysm or adenopathy. Reproductive: Prior hysterectomy.  No adnexal masses. Other: No free fluid or free air. Musculoskeletal: No acute bony abnormality. IMPRESSION: Bilateral renal cysts. 2.1 cm enhancing lesion in the midpole of the left kidney concerning for renal cell carcinoma. No stones or hydronephrosis.  Urinary bladder unremarkable. Sigmoid diverticulosis. Cholelithiasis. Small bilateral pleural effusions, right greater than left. Aortic atherosclerosis. Electronically Signed   By: Rolm Baptise M.D.   On: 07/29/2021 23:33   ECHOCARDIOGRAM COMPLETE  Result Date: 07/31/2021    ECHOCARDIOGRAM REPORT   Patient Name:   Carrie [name] Date of Exam: 07/31/2021 Medical Rec #:  [id number]     Height:       62.0 in Accession #:    [id number]    Weight:       102.1 lb Date of Birth:  12-Mar-1923    BSA:          1.436 m Patient Age:    77 years      BP:           114/50 mmHg Patient Gender: F             HR:           59 bpm. Exam Location:  [facility] Procedure: 2D Echo, Color Doppler and Cardiac Doppler         REPORT CONTAINS CRITICAL RESULT Reported to: 01.26.2023 on 07/31/2021 8:48:00 AM              thrombus found in RA. Indications:     I35.0 Aortic stenosis  History:         Patient has no prior history of Echocardiogram examinations.                  Risk Factors:Hypertension and Dyslipidemia. Hx of breast                  cancer.  Sonographer:     Charmayne Sheer Referring Phys:  [id number] [place] TANG  Diagnosing Phys: Yolonda Kida MD IMPRESSIONS  1. Concentric LVH . Bright appearance . Consider an infiltrative process ie.. Amyloid.  2. Mobile mass in RA probable thrombus . consider anticougulation 3-6 months then repeat echo.TEE problbly not necessary.  3. Severe AS/mild AI.  4. Left ventricular ejection fraction, by estimation, is 65 to 70%. The left ventricle has normal function. The left ventricle has no regional wall motion abnormalities. There is moderate concentric left ventricular hypertrophy. Left ventricular diastolic parameters are consistent with Grade II diastolic dysfunction (pseudonormalization).  5. Right ventricular systolic function is normal. The right ventricular size is normal. Mildly increased right ventricular wall thickness.  6. The mitral valve is grossly normal. Mild mitral valve regurgitation.  7. The aortic valve is calcified. There is moderate calcification of the aortic valve. There is moderate thickening of the aortic valve. Aortic valve regurgitation is mild. Severe aortic valve stenosis. FINDINGS  Left Ventricle: Left ventricular ejection fraction, by estimation, is 65 to 70%. The left ventricle has normal function. The left ventricle has no regional wall motion abnormalities. The left ventricular internal cavity size was normal in size. There is  moderate concentric left ventricular hypertrophy. Left ventricular diastolic parameters are consistent with Grade II diastolic dysfunction (pseudonormalization). Right Ventricle: The right ventricular size is normal. Mildly increased right ventricular wall thickness. Right ventricular systolic function is normal. Left Atrium: Left atrial size was normal in  size. Right Atrium: Right atrial size was normal in size. Pericardium: Trivial pericardial effusion is present. Mitral Valve: The mitral valve is grossly normal. Mild mitral valve regurgitation. MV peak gradient, 5.7 mmHg. The mean mitral valve gradient is 2.0 mmHg. Tricuspid  Valve: The tricuspid valve is normal in structure. Tricuspid valve regurgitation is mild. Aortic Valve: The aortic valve is calcified. There is moderate calcification of the aortic valve. There is moderate thickening of the aortic valve. There is moderate to severe aortic valve annular calcification. Aortic valve regurgitation is mild. Aortic  regurgitation PHT measures 382 msec. Severe aortic stenosis is present. Aortic valve mean gradient measures 49.0 mmHg. Aortic valve peak gradient measures 89.9 mmHg. Aortic valve area, by VTI measures 0.60 cm. Pulmonic Valve: The pulmonic valve was normal in structure. Pulmonic valve regurgitation is not visualized. Aorta: The ascending aorta was not well visualized. IAS/Shunts: No atrial level shunt detected by color flow Doppler. Additional Comments: Concentric LVH . Bright appearance . Consider an infiltrative process ie.. Amyloid. Mobile mass in RA probable thrombus . consider anticougulation 3-6 months then repeat echo.TEE problbly not necessary. Severe AS/mild AI.  LEFT VENTRICLE PLAX 2D LVIDd:         3.29 cm   Diastology LVIDs:         2.25 cm   LV e' medial:    4.24 cm/s LV PW:         1.61 cm   LV E/e' medial:  26.2 LV IVS:        1.11 cm   LV e' lateral:   8.59 cm/s LVOT diam:     1.60 cm   LV E/e' lateral: 12.9 LV SV:         74 LV SV Index:   51 LVOT Area:     2.01 cm  RIGHT VENTRICLE RV Basal diam:  3.40 cm LEFT ATRIUM             Index        RIGHT ATRIUM           Index LA diam:        3.20 cm 2.23 cm/m   RA Area:     13.50 cm LA Vol (A2C):   34.8 ml 24.23 ml/m  RA Volume:   32.20 ml  22.42 ml/m LA Vol (A4C):   47.1 ml 32.79 ml/m LA Biplane Vol: 40.9 ml 28.47 ml/m  AORTIC VALVE                     PULMONIC VALVE AV Area (Vmax):    0.71 cm      PV Vmax:       1.25 m/s AV Area (Vmean):   0.65 cm      PV Vmean:      92.500 cm/s AV Area (VTI):     0.60 cm      PV VTI:        0.271 m AV Vmax:           474.00 cm/s   PV Peak grad:  6.2 mmHg AV Vmean:           331.000 cm/s  PV Mean grad:  4.0 mmHg AV VTI:            1.220 m AV Peak Grad:      89.9 mmHg AV Mean Grad:      49.0 mmHg LVOT Vmax:         168.00 cm/s LVOT Vmean:  107.000 cm/s LVOT VTI:          0.367 m LVOT/AV VTI ratio: 0.30 AI PHT:            382 msec  AORTA Ao Root diam: 2.90 cm MITRAL VALVE MV Area (PHT): 4.63 cm     SHUNTS MV Area VTI:   2.15 cm     Systemic VTI:  0.37 m MV Peak grad:  5.7 mmHg     Systemic Diam: 1.60 cm MV Mean grad:  2.0 mmHg MV Vmax:       1.19 m/s MV Vmean:      74.0 cm/s MV Decel Time: 164 msec MV E velocity: 111.00 cm/s MV A velocity: 94.30 cm/s MV E/A ratio:  1.18 Dwayne D Callwood MD Electronically signed by Yolonda Kida MD Signature Date/Time: 07/31/2021/11:06:00 AM    Final     Scheduled Meds:  benazepril  10 mg Oral Daily   brimonidine  1 drop Right Eye BID   And   timolol  1 drop Right Eye BID   heparin  3,000 Units Intravenous Once   levothyroxine  75 mcg Oral Q0600   prednisoLONE acetate  1 drop Left Eye QID   Continuous Infusions:  heparin      LOS: 0 days   Kerney Elbe, DO [facility] PAGER is on AMION  If 7PM-7AM, please contact night-coverage [email or web address]

## 2021-07-31 NOTE — Plan of Care (Signed)

## 2021-07-31 NOTE — Progress Notes (Signed)
*  PRELIMINARY RESULTS* Echocardiogram 2D Echocardiogram has been performed.  Carrie [name] Isip 07/31/2021, 8:47 AM

## 2021-07-31 NOTE — TOC Progression Note (Signed)
Transition of Care [facility]) - Progression Note    Patient Details  Name: Carrie [name] MRN: [id number] Date of Birth: 26-May-1923  Transition of Care [facility]) CM/SW [place], RN Phone Number: 07/31/2021, 12:53 PM  Clinical Narrative:    Transition of Care (TOC) Screening Note   Patient Details  Name: Carrie [name] Date of Birth: [birth year]   Transition of Care [facility]) CM/SW Contact:    Conception Oms, RN Phone Number: 07/31/2021, 12:53 PM    Transition of Care Department [facility]) has reviewed patient and no TOC needs have been identified at this time. We will continue to monitor patient advancement through interdisciplinary progression rounds. If new patient transition needs arise, please place a TOC consult.           Expected Discharge Plan and Services                                                 Social Determinants of Health (SDOH) Interventions    Readmission Risk Interventions No flowsheet data found.

## 2021-07-31 NOTE — Discharge Summary (Signed)
Physician Discharge Summary  Carrie [name] WPY:[id number] DOB: 1922/07/28 DOA: 07/29/2021  PCP: Marinda Elk, MD  Admit date: 07/29/2021 Discharge date: 08/01/2021  Admitted From: Home Disposition: Home  Recommendations for Outpatient Follow-up:  Follow up with PCP in 1-2 weeks Follow up with Palliative Care in the outpatient setting and Transition to Hospice when Appropriate Follow up with Cardiology Dr. Nehemiah Massed within the next few weeks Please obtain CMP/CBC, Mag, Phos in one week Please follow up on the following pending results:  Home Health: No  Equipment/Devices: None    Discharge Condition: Stable  CODE STATUS: DO NOT RESUSCITATE  Diet recommendation: Heart Healthy Diet with 1500 mL Fluid Restriction  Brief/Interim Summary: The patient is a 88 year old elderly Caucasian female with past medical history significant for but not limited to severe aortic stenosis was declined aortic valve replacement in the past, history of hypertension, hypothyroidism, CKD stage IIIa, recent chronic hyponatremia with a baseline creatinine 127-133, history of acute transaminitis back in March 2022 believed related to liver congestion at that time as well as other comorbidities who was sent to the ED by her PCP due to abnormal sodium levels as well as LFTs.  Patient became concerned if she had a episode of urinary retention which was then since resolved.  She states this is happened in the past.  She denies any chest pain, cough, abdominal pain, nausea, vomiting or diarrhea or dysuria.  She does endorse generalized weakness.  On arrival to the ED she was noted to have a BP of 179/68 and was mildly tachypneic and slightly bradycardic.  Her blood work showed a sodium of 127 and AST and ALT elevated into the 100s of 203 and 309 respectively.  She did have a mild thrombocytopenia.  She had a CT of the abdomen pelvis done which showed bilateral renal cyst with a 2.1 cm enhancing lesion in the midpole of  the left kidney concerning for renal cell carcinoma.  She did also have small bilateral pleural effusions worse on the right compared to left and some slight cardiomegaly.  In the ED she was given 500 mL bolus. Cardiology was consulted and recommended Diuresis and gave the patient IV Lasix 40 mg x1 with improvement and they gave her an additional 20 mg IV.  Cardiology then recommended 20 mg of p.o. Lasix as needed for weight gain greater than 5 pounds in 2 days along with fluid restriction.  She had an echocardiogram that was repeated this hospitalization which showed right atrial thrombus so she was loaded with IV heparin and they are recommended continuing for another day and then transition to oral Eliquis at discharge for 3 to 6 months. Will transition her to 5 mg po Apixaban as she is stable and have cardiology adjust the dose or change anticoagulation outpatient setting if her medication is too unaffordable. She is improved and deemed medically stable to follow up with PCP and Cardiology. Palliative Care saw her this admission and recommending outpatient Palliative Follow up as she wants no workup of her Renal Mass.   Discharge Diagnoses:  Principal Problem:   Generalized weakness Active Problems:   Hyponatremia   Transaminitis   CKD (chronic kidney disease) stage 3, GFR 30-59 ml/min (HCC)   Hypothyroidism, unspecified   Severe aortic valve stenosis   Bilateral pleural effusion   Abnormal CT scan, kidney  Generalized Weakness -Suspect multifactorial, possibly related to severe aortic stenosis, possible malignancy based on abnormality seen on kidney concerning for renal cell CA, hyponatremia but  now likely in the setting of her right atrial thrombus -Initially she was given 500 mL bolus and will not give any more fluids now currently getting diuresed -Will consult Nutrition and PT/OT to evaluate and treat -PT OT recommending no follow-up    Right Atrial Thrombus -Echocardiogram done and  showed a mobile mass in the right atrium which was probably thrombus -Recommendation was for anticoagulation for 3 to 6 months with repeat echocardiogram and TEE it was probably not necessary -Patient was initiated on heparin drip by cardiology and will be transitioned to oral Eliquis this a.m. for discharge for least 3 to 6 months   Hyponatremia, Chronic - Baseline sodium 127-133 and now sodium has gone improved from 127 is now 132 again yesterday and today it is 130 and stable -Continue to monitor and trend and repeat CMP in the a.m.   Elevated LFT's/Abnormal LFTs/Transaminitis, Acute on chronic -AST and ALT in the 300s compared to 2 low 100s just under a year ago; Now AST is trending down and was 43 and ALT is 127 -CT abdomen with no abnormality seen on kidneys -IV in the setting of hepatic congestion from volume overload -Improving as she received diuresis yesterday and will send her on as needed diuresis -Continue to monitor and trend hepatic function panel repeat CMP within 1 week   CKD (chronic kidney disease) stage 3, GFR 30-59 ml/min (HCC) -At baseline; patient's BUNs/creatinine went from 25/0.99-> 21/0.99 -> 24/1.09 yesterday and is now further improved to 20/1.01 -Avoid further nephrotoxic medications, contrast dyes, hypotension and renally dose medications -Repeat CMP in a.m. and she did receive a 500 mL bolus we will hold off any further fluid boluses -Continue to monitor and trend renal function carefully and repeat CMP in the a.m.   Intermittent Urinary Retention -Resolved spontaneously, suspecting age-related/pelvic floor dysfunction -Urinalysis unremarkable and showed clear appearance with 15 ketones, negative leukocytes, negative nitrites, urine specific gravity of less than 1.005, no bacteria seen and 0-5 RBCs per high-power field, no squamous epithelial cells and 0-5 WBC -Continue to have the patient ambulate and have her follow-up outpatient  Normocytic  Anemia -Mild -Patient's hemoglobin/hematocrit went from 12.4/36.1 -> 12.2/35.9 -> 12.0/35.4 -> 11.8/34.9 -Check anemia panel in outpatient setting -Continue to monitor for signs and symptoms bleeding; currently no overt bleeding noted -Repeat CBC within 1 week   Hypertensive Urgency -BP 179/68 on arrival -Continue home antihypertensives with benazepril 10 mg daily and titrate. Will avoid thiazide diuretics -Continue to monitor blood pressures per protocol and last blood pressure reading was 114/50   Hypothyroidism, unspecified -Continue Levothyroxine -TSH was 2.926   Severe aortic valve stenosis Bilateral pleural effusion/cardiomegaly Acute on chronic grade 2 diastolic dysfunction -Patient was fluid overload and initially she was given IV bolus of 500 mL but now given diuresis with IV Lasix 40 mg x 1 yesterday and will be given another IV 20 mg x 1 today -Patient did admit to drinking more water than usual and was consuming 7 cups as opposed to 5 and has not been really compliant with her Lasix -BNP obtained and was elevated to 2368.0 -Will consult Cardiology for further evaluation and recommendations and they diuresed her with IV Lasix while she was hospitalized -Strict I's and O's and daily weights -Repeat echocardiogram done here showed an EF of 65 to 70% with grade 2 diastolic dysfunction -Cardiology recommending that the patient be discharged home on Lasix 20 mg p.o. daily if she has a weight gain of greater than 5 pounds  in 2 days -Echocardiogram continues to show severe AS and mild AI -Her legs are less swollen today and will continue monitor for signs and symptoms of volume overload and follow-up on cardiology recommendation -She has a follow-up with Dr. Nehemiah Massed within 3 weeks that she should keep   Abnormal CT scan, kidney -Enhancing lesion left kidney concerning for renal cell CA -Consulted Palliative Care for goals of care discussion and after further goals of care  discussion patient does not want any further work-up of her renal mass -Palliative care recommending outpatient palliative care follow-up and transition to hospice when appropriate   Thrombocytopenia -Patient's platelet count went from 141 is now 140 and is now dropped to 132 yesterday and today is back up to 130s -Continue to monitor for signs and symptoms of bleeding now that she is going to be initiated on anticoagulation; currently no overt bleeding noted -Repeat CBC within 1 week   Discharge Instructions Discharge Instructions     Amb Referral to Palliative Care   Complete by: As directed    Call MD for:  difficulty breathing, headache or visual disturbances   Complete by: As directed    Call MD for:  extreme fatigue   Complete by: As directed    Call MD for:  hives   Complete by: As directed    Call MD for:  persistant dizziness or light-headedness   Complete by: As directed    Call MD for:  persistant nausea and vomiting   Complete by: As directed    Call MD for:  redness, tenderness, or signs of infection (pain, swelling, redness, odor or green/yellow discharge around incision site)   Complete by: As directed    Call MD for:  severe uncontrolled pain   Complete by: As directed    Call MD for:  temperature >100.4   Complete by: As directed    Diet - low sodium heart healthy   Complete by: As directed    1500 mL Fluid Restriction   Discharge instructions   Complete by: As directed    You were cared for by a hospitalist during your hospital stay. If you have any questions about your discharge medications or the care you received while you were in the hospital after you are discharged, you can call the unit and ask to speak with the hospitalist on call if the hospitalist that took care of you is not available. Once you are discharged, your primary care physician will handle any further medical issues. Please note that NO REFILLS for any discharge medications will be authorized  once you are discharged, as it is imperative that you return to your primary care physician (or establish a relationship with a primary care physician if you do not have one) for your aftercare needs so that they can reassess your need for medications and monitor your lab values.  Follow up with PCP and Cardiology within a few weeks. Take all medications as prescribed. If symptoms change or worsen please return to the ED for evaluation   Increase activity slowly   Complete by: As directed       Allergies as of 08/01/2021       Reactions   Other Other (See Comments)   "eye drop, unsure of name that caused  severe burning"   Amoxicillin Rash        Medication List     STOP taking these medications    amLODipine-benazepril 5-10 MG capsule Commonly known as: LOTREL  aspirin EC 81 MG tablet   meloxicam 7.5 MG tablet Commonly known as: MOBIC       TAKE these medications    apixaban 5 MG Tabs tablet Commonly known as: ELIQUIS Take 1 tablet (5 mg total) by mouth 2 (two) times daily.   calcium carbonate 500 MG chewable tablet Commonly known as: TUMS - dosed in mg elemental calcium Chew 1,000 mg by mouth daily.   Combigan 0.2-0.5 % ophthalmic solution Generic drug: brimonidine-timolol Place 1 drop into the right eye 2 (two) times daily.   DOCUSATE SODIUM RE Take 1 tablet by mouth daily.   furosemide 20 MG tablet Commonly known as: LASIX Take 1 tablet (20 mg total) by mouth daily as needed for fluid (PRN for weight gain for >5 lbs in 2 days). What changed: reasons to take this   levothyroxine 75 MCG tablet Commonly known as: SYNTHROID Take 75 mcg by mouth daily before breakfast.   Omega-3 1000 MG Caps Take 2,000 mg by mouth daily.   ondansetron 4 MG tablet Commonly known as: ZOFRAN Take 1 tablet (4 mg total) by mouth every 6 (six) hours as needed for nausea.   polyethylene glycol 17 g packet Commonly known as: MIRALAX / GLYCOLAX Take 17 g by mouth daily.    prednisoLONE acetate 1 % ophthalmic suspension Commonly known as: PRED FORTE Place 1 drop into the left eye 4 (four) times daily.   PRESERVISION AREDS 2 PO Take 1 tablet by mouth 2 (two) times daily.   Vitamin D3 25 MCG (1000 UT) Caps Take 1 capsule by mouth daily.        Allergies  Allergen Reactions   Other Other (See Comments)    "eye drop, unsure of name that caused  severe burning"   Amoxicillin Rash   Consultations: Cardiology  Procedures/Studies: CT Abdomen Pelvis W Contrast  Result Date: 07/29/2021 CLINICAL DATA:  UTI, recurrent/complicated (Female) EXAM: CT ABDOMEN AND PELVIS WITH CONTRAST TECHNIQUE: Multidetector CT imaging of the abdomen and pelvis was performed using the standard protocol following bolus administration of intravenous contrast. RADIATION DOSE REDUCTION: This exam was performed according to the departmental dose-optimization program which includes automated exposure control, adjustment of the mA and/or kV according to patient size and/or use of iterative reconstruction technique. CONTRAST:  49mL OMNIPAQUE IOHEXOL 300 MG/ML  SOLN COMPARISON:  05/07/2013 FINDINGS: Lower chest: Small right pleural effusion and trace left pleural effusion. Right base atelectasis. Cardiomegaly. Hepatobiliary: Small gallstones within the gallbladder. No focal hepatic abnormality. No biliary ductal dilatation. Pancreas: No focal abnormality or ductal dilatation. Spleen: No focal abnormality.  Normal size. Adrenals/Urinary Tract: Bilateral renal cysts. There is an enhanced seen mass in the midpole of the left kidney measuring 2.1 cm concerning for renal cell carcinoma. No hydronephrosis. Adrenal glands and urinary bladder unremarkable. Stomach/Bowel: Sigmoid diverticulosis. No active diverticulitis. Mild diffuse gaseous distention of bowel, predominantly colon. No bowel obstruction. Vascular/Lymphatic: Aortic atherosclerosis. No evidence of aneurysm or adenopathy. Reproductive: Prior  hysterectomy.  No adnexal masses. Other: No free fluid or free air. Musculoskeletal: No acute bony abnormality. IMPRESSION: Bilateral renal cysts. 2.1 cm enhancing lesion in the midpole of the left kidney concerning for renal cell carcinoma. No stones or hydronephrosis.  Urinary bladder unremarkable. Sigmoid diverticulosis. Cholelithiasis. Small bilateral pleural effusions, right greater than left. Aortic atherosclerosis. Electronically Signed   By: Rolm Baptise M.D.   On: 07/29/2021 23:33   ECHOCARDIOGRAM COMPLETE  Result Date: 07/31/2021    ECHOCARDIOGRAM REPORT   Patient Name:  Carrie [name] Date of Exam: 07/31/2021 Medical Rec #:  [id number]     Height:       62.0 in Accession #:    [id number]    Weight:       102.1 lb Date of Birth:  11-Feb-1923    BSA:          1.436 m Patient Age:    41 years      BP:           114/50 mmHg Patient Gender: F             HR:           59 bpm. Exam Location:  [facility] Procedure: 2D Echo, Color Doppler and Cardiac Doppler         REPORT CONTAINS CRITICAL RESULT Reported to: 01.26.2023 on 07/31/2021 8:48:00 AM              thrombus found in RA. Indications:     I35.0 Aortic stenosis  History:         Patient has no prior history of Echocardiogram examinations.                  Risk Factors:Hypertension and Dyslipidemia. Hx of breast                  cancer.  Sonographer:     Charmayne Sheer Referring Phys:  [id number] [place] TANG Diagnosing Phys: Yolonda Kida MD IMPRESSIONS  1. Concentric LVH . Bright appearance . Consider an infiltrative process ie.. Amyloid.  2. Mobile mass in RA probable thrombus . consider anticougulation 3-6 months then repeat echo.TEE problbly not necessary.  3. Severe AS/mild AI.  4. Left ventricular ejection fraction, by estimation, is 65 to 70%. The left ventricle has normal function. The left ventricle has no regional wall motion abnormalities. There is moderate concentric left ventricular hypertrophy. Left ventricular diastolic parameters are  consistent with Grade II diastolic dysfunction (pseudonormalization).  5. Right ventricular systolic function is normal. The right ventricular size is normal. Mildly increased right ventricular wall thickness.  6. The mitral valve is grossly normal. Mild mitral valve regurgitation.  7. The aortic valve is calcified. There is moderate calcification of the aortic valve. There is moderate thickening of the aortic valve. Aortic valve regurgitation is mild. Severe aortic valve stenosis. FINDINGS  Left Ventricle: Left ventricular ejection fraction, by estimation, is 65 to 70%. The left ventricle has normal function. The left ventricle has no regional wall motion abnormalities. The left ventricular internal cavity size was normal in size. There is  moderate concentric left ventricular hypertrophy. Left ventricular diastolic parameters are consistent with Grade II diastolic dysfunction (pseudonormalization). Right Ventricle: The right ventricular size is normal. Mildly increased right ventricular wall thickness. Right ventricular systolic function is normal. Left Atrium: Left atrial size was normal in size. Right Atrium: Right atrial size was normal in size. Pericardium: Trivial pericardial effusion is present. Mitral Valve: The mitral valve is grossly normal. Mild mitral valve regurgitation. MV peak gradient, 5.7 mmHg. The mean mitral valve gradient is 2.0 mmHg. Tricuspid Valve: The tricuspid valve is normal in structure. Tricuspid valve regurgitation is mild. Aortic Valve: The aortic valve is calcified. There is moderate calcification of the aortic valve. There is moderate thickening of the aortic valve. There is moderate to severe aortic valve annular calcification. Aortic valve regurgitation is mild. Aortic  regurgitation PHT measures 382 msec. Severe aortic stenosis is present. Aortic valve mean gradient measures 49.0  mmHg. Aortic valve peak gradient measures 89.9 mmHg. Aortic valve area, by VTI measures 0.60 cm.  Pulmonic Valve: The pulmonic valve was normal in structure. Pulmonic valve regurgitation is not visualized. Aorta: The ascending aorta was not well visualized. IAS/Shunts: No atrial level shunt detected by color flow Doppler. Additional Comments: Concentric LVH . Bright appearance . Consider an infiltrative process ie.. Amyloid. Mobile mass in RA probable thrombus . consider anticougulation 3-6 months then repeat echo.TEE problbly not necessary. Severe AS/mild AI.  LEFT VENTRICLE PLAX 2D LVIDd:         3.29 cm   Diastology LVIDs:         2.25 cm   LV e' medial:    4.24 cm/s LV PW:         1.61 cm   LV E/e' medial:  26.2 LV IVS:        1.11 cm   LV e' lateral:   8.59 cm/s LVOT diam:     1.60 cm   LV E/e' lateral: 12.9 LV SV:         74 LV SV Index:   51 LVOT Area:     2.01 cm  RIGHT VENTRICLE RV Basal diam:  3.40 cm LEFT ATRIUM             Index        RIGHT ATRIUM           Index LA diam:        3.20 cm 2.23 cm/m   RA Area:     13.50 cm LA Vol (A2C):   34.8 ml 24.23 ml/m  RA Volume:   32.20 ml  22.42 ml/m LA Vol (A4C):   47.1 ml 32.79 ml/m LA Biplane Vol: 40.9 ml 28.47 ml/m  AORTIC VALVE                     PULMONIC VALVE AV Area (Vmax):    0.71 cm      PV Vmax:       1.25 m/s AV Area (Vmean):   0.65 cm      PV Vmean:      92.500 cm/s AV Area (VTI):     0.60 cm      PV VTI:        0.271 m AV Vmax:           474.00 cm/s   PV Peak grad:  6.2 mmHg AV Vmean:          331.000 cm/s  PV Mean grad:  4.0 mmHg AV VTI:            1.220 m AV Peak Grad:      89.9 mmHg AV Mean Grad:      49.0 mmHg LVOT Vmax:         168.00 cm/s LVOT Vmean:        107.000 cm/s LVOT VTI:          0.367 m LVOT/AV VTI ratio: 0.30 AI PHT:            382 msec  AORTA Ao Root diam: 2.90 cm MITRAL VALVE MV Area (PHT): 4.63 cm     SHUNTS MV Area VTI:   2.15 cm     Systemic VTI:  0.37 m MV Peak grad:  5.7 mmHg     Systemic Diam: 1.60 cm MV Mean grad:  2.0 mmHg MV Vmax:       1.19 m/s MV Vmean:  74.0 cm/s MV Decel Time: 164 msec MV E  velocity: 111.00 cm/s MV A velocity: 94.30 cm/s MV E/A ratio:  1.18 Dwayne D Callwood MD Electronically signed by Yolonda Kida MD Signature Date/Time: 07/31/2021/11:06:00 AM    Final      Subjective: Seen and examined at bedside and she is sitting in the chair at bedside and feels fairly well.  Denies any chest pain or shortness breath.  No nausea or vomiting.  Denies any lightheadedness or dizziness.  Ready to go home.  Discharge Exam: Vitals:   08/01/21 0513 08/01/21 0842  BP: (!) 135/59 (!) 157/60  Pulse: (!) 56 64  Resp: 16 16  Temp: 98.6 F (37 C) 97.8 F (36.6 C)  SpO2: 98% 100%   Vitals:   07/31/21 2026 08/01/21 0513 08/01/21 0526 08/01/21 0842  BP: (!) 119/56 (!) 135/59  (!) 157/60  Pulse: (!) 57 (!) 56  64  Resp: 15 16  16   Temp: 98.4 F (36.9 C) 98.6 F (37 C)  97.8 F (36.6 C)  TempSrc:      SpO2: 98% 98%  100%  Weight:   46.2 kg   Height:       General: Pt is alert, awake, not in acute distress Cardiovascular: RRR, S1/S2 +, no rubs, no gallops; Has a prominent 4/6 systolic murmur Respiratory: Diminished bilaterally, no wheezing, no rhonchi; unlabored breathing Abdominal: Soft, NT, ND, bowel sounds + Extremities: no edema, no cyanosis  The results of significant diagnostics from this hospitalization (including imaging, microbiology, ancillary and laboratory) are listed below for reference.    Microbiology: Recent Results (from the past 240 hour(s))  Resp Panel by RT-PCR (Flu A&B, Covid) Nasopharyngeal Swab     Status: None   Collection Time: 07/29/21 11:44 PM   Specimen: Nasopharyngeal Swab; Nasopharyngeal(NP) swabs in vial transport medium  Result Value Ref Range Status   SARS Coronavirus 2 by RT PCR NEGATIVE NEGATIVE Final    Comment: (NOTE) SARS-CoV-2 target nucleic acids are NOT DETECTED.  The SARS-CoV-2 RNA is generally detectable in upper respiratory specimens during the acute phase of infection. The lowest concentration of SARS-CoV-2 viral  copies this assay can detect is 138 copies/mL. A negative result does not preclude SARS-Cov-2 infection and should not be used as the sole basis for treatment or other patient management decisions. A negative result may occur with  improper specimen collection/handling, submission of specimen other than nasopharyngeal swab, presence of viral mutation(s) within the areas targeted by this assay, and inadequate number of viral copies(<138 copies/mL). A negative result must be combined with clinical observations, patient history, and epidemiological information. The expected result is Negative.  Fact Sheet for Patients:  EntrepreneurPulse.com.au  Fact Sheet for Healthcare Providers:  IncredibleEmployment.be  This test is no t yet approved or cleared by the [place] FDA and  has been authorized for detection and/or diagnosis of SARS-CoV-2 by FDA under an Emergency Use Authorization (EUA). This EUA will remain  in effect (meaning this test can be used) for the duration of the COVID-19 declaration under Section 564(b)(1) of the Act, 21 U.S.C.section 360bbb-3(b)(1), unless the authorization is terminated  or revoked sooner.       Influenza A by PCR NEGATIVE NEGATIVE Final   Influenza B by PCR NEGATIVE NEGATIVE Final    Comment: (NOTE) The Xpert Xpress SARS-CoV-2/FLU/RSV plus assay is intended as an aid in the diagnosis of influenza from Nasopharyngeal swab specimens and should not be used as a sole basis for treatment.  Nasal washings and aspirates are unacceptable for Xpert Xpress SARS-CoV-2/FLU/RSV testing.  Fact Sheet for Patients: EntrepreneurPulse.com.au  Fact Sheet for Healthcare Providers: IncredibleEmployment.be  This test is not yet approved or cleared by the [place] FDA and has been authorized for detection and/or diagnosis of SARS-CoV-2 by FDA under an Emergency Use Authorization (EUA). This  EUA will remain in effect (meaning this test can be used) for the duration of the COVID-19 declaration under Section 564(b)(1) of the Act, 21 U.S.C. section 360bbb-3(b)(1), unless the authorization is terminated or revoked.  Performed at [facility], [address] [place], [place] [address]     Labs: BNP (last 3 results) Recent Labs    07/30/21 0728  BNP 6,283.1*   Basic Metabolic Panel: Recent Labs  Lab 07/29/21 2221 07/30/21 0728 07/31/21 0925 08/01/21 0620  NA 127* 132* 132* 130*  K 4.2 4.7 3.5 4.3  CL 96* 101 97* 97*  CO2 22 22 26 26   GLUCOSE 105* 98 153* 97  BUN 25* 21 24* 20  CREATININE 0.99 0.99 1.09* 1.01*  CALCIUM 8.9 8.8* 8.4* 8.5*  MG  --   --  2.0 2.1  PHOS  --   --  3.7 3.2   Liver Function Tests: Recent Labs  Lab 07/29/21 2221 07/30/21 0728 07/31/21 0925 08/01/21 0620  AST 203* 152* 70* 43*  ALT 309* 262* 177* 127*  ALKPHOS 116 107 97 81  BILITOT 1.0 0.8 0.7 0.5  PROT 5.9* 5.4* 5.6* 5.3*  ALBUMIN 3.6 3.4* 3.5 3.2*   Recent Labs  Lab 07/29/21 2221  LIPASE 39   No results for input(s): AMMONIA in the last 168 hours. CBC: Recent Labs  Lab 07/29/21 2221 07/30/21 0728 07/31/21 0925 08/01/21 0620  WBC 7.2 7.7 6.3 7.4  NEUTROABS 4.3  --   --  3.8  HGB 12.4 12.2 12.0 11.8*  HCT 36.1 35.9* 35.4* 34.9*  MCV 88.9 88.6 90.5 91.1  PLT 141* 140* 132* 136*   Cardiac Enzymes: No results for input(s): CKTOTAL, CKMB, CKMBINDEX, TROPONINI in the last 168 hours. BNP: Invalid input(s): POCBNP CBG: No results for input(s): GLUCAP in the last 168 hours. D-Dimer No results for input(s): DDIMER in the last 72 hours. Hgb A1c No results for input(s): HGBA1C in the last 72 hours. Lipid Profile No results for input(s): CHOL, HDL, LDLCALC, TRIG, CHOLHDL, LDLDIRECT in the last 72 hours. Thyroid function studies Recent Labs    08/01/21 0620  TSH 2.926   Anemia work up No results for input(s): VITAMINB12, FOLATE, FERRITIN, TIBC, IRON,  RETICCTPCT in the last 72 hours. Urinalysis    Component Value Date/Time   COLORURINE YELLOW 07/29/2021 2138   APPEARANCEUR CLEAR (A) 07/29/2021 2138   APPEARANCEUR Clear 05/06/2013 1914   LABSPEC <1.005 (L) 07/29/2021 2138   LABSPEC 1.005 05/06/2013 1914   PHURINE 5.5 07/29/2021 2138   GLUCOSEU NEGATIVE 07/29/2021 2138   GLUCOSEU Negative 05/06/2013 1914   HGBUR NEGATIVE 07/29/2021 2138   BILIRUBINUR NEGATIVE 07/29/2021 2138   BILIRUBINUR Negative 05/06/2013 1914   KETONESUR 15 (A) 07/29/2021 2138   PROTEINUR NEGATIVE 07/29/2021 2138   NITRITE NEGATIVE 07/29/2021 2138   LEUKOCYTESUR NEGATIVE 07/29/2021 2138   LEUKOCYTESUR 1+ 05/06/2013 1914   Sepsis Labs Invalid input(s): PROCALCITONIN,  WBC,  LACTICIDVEN Microbiology Recent Results (from the past 240 hour(s))  Resp Panel by RT-PCR (Flu A&B, Covid) Nasopharyngeal Swab     Status: None   Collection Time: 07/29/21 11:44 PM   Specimen: Nasopharyngeal Swab; Nasopharyngeal(NP) swabs  in vial transport medium  Result Value Ref Range Status   SARS Coronavirus 2 by RT PCR NEGATIVE NEGATIVE Final    Comment: (NOTE) SARS-CoV-2 target nucleic acids are NOT DETECTED.  The SARS-CoV-2 RNA is generally detectable in upper respiratory specimens during the acute phase of infection. The lowest concentration of SARS-CoV-2 viral copies this assay can detect is 138 copies/mL. A negative result does not preclude SARS-Cov-2 infection and should not be used as the sole basis for treatment or other patient management decisions. A negative result may occur with  improper specimen collection/handling, submission of specimen other than nasopharyngeal swab, presence of viral mutation(s) within the areas targeted by this assay, and inadequate number of viral copies(<138 copies/mL). A negative result must be combined with clinical observations, patient history, and epidemiological information. The expected result is Negative.  Fact Sheet for Patients:   EntrepreneurPulse.com.au  Fact Sheet for Healthcare Providers:  IncredibleEmployment.be  This test is no t yet approved or cleared by the [place] FDA and  has been authorized for detection and/or diagnosis of SARS-CoV-2 by FDA under an Emergency Use Authorization (EUA). This EUA will remain  in effect (meaning this test can be used) for the duration of the COVID-19 declaration under Section 564(b)(1) of the Act, 21 U.S.C.section 360bbb-3(b)(1), unless the authorization is terminated  or revoked sooner.       Influenza A by PCR NEGATIVE NEGATIVE Final   Influenza B by PCR NEGATIVE NEGATIVE Final    Comment: (NOTE) The Xpert Xpress SARS-CoV-2/FLU/RSV plus assay is intended as an aid in the diagnosis of influenza from Nasopharyngeal swab specimens and should not be used as a sole basis for treatment. Nasal washings and aspirates are unacceptable for Xpert Xpress SARS-CoV-2/FLU/RSV testing.  Fact Sheet for Patients: EntrepreneurPulse.com.au  Fact Sheet for Healthcare Providers: IncredibleEmployment.be  This test is not yet approved or cleared by the [place] FDA and has been authorized for detection and/or diagnosis of SARS-CoV-2 by FDA under an Emergency Use Authorization (EUA). This EUA will remain in effect (meaning this test can be used) for the duration of the COVID-19 declaration under Section 564(b)(1) of the Act, 21 U.S.C. section 360bbb-3(b)(1), unless the authorization is terminated or revoked.  Performed at [facility], [address] [place], [place] [address]    Time coordinating discharge: 35 minutes  SIGNED:  Kerney Elbe, DO [facility] 08/01/2021, 11:25 AM Pager is on [place]  If 7PM-7AM, please contact night-coverage [email or web address]

## 2021-08-01 LAB — CBC WITH DIFFERENTIAL/PLATELET
Abs Immature Granulocytes: 0.03 10*3/uL (ref 0.00–0.07)
Basophils Absolute: 0.1 10*3/uL (ref 0.0–0.1)
Basophils Relative: 1 %
Eosinophils Absolute: 0.2 10*3/uL (ref 0.0–0.5)
Eosinophils Relative: 2 %
HCT: 34.9 % — ABNORMAL LOW (ref 36.0–46.0)
Hemoglobin: 11.8 g/dL — ABNORMAL LOW (ref 12.0–15.0)
Immature Granulocytes: 0 %
Lymphocytes Relative: 30 %
Lymphs Abs: 2.2 10*3/uL (ref 0.7–4.0)
MCH: 30.8 pg (ref 26.0–34.0)
MCHC: 33.8 g/dL (ref 30.0–36.0)
MCV: 91.1 fL (ref 80.0–100.0)
Monocytes Absolute: 1.2 10*3/uL — ABNORMAL HIGH (ref 0.1–1.0)
Monocytes Relative: 16 %
Neutro Abs: 3.8 10*3/uL (ref 1.7–7.7)
Neutrophils Relative %: 51 %
Platelets: 136 10*3/uL — ABNORMAL LOW (ref 150–400)
RBC: 3.83 MIL/uL — ABNORMAL LOW (ref 3.87–5.11)
RDW: 13.5 % (ref 11.5–15.5)
WBC: 7.4 10*3/uL (ref 4.0–10.5)
nRBC: 0 % (ref 0.0–0.2)

## 2021-08-01 LAB — COMPREHENSIVE METABOLIC PANEL
ALT: 127 U/L — ABNORMAL HIGH (ref 0–44)
AST: 43 U/L — ABNORMAL HIGH (ref 15–41)
Albumin: 3.2 g/dL — ABNORMAL LOW (ref 3.5–5.0)
Alkaline Phosphatase: 81 U/L (ref 38–126)
Anion gap: 7 (ref 5–15)
BUN: 20 mg/dL (ref 8–23)
CO2: 26 mmol/L (ref 22–32)
Calcium: 8.5 mg/dL — ABNORMAL LOW (ref 8.9–10.3)
Chloride: 97 mmol/L — ABNORMAL LOW (ref 98–111)
Creatinine, Ser: 1.01 mg/dL — ABNORMAL HIGH (ref 0.44–1.00)
GFR, Estimated: 51 mL/min — ABNORMAL LOW (ref >60)
Glucose, Bld: 97 mg/dL (ref 70–99)
Potassium: 4.3 mmol/L (ref 3.5–5.1)
Sodium: 130 mmol/L — ABNORMAL LOW (ref 135–145)
Total Bilirubin: 0.5 mg/dL (ref 0.3–1.2)
Total Protein: 5.3 g/dL — ABNORMAL LOW (ref 6.5–8.1)

## 2021-08-01 LAB — MAGNESIUM: Magnesium: 2.1 mg/dL (ref 1.7–2.4)

## 2021-08-01 LAB — TSH: TSH: 2.926 u[IU]/mL (ref 0.350–4.500)

## 2021-08-01 LAB — HEPARIN LEVEL (UNFRACTIONATED): Heparin Unfractionated: 0.5 IU/mL (ref 0.30–0.70)

## 2021-08-01 LAB — PHOSPHORUS: Phosphorus: 3.2 mg/dL (ref 2.5–4.6)

## 2021-08-01 MED ORDER — BENAZEPRIL HCL 10 MG PO TABS: 10.0000 mg | ORAL_TABLET | Freq: Every day | ORAL | 0 refills | Status: DC

## 2021-08-01 MED ORDER — APIXABAN 5 MG PO TABS
5.0000 mg | ORAL_TABLET | Freq: Two times a day (BID) | ORAL | Status: DC
  Administered 2021-08-01: 5 mg via ORAL
  Filled 2021-08-01: qty 1

## 2021-08-01 MED ORDER — FUROSEMIDE 20 MG PO TABS: 20.0000 mg | ORAL_TABLET | Freq: Every day | ORAL | 0 refills | Status: DC | PRN

## 2021-08-01 MED ORDER — ONDANSETRON HCL 4 MG PO TABS: 4.0000 mg | ORAL_TABLET | Freq: Four times a day (QID) | ORAL | 0 refills | Status: DC | PRN

## 2021-08-01 MED ORDER — FUROSEMIDE 20 MG PO TABS: 20.0000 mg | ORAL_TABLET | Freq: Two times a day (BID) | ORAL | Status: DC | PRN

## 2021-08-01 MED ORDER — APIXABAN 5 MG PO TABS: 5.0000 mg | ORAL_TABLET | Freq: Two times a day (BID) | ORAL | 0 refills | Status: DC

## 2021-08-01 NOTE — Consult Note (Signed)
ANTICOAGULATION CONSULT NOTE - Initial Consult  Pharmacy Consult for heparin Indication: VTE Treatment  Allergies  Allergen Reactions   Other Other (See Comments)    "eye drop, unsure of name that caused  severe burning"   Amoxicillin Rash    Patient Measurements: Height: 5\' 2"  (157.5 cm) Weight: 46.2 kg (101 lb 13.6 oz) IBW/kg (Calculated) : 50.1 Heparin Dosing Weight: 46.3kg  Vital Signs: Temp: 98.6 F (37 C) (01/27 0513) BP: 135/59 (01/27 0513) Pulse Rate: 56 (01/27 0513)  Labs: Recent Labs    07/29/21 2221 07/30/21 0728 07/31/21 0925 07/31/21 1227 07/31/21 2018 08/01/21 0620  HGB 12.4 12.2 12.0  --   --  11.8*  HCT 36.1 35.9* 35.4*  --   --  34.9*  PLT 141* 140* 132*  --   --  136*  APTT  --   --   --  36  --   --   HEPARINUNFRC  --   --   --   --  0.84* 0.50  CREATININE 0.99 0.99 1.09*  --   --   --      Estimated Creatinine Clearance: 22 mL/min (A) (by C-G formula based on SCr of 1.09 mg/dL (H)).   Medical History: Past Medical History:  Diagnosis Date   Breast cancer ([place]) 2012   right   Hypertension    Thyroid disease      Medications:  PTA: N/A  IP: Enoxparin 30mg  q24 (x2 doses), Heparin (1/26 >>>)  Allergies: No AC/APT allergies  Assessment: 89yoF with a PMH significant for severe aortic stenosis (AVA 0.36cm 04/2020), HTN, HLD, CKD 3, hypothyroidism who presented with a sodium of 125 and elevated liver enzymes. Cardiology is consulted to assist with diuresis and with her known severe aortic stenosis. Pharmacy consulted for management of heparin the setting of VTE treatment of right atrial thrombus.   Date Time HL Rate/Comment 1/26 2018 0.84 750 > 650 units/hr / supratherapeutic 1/27 0620 0.50 650 units/hr / therapeutic x1     Baseline Labs: aPTT - 36 Hgb - 12.0 Plts - 132   Goal of Therapy:  Heparin level 0.3-0.7 units/ml Monitor platelets by anticoagulation protocol: Yes   Plan:  Continue heparin IV infusion at 650  units/hr Check anti-Xa level in 8 hours and daily once consecutively therapeutic. Continue to monitor H&H and platelets daily while on heparin gtt.  Darrick Penna 08/01/2021,6:58 AM

## 2021-08-01 NOTE — Progress Notes (Signed)
Patient and family was given verbal and written discharge instruction, verbalize understanding and states they will comply. Patient was given a discount card for Eliquis.Patient was taken to car by wheelchair, no distress was noted when leaving the floor.

## 2021-08-01 NOTE — Progress Notes (Signed)
CARDIOLOGY CONSULT NOTE               Patient ID: Carrie [name] MRN: [id number] DOB/AGE: 09/04/1922 88 y.o.  Admit date: 07/29/2021 Referring Physician Dr. Raiford Noble Primary Provider Paulita Cradle, PA-C Primary Cardiologist Nehemiah Massed Reason for Consultation AS and fluid overload  HPI: The patient is a 88yoF with a PMH significant for severe aortic stenosis (AVA 0.36cm 04/2020), hypertension, hyperlipidemia, CKD 3, hypothyroidism who presented to [facility] ED 07/29/2021 at the request of her PCP with a sodium of 125 and elevated liver enzymes. Cardiology is consulted to assist with diuresis and with her known severe aortic stenosis. She continues to decline AVR.   Interval history: -no acute events. great UOP overnight. Denies chest pain, sob.  -heparin supratheraputic this morning, will dc and transition to oral eliquis at discharge.     Review of systems complete and found to be negative unless listed above   Past Medical History:  Diagnosis Date   Breast cancer ([place]) 2012   right   Hypertension    Thyroid disease     Past Surgical History:  Procedure Laterality Date   ABDOMINAL HYSTERECTOMY     APPENDECTOMY     MASTECTOMY Right 2012   TONSILLECTOMY      Medications Prior to Admission  Medication Sig Dispense Refill Last Dose   aspirin EC 81 MG tablet Take 81 mg by mouth.   07/28/2021   COMBIGAN 0.2-0.5 % ophthalmic solution Place 1 drop into the right eye 2 (two) times daily.   07/29/2021   DOCUSATE SODIUM RE Take 1 tablet by mouth daily.   07/28/2021   levothyroxine (SYNTHROID, LEVOTHROID) 75 MCG tablet Take 75 mcg by mouth daily before breakfast.    07/29/2021   Omega-3 1000 MG CAPS Take 2,000 mg by mouth daily.    07/29/2021   polyethylene glycol (MIRALAX / GLYCOLAX) 17 g packet Take 17 g by mouth daily.   07/28/2021   prednisoLONE acetate (PRED FORTE) 1 % ophthalmic suspension Place 1 drop into the left eye 4 (four) times daily.   07/29/2021   amLODipine-benazepril  (LOTREL) 5-10 MG capsule Hold until outpatient followup with your doctor due to your intermittent low blood pressure. (Patient not taking: Reported on 07/30/2021)   Not Taking   calcium carbonate (TUMS - DOSED IN MG ELEMENTAL CALCIUM) 500 MG chewable tablet Chew 1,000 mg by mouth daily.    07/28/2021   Cholecalciferol (VITAMIN D3) 1000 UNITS CAPS Take 1 capsule by mouth daily.    07/28/2021   furosemide (LASIX) 20 MG tablet Take 20 mg by mouth daily as needed for fluid.    PRN   meloxicam (MOBIC) 7.5 MG tablet Take 1 tablet (7.5 mg total) by mouth daily as needed for pain. (Patient not taking: Reported on 07/30/2021)   Not Taking   Multiple Vitamins-Minerals (PRESERVISION AREDS 2 PO) Take 1 tablet by mouth 2 (two) times daily.   07/28/2021   Social History   Socioeconomic History   Marital status: Widowed    Spouse name: Not on file   Number of children: Not on file   Years of education: Not on file   Highest education level: Not on file  Occupational History   Not on file  Tobacco Use   Smoking status: Former    Types: Cigarettes    Quit date: 52    Years since quitting: 56.1   Smokeless tobacco: Never  Substance and Sexual Activity   Alcohol use: No  Drug use: No   Sexual activity: Not on file  Other Topics Concern   Not on file  Social History Narrative   Not on file   Social Determinants of Health   Financial Resource Strain: Not on file  Food Insecurity: Not on file  Transportation Needs: Not on file  Physical Activity: Not on file  Stress: Not on file  Social Connections: Not on file  Intimate Partner Violence: Not on file    Family History  Problem Relation Age of Onset   Breast cancer Sister       Review of systems complete and found to be negative unless listed above   PHYSICAL EXAM General: Pleasant elderly and thin caucasian female, in no acute distress. Sitting upright in recliner  after finishing breakfast.  HEENT:  Normocephalic and atraumatic. Neck:   No JVD.  Lungs: Normal respiratory effort on room air. Clear to ascultation bilaterally. Heart: HRRR . Normal S1 and absent S2. 4/6 systolic murmur heard easily throughout with radiation to the right carotid. Radial & DP pulses 1+ bilaterally. Abdomen: soft, nontender to palpation.  Msk: Normal strength and tone for age. Extremities: chronic appearing venous stasis skin changes, trace LLE edema. No clubbing, cyanosis. Neuro: Alert and oriented X 3. Psych:  Mood appropriate, affect congruent.   Labs:   Lab Results  Component Value Date   WBC 7.4 08/01/2021   HGB 11.8 (L) 08/01/2021   HCT 34.9 (L) 08/01/2021   MCV 91.1 08/01/2021   PLT 136 (L) 08/01/2021    Recent Labs  Lab 08/01/21 0620  NA 130*  K 4.3  CL 97*  CO2 26  BUN 20  CREATININE 1.01*  CALCIUM 8.5*  PROT 5.3*  BILITOT 0.5  ALKPHOS 81  ALT 127*  AST 43*  GLUCOSE 97    No results found for: CKTOTAL, CKMB, CKMBINDEX, TROPONINI No results found for: CHOL No results found for: HDL No results found for: LDLCALC No results found for: TRIG No results found for: CHOLHDL No results found for: LDLDIRECT    Radiology: CT Abdomen Pelvis W Contrast  Result Date: 07/29/2021 CLINICAL DATA:  UTI, recurrent/complicated (Female) EXAM: CT ABDOMEN AND PELVIS WITH CONTRAST TECHNIQUE: Multidetector CT imaging of the abdomen and pelvis was performed using the standard protocol following bolus administration of intravenous contrast. RADIATION DOSE REDUCTION: This exam was performed according to the departmental dose-optimization program which includes automated exposure control, adjustment of the mA and/or kV according to patient size and/or use of iterative reconstruction technique. CONTRAST:  49mL OMNIPAQUE IOHEXOL 300 MG/ML  SOLN COMPARISON:  05/07/2013 FINDINGS: Lower chest: Small right pleural effusion and trace left pleural effusion. Right base atelectasis. Cardiomegaly. Hepatobiliary: Small gallstones within the gallbladder. No  focal hepatic abnormality. No biliary ductal dilatation. Pancreas: No focal abnormality or ductal dilatation. Spleen: No focal abnormality.  Normal size. Adrenals/Urinary Tract: Bilateral renal cysts. There is an enhanced seen mass in the midpole of the left kidney measuring 2.1 cm concerning for renal cell carcinoma. No hydronephrosis. Adrenal glands and urinary bladder unremarkable. Stomach/Bowel: Sigmoid diverticulosis. No active diverticulitis. Mild diffuse gaseous distention of bowel, predominantly colon. No bowel obstruction. Vascular/Lymphatic: Aortic atherosclerosis. No evidence of aneurysm or adenopathy. Reproductive: Prior hysterectomy.  No adnexal masses. Other: No free fluid or free air. Musculoskeletal: No acute bony abnormality. IMPRESSION: Bilateral renal cysts. 2.1 cm enhancing lesion in the midpole of the left kidney concerning for renal cell carcinoma. No stones or hydronephrosis.  Urinary bladder unremarkable. Sigmoid diverticulosis. Cholelithiasis. Small bilateral  pleural effusions, right greater than left. Aortic atherosclerosis. Electronically Signed   By: Rolm Baptise M.D.   On: 07/29/2021 23:33   ECHOCARDIOGRAM COMPLETE  Result Date: 07/31/2021    ECHOCARDIOGRAM REPORT   Patient Name:   Carrie [name] Date of Exam: 07/31/2021 Medical Rec #:  [id number]     Height:       62.0 in Accession #:    [id number]    Weight:       102.1 lb Date of Birth:  14-May-1923    BSA:          1.436 m Patient Age:    3 years      BP:           114/50 mmHg Patient Gender: F             HR:           59 bpm. Exam Location:  [facility] Procedure: 2D Echo, Color Doppler and Cardiac Doppler         REPORT CONTAINS CRITICAL RESULT Reported to: 01.26.2023 on 07/31/2021 8:48:00 AM              thrombus found in RA. Indications:     I35.0 Aortic stenosis  History:         Patient has no prior history of Echocardiogram examinations.                  Risk Factors:Hypertension and Dyslipidemia. Hx of breast                   cancer.  Sonographer:     Charmayne Sheer Referring Phys:  [id number] [place] Shay Bartoli Diagnosing Phys: Yolonda Kida MD IMPRESSIONS  1. Concentric LVH . Bright appearance . Consider an infiltrative process ie.. Amyloid.  2. Mobile mass in RA probable thrombus . consider anticougulation 3-6 months then repeat echo.TEE problbly not necessary.  3. Severe AS/mild AI.  4. Left ventricular ejection fraction, by estimation, is 65 to 70%. The left ventricle has normal function. The left ventricle has no regional wall motion abnormalities. There is moderate concentric left ventricular hypertrophy. Left ventricular diastolic parameters are consistent with Grade II diastolic dysfunction (pseudonormalization).  5. Right ventricular systolic function is normal. The right ventricular size is normal. Mildly increased right ventricular wall thickness.  6. The mitral valve is grossly normal. Mild mitral valve regurgitation.  7. The aortic valve is calcified. There is moderate calcification of the aortic valve. There is moderate thickening of the aortic valve. Aortic valve regurgitation is mild. Severe aortic valve stenosis. FINDINGS  Left Ventricle: Left ventricular ejection fraction, by estimation, is 65 to 70%. The left ventricle has normal function. The left ventricle has no regional wall motion abnormalities. The left ventricular internal cavity size was normal in size. There is  moderate concentric left ventricular hypertrophy. Left ventricular diastolic parameters are consistent with Grade II diastolic dysfunction (pseudonormalization). Right Ventricle: The right ventricular size is normal. Mildly increased right ventricular wall thickness. Right ventricular systolic function is normal. Left Atrium: Left atrial size was normal in size. Right Atrium: Right atrial size was normal in size. Pericardium: Trivial pericardial effusion is present. Mitral Valve: The mitral valve is grossly normal. Mild mitral valve regurgitation. MV  peak gradient, 5.7 mmHg. The mean mitral valve gradient is 2.0 mmHg. Tricuspid Valve: The tricuspid valve is normal in structure. Tricuspid valve regurgitation is mild. Aortic Valve: The aortic valve is calcified. There is moderate calcification of the  aortic valve. There is moderate thickening of the aortic valve. There is moderate to severe aortic valve annular calcification. Aortic valve regurgitation is mild. Aortic  regurgitation PHT measures 382 msec. Severe aortic stenosis is present. Aortic valve mean gradient measures 49.0 mmHg. Aortic valve peak gradient measures 89.9 mmHg. Aortic valve area, by VTI measures 0.60 cm. Pulmonic Valve: The pulmonic valve was normal in structure. Pulmonic valve regurgitation is not visualized. Aorta: The ascending aorta was not well visualized. IAS/Shunts: No atrial level shunt detected by color flow Doppler. Additional Comments: Concentric LVH . Bright appearance . Consider an infiltrative process ie.. Amyloid. Mobile mass in RA probable thrombus . consider anticougulation 3-6 months then repeat echo.TEE problbly not necessary. Severe AS/mild AI.  LEFT VENTRICLE PLAX 2D LVIDd:         3.29 cm   Diastology LVIDs:         2.25 cm   LV e' medial:    4.24 cm/s LV PW:         1.61 cm   LV E/e' medial:  26.2 LV IVS:        1.11 cm   LV e' lateral:   8.59 cm/s LVOT diam:     1.60 cm   LV E/e' lateral: 12.9 LV SV:         74 LV SV Index:   51 LVOT Area:     2.01 cm  RIGHT VENTRICLE RV Basal diam:  3.40 cm LEFT ATRIUM             Index        RIGHT ATRIUM           Index LA diam:        3.20 cm 2.23 cm/m   RA Area:     13.50 cm LA Vol (A2C):   34.8 ml 24.23 ml/m  RA Volume:   32.20 ml  22.42 ml/m LA Vol (A4C):   47.1 ml 32.79 ml/m LA Biplane Vol: 40.9 ml 28.47 ml/m  AORTIC VALVE                     PULMONIC VALVE AV Area (Vmax):    0.71 cm      PV Vmax:       1.25 m/s AV Area (Vmean):   0.65 cm      PV Vmean:      92.500 cm/s AV Area (VTI):     0.60 cm      PV VTI:         0.271 m AV Vmax:           474.00 cm/s   PV Peak grad:  6.2 mmHg AV Vmean:          331.000 cm/s  PV Mean grad:  4.0 mmHg AV VTI:            1.220 m AV Peak Grad:      89.9 mmHg AV Mean Grad:      49.0 mmHg LVOT Vmax:         168.00 cm/s LVOT Vmean:        107.000 cm/s LVOT VTI:          0.367 m LVOT/AV VTI ratio: 0.30 AI PHT:            382 msec  AORTA Ao Root diam: 2.90 cm MITRAL VALVE MV Area (PHT): 4.63 cm     SHUNTS MV Area VTI:   2.15 cm  Systemic VTI:  0.37 m MV Peak grad:  5.7 mmHg     Systemic Diam: 1.60 cm MV Mean grad:  2.0 mmHg MV Vmax:       1.19 m/s MV Vmean:      74.0 cm/s MV Decel Time: 164 msec MV E velocity: 111.00 cm/s MV A velocity: 94.30 cm/s MV E/A ratio:  1.18 Yolonda Kida MD Electronically signed by Yolonda Kida MD Signature Date/Time: 07/31/2021/11:06:00 AM    Final     ECHO 07/31/21  1. Concentric LVH . Bright appearance . Consider an infiltrative process  ie.. Amyloid.   2. Mobile mass in RA probable thrombus . consider anticougulation 3-6  months then repeat echo.TEE problbly not necessary.   3. Severe AS/mild AI.   4. Left ventricular ejection fraction, by estimation, is 65 to 70%. The  left ventricle has normal function. The left ventricle has no regional  wall motion abnormalities. There is moderate concentric left ventricular  hypertrophy. Left ventricular  diastolic parameters are consistent with Grade II diastolic dysfunction  (pseudonormalization).   5. Right ventricular systolic function is normal. The right ventricular  size is normal. Mildly increased right ventricular wall thickness.   6. The mitral valve is grossly normal. Mild mitral valve regurgitation.   7. The aortic valve is calcified. There is moderate calcification of the  aortic valve. There is moderate thickening of the aortic valve. Aortic  valve regurgitation is mild. Severe aortic valve stenosis.   TELEMETRY reviewed by me: sinus brady 59 bpm  ASSESSMENT AND PLAN:  The patient  is a 65yoF with a PMH significant for severe aortic stenosis (AVA 0.36cm 04/2020), hypertension, hyperlipidemia, CKD 3, hypothyroidism who presented to [facility] ED 07/29/2021 at the request of her PCP with a sodium of 125 and elevated liver enzymes. Cardiology is consulted to assist with diuresis and with her known severe aortic stenosis.   #Severe aortic stenosis (AVA 0.60cm 07/2021) #Elevated BNP #chronic hyponatremia #probable right atrial thrombus on echo 07/31/21 The patient reports drinking more water than usual (7 cups as opposed to 5) due to concerns of not urinating at night as per her usual schedule (but urinating normally throughout the day).  She appeared clinically volume overloaded on admission and pleural effusions seen on CT scan.  Her BNP is markedly elevated to 2368, sodium uptrending 125-127-132 s/p a 546mL bolus of fluids. Baseline is between 127-133. -s/p IV lasix 40 mg. Net negative 1.4L since admission. Sodium WNL today. Ordered one more dose of IV lasix 20mg  today. She will need to go home on lasix 20mg  to be taken if she has a weight gain of >5 pounds in 2 days.  -probable RA thrombus noted on echo -s/p IV heparin with therapeutic levels. Discussed risks and benefits of anticoagulation with patient and her nephews. They are agreeable to start on low dose eliquis 2.5 BID and follow up in office with Dr. Nehemiah Massed in ~3 weeks for consideration of continuation of anticoagulation. She reports no falls in the las 3-4 years. -recommend fluid restriction to 1.5L max per day, limiting salt intake, and strict monitoring of I&Os, daily weights.   #Transaminitis AST/ALT significantly elevated at 277/376 on admission - much improved after diuresis.  - likely in the setting of hepatic congestion.  #hypertension -peaking at 190/66 on admission, now improved. -continue benzapril 10mg .   #CKD 3 #renal mass on CT scan - baseline with Cr 0.99 and GFR 52 - 1.09 and 48 today.  -per palliative  care note, no further treatment is desired by patient.   This patient's plan of care was discussed and created with Dr. Lujean Amel and he is in agreement.  Signed: Tristan Schroeder , PA-C 08/01/2021, 8:43 AM

## 2021-08-25 ENCOUNTER — Telehealth: Payer: Self-pay

## 2021-08-25 NOTE — Telephone Encounter (Signed)
Attempted to contact patient and patient's nephew Randall Hiss to schedule a Palliative Care consult appointment. No answer left a message to return call.

## 2021-09-02 ENCOUNTER — Telehealth: Payer: Self-pay | Admitting: Student

## 2021-09-02 NOTE — Telephone Encounter (Signed)
Ret'd call to patient's nephew Bing Matter and discussed the Palliative referral/services and all questions were answered and he was in agreement with scheduling visit.  I have scheduled an In-home Consult for 09/04/21 @ 3 PM

## 2021-09-04 ENCOUNTER — Other Ambulatory Visit: Payer: Medicare Other | Admitting: Student

## 2021-09-04 ENCOUNTER — Other Ambulatory Visit: Payer: Self-pay

## 2021-09-04 DIAGNOSIS — R634 Abnormal weight loss: Secondary | ICD-10-CM

## 2021-09-04 DIAGNOSIS — I350 Nonrheumatic aortic (valve) stenosis: Secondary | ICD-10-CM

## 2021-09-04 DIAGNOSIS — Z515 Encounter for palliative care: Secondary | ICD-10-CM

## 2021-09-04 NOTE — Progress Notes (Signed)
[facility] Palliative Care Consult Note Telephone: [phone]  Fax: [phone]   Date of encounter: 09/04/21 3:05 PM PATIENT NAME: Carrie [name] [address] [place] [address]   [phone] (home)  DOB: 1923-03-26 MRN: [id number] PRIMARY CARE PROVIDER:    Marinda Elk, MD,  [place] [address] [phone]  REFERRING PROVIDER:   Marinda Elk, MD [address],  [place] [address] [phone]  RESPONSIBLE PARTY:    Contact Information     Name Relation Home Work [address]   Carrie [name] [phone]   Carrie [name]   [phone]   Carrie [name] Niece   [phone]        I met face to face with patient and family in the home. Palliative Care was asked to follow this patient by consultation request of  Marinda Elk, MD to address advance care planning and complex medical decision making. This is the initial visit.                                     ASSESSMENT AND PLAN / RECOMMENDATIONS:   Advance Care Planning/Goals of Care: Goals include to maximize quality of life and symptom management. Patient/health care surrogate gave his/her permission to discuss.Our advance care planning conversation included a discussion about:    The value and importance of advance care planning  Experiences with loved ones who have been seriously ill or have died  Exploration of personal, cultural or spiritual beliefs that might influence medical decisions  Exploration of goals of care in the event of a sudden injury or illness  Patient wishes to continue DNR; family has document., not on hand today.  CODE STATUS: DNR  Education on Palliative Medicine vs. Hospice services. Family request palliative involvement to monitor for changes/ declines and to provide symptom management. Patient would like to remain in her  home. She is open to hospitalization as needed. Will continue to provide ongoing support.   Symptom Management/Plan:  Aortic stenosis-patient with severe aortic stenosis. She denies fatigue, shortness of breath, chest pain, light headedness or dizziness. Will monitor for worsening symptoms. Follow up with cardiology as scheduled.   Weight loss-patient endorses a 10 pound loss in the past 1.5 years. She is encouraged to eta foods she enjoys, monitor sodium intake. Recommend nutritional supplement if eating less than 50% of a meal. Will monitor for further weight loss.   Follow up Palliative Care Visit: Palliative care will continue to follow for complex medical decision making, advance care planning, and clarification of goals. Return in 6-8 weeks or prn.  This visit was coded based on medical decision making (MDM).  PPS: 60%  HOSPICE ELIGIBILITY/DIAGNOSIS: TBD  Chief Complaint: Palliative Medicine initial consult.   HISTORY OF PRESENT ILLNESS:  Carrie [name] is a 88 y.o. year old female  with severe aortic stenosis, hypertension, bradycardia, hyperlipidemia, hypothyroidism, CKD 3 AAA, ischemic colitis. Patient hospitalized 1/24 through 08/01/21 due to hyponatremia.  During hospitalization she was also found to have bilateral renal cysts with a 2.1 cm lesion of the left kidney that is concerning for renal cell carcinoma. Patient and family declined any further work work-up  Resides at home. Denies pain, "very seldom has shortness of breath. " Weight 115, down to 105 in past 1.5 years. She receives Meals on [employer]  and family stop in frequently to check on her, provide meals. Sleep fair; she is up to bathroom 3-4 a night, which she has done for past 50+ years. Uses walker for ambulation at night; doesn't use during the day. She ambulates laps in her house usually 3 times a day. Dresses herself. No falls; last fall 3-4 years ago. Left eye completely blind, right eye with macular degeneration. Has med  alert. Denies need for [facility] therapy at this time. A 10-point ROS is negative, except for the pertinent positives and negatives detailed per the HPI.   History obtained from review of EMR, discussion with primary team, and interview with family, facility staff/caregiver and/or Carrie [name].  I reviewed available labs, medications, imaging, studies and related documents from the EMR.  Records reviewed and summarized above.   Physical Exam:  Pulse 72, resp 16, 160/80-manual, 163/75-per automatic cuff, sats 97% on room air. Constitutional: NAD General: frail appearing, thin EYES: right anicteric sclera, lids intact, no discharge  ENMT: intact hearing, oral mucous membranes moist, dentition intact CV: S1S2, RRR, murmur, no LE edema Pulmonary: LCTA, no increased work of breathing, no cough, room air Abdomen: normo-active BS + 4 quadrants, soft and non tender, no ascites GU: deferred MSK:+ sarcopenia, moves all extremities, ambulatory Skin: warm and dry, no rashes or wounds on visible skin Neuro:  no generalized weakness, no cognitive impairment Psych: non-anxious affect, pleasant, A and O x 3 Hem/lymph/immuno: no widespread bruising CURRENT PROBLEM LIST:  Patient Active Problem List   Diagnosis Date Noted   Bilateral pleural effusion 07/30/2021   Abnormal CT scan, kidney 07/30/2021   Hyponatremia 07/29/2021   Transaminitis 07/29/2021   Generalized weakness 07/29/2021   Symptomatic bradycardia 09/20/2020   CKD (chronic kidney disease) stage 3, GFR 30-59 ml/min (HCC) 03/03/2018   Hypothyroidism, unspecified 11/10/2016   Cellulitis 09/24/2016   Swelling of limb 09/24/2016   Pain in limb 09/24/2016   Severe aortic valve stenosis 04/01/2016   Osteopenia 12/06/2014   PAST MEDICAL HISTORY:  Active Ambulatory Problems    Diagnosis Date Noted   Osteopenia 12/06/2014   Cellulitis 09/24/2016   Swelling of limb 09/24/2016   Pain in limb 09/24/2016   Symptomatic bradycardia 09/20/2020    Hyponatremia 07/29/2021   Transaminitis 07/29/2021   Generalized weakness 07/29/2021   CKD (chronic kidney disease) stage 3, GFR 30-59 ml/min (HCC) 03/03/2018   Hypothyroidism, unspecified 11/10/2016   Severe aortic valve stenosis 04/01/2016   Bilateral pleural effusion 07/30/2021   Abnormal CT scan, kidney 07/30/2021   Resolved Ambulatory Problems    Diagnosis Date Noted   No Resolved Ambulatory Problems   Past Medical History:  Diagnosis Date   Breast cancer ([employer]) 2012   Hypertension    Thyroid disease    SOCIAL HX:  Social History   Tobacco Use   Smoking status: Former    Types: Cigarettes    Quit date: 1967    Years since quitting: 56.2   Smokeless tobacco: Never  Substance Use Topics   Alcohol use: No   FAMILY HX:  Family History  Problem Relation Age of Onset   Breast cancer Sister       ALLERGIES:  Allergies  Allergen Reactions   Other Other (See Comments)    "eye drop, unsure of name that caused  severe burning"   Amoxicillin Rash     PERTINENT MEDICATIONS:  Outpatient Encounter Medications as of 09/04/2021  Medication Sig   apixaban (ELIQUIS) 5 MG TABS tablet Take 1 tablet (5  mg total) by mouth 2 (two) times daily.   calcium carbonate (TUMS - DOSED IN MG ELEMENTAL CALCIUM) 500 MG chewable tablet Chew 1,000 mg by mouth daily.    Cholecalciferol (VITAMIN D3) 1000 UNITS CAPS Take 1 capsule by mouth daily.    COMBIGAN 0.2-0.5 % ophthalmic solution Place 1 drop into the right eye 2 (two) times daily.   DOCUSATE SODIUM RE Take 1 tablet by mouth daily.   furosemide (LASIX) 20 MG tablet Take 1 tablet (20 mg total) by mouth daily as needed for fluid (PRN for weight gain for >5 lbs in 2 days).   levothyroxine (SYNTHROID, LEVOTHROID) 75 MCG tablet Take 75 mcg by mouth daily before breakfast.    Multiple Vitamins-Minerals (PRESERVISION AREDS 2 PO) Take 1 tablet by mouth 2 (two) times daily.   Omega-3 1000 MG CAPS Take 2,000 mg by mouth daily.    ondansetron (ZOFRAN)  4 MG tablet Take 1 tablet (4 mg total) by mouth every 6 (six) hours as needed for nausea.   polyethylene glycol (MIRALAX / GLYCOLAX) 17 g packet Take 17 g by mouth daily.   prednisoLONE acetate (PRED FORTE) 1 % ophthalmic suspension Place 1 drop into the left eye 4 (four) times daily.   No facility-administered encounter medications on file as of 09/04/2021.   Thank you for the opportunity to participate in the care of Carrie [name].  The palliative care team will continue to follow. Please call our office at [phone] if we can be of additional assistance.   Ezekiel Slocumb, NP   COVID-19 PATIENT SCREENING TOOL Asked and negative response unless otherwise noted:  Have you had symptoms of covid, tested positive or been in contact with someone with symptoms/positive test in the past 5-10 days? No

## 2021-09-13 ENCOUNTER — Emergency Department
Admission: EM | Admit: 2021-09-13 | Discharge: 2021-09-13 | Disposition: A | Discharge status: Home / Self Care | Payer: Medicare Other | Attending: Emergency Medicine | Admitting: Emergency Medicine

## 2021-09-13 ENCOUNTER — Emergency Department: Payer: Medicare Other

## 2021-09-13 ENCOUNTER — Encounter: Payer: Self-pay | Admitting: Emergency Medicine

## 2021-09-13 ENCOUNTER — Other Ambulatory Visit: Payer: Self-pay

## 2021-09-13 DIAGNOSIS — I132 Hypertensive heart and chronic kidney disease with heart failure and with stage 5 chronic kidney disease, or end stage renal disease: Secondary | ICD-10-CM | POA: Insufficient documentation

## 2021-09-13 DIAGNOSIS — S81811A Laceration without foreign body, right lower leg, initial encounter: Secondary | ICD-10-CM | POA: Insufficient documentation

## 2021-09-13 DIAGNOSIS — E039 Hypothyroidism, unspecified: Secondary | ICD-10-CM | POA: Insufficient documentation

## 2021-09-13 DIAGNOSIS — I5030 Unspecified diastolic (congestive) heart failure: Secondary | ICD-10-CM | POA: Insufficient documentation

## 2021-09-13 DIAGNOSIS — W01198A Fall on same level from slipping, tripping and stumbling with subsequent striking against other object, initial encounter: Secondary | ICD-10-CM | POA: Insufficient documentation

## 2021-09-13 DIAGNOSIS — E871 Hypo-osmolality and hyponatremia: Secondary | ICD-10-CM | POA: Insufficient documentation

## 2021-09-13 DIAGNOSIS — I10 Essential (primary) hypertension: Secondary | ICD-10-CM

## 2021-09-13 DIAGNOSIS — S0101XA Laceration without foreign body of scalp, initial encounter: Secondary | ICD-10-CM | POA: Insufficient documentation

## 2021-09-13 DIAGNOSIS — N185 Chronic kidney disease, stage 5: Secondary | ICD-10-CM | POA: Insufficient documentation

## 2021-09-13 LAB — CBC WITH DIFFERENTIAL/PLATELET
Abs Immature Granulocytes: 0.02 10*3/uL (ref 0.00–0.07)
Basophils Absolute: 0 10*3/uL (ref 0.0–0.1)
Basophils Relative: 1 %
Eosinophils Absolute: 0.1 10*3/uL (ref 0.0–0.5)
Eosinophils Relative: 2 %
HCT: 39.8 % (ref 36.0–46.0)
Hemoglobin: 13.1 g/dL (ref 12.0–15.0)
Immature Granulocytes: 0 %
Lymphocytes Relative: 20 %
Lymphs Abs: 1.3 10*3/uL (ref 0.7–4.0)
MCH: 30 pg (ref 26.0–34.0)
MCHC: 32.9 g/dL (ref 30.0–36.0)
MCV: 91.1 fL (ref 80.0–100.0)
Monocytes Absolute: 1.1 10*3/uL — ABNORMAL HIGH (ref 0.1–1.0)
Monocytes Relative: 16 %
Neutro Abs: 4.1 10*3/uL (ref 1.7–7.7)
Neutrophils Relative %: 61 %
Platelets: 181 10*3/uL (ref 150–400)
RBC: 4.37 MIL/uL (ref 3.87–5.11)
RDW: 13.6 % (ref 11.5–15.5)
WBC: 6.6 10*3/uL (ref 4.0–10.5)
nRBC: 0 % (ref 0.0–0.2)

## 2021-09-13 LAB — COMPREHENSIVE METABOLIC PANEL
ALT: 148 U/L — ABNORMAL HIGH (ref 0–44)
AST: 87 U/L — ABNORMAL HIGH (ref 15–41)
Albumin: 4.2 g/dL (ref 3.5–5.0)
Alkaline Phosphatase: 98 U/L (ref 38–126)
Anion gap: 7 (ref 5–15)
BUN: 32 mg/dL — ABNORMAL HIGH (ref 8–23)
CO2: 27 mmol/L (ref 22–32)
Calcium: 9.2 mg/dL (ref 8.9–10.3)
Chloride: 95 mmol/L — ABNORMAL LOW (ref 98–111)
Creatinine, Ser: 0.96 mg/dL (ref 0.44–1.00)
GFR, Estimated: 54 mL/min — ABNORMAL LOW (ref >60)
Glucose, Bld: 123 mg/dL — ABNORMAL HIGH (ref 70–99)
Potassium: 4.3 mmol/L (ref 3.5–5.1)
Sodium: 129 mmol/L — ABNORMAL LOW (ref 135–145)
Total Bilirubin: 0.8 mg/dL (ref 0.3–1.2)
Total Protein: 6.6 g/dL (ref 6.5–8.1)

## 2021-09-13 LAB — PROTIME-INR
INR: 1.2 (ref 0.8–1.2)
Prothrombin Time: 15 seconds (ref 11.4–15.2)

## 2021-09-13 MED ORDER — HYDROCODONE-ACETAMINOPHEN 5-325 MG PO TABS
1.0000 | ORAL_TABLET | Freq: Once | ORAL | Status: DC
  Filled 2021-09-13: qty 1

## 2021-09-13 MED ORDER — ACETAMINOPHEN 325 MG PO TABS
650.0000 mg | ORAL_TABLET | Freq: Once | ORAL | Status: DC
  Filled 2021-09-13: qty 2

## 2021-09-13 MED ORDER — LIDOCAINE-EPINEPHRINE 2 %-1:100000 IJ SOLN
20.0000 mL | Freq: Once | INTRAMUSCULAR | Status: AC
  Administered 2021-09-13: 20 mL via INTRADERMAL

## 2021-09-13 NOTE — ED Triage Notes (Signed)
BIB by EMS from home. Pt lives alone but has family that cares for her.  Pt had trip and fall striking head on a flower pot and a chair struck her leg causing a large skin tear.  Pt is on eliquis.  Pt is blind in the left eye.  Pt states she was not dizzy or weak just tripped and fell trying to catch herself with the chair and pulling it on top of her.  ?

## 2021-09-13 NOTE — Discharge Instructions (Addendum)
Please have your blood pressure rechecked at your next PCP visit when you have your wound reassessed and potentially staples removed at that time. ?

## 2021-09-13 NOTE — ED Provider Notes (Signed)
? ?[facility] ?Provider Note ? ? ? Event Date/Time  ? First MD Initiated Contact with Patient 09/13/21 1819   ?  (approximate) ? ? ?History  ? ?Fall and Head Injury ? ? ?HPI ? ?Carrie [name] is a 88 y.o. female  with past medical history of severe aortic stenosis was declined aortic valve replacement in the past, HTN, HDL, diastolic heart failure, anticoagulant Eliquis for possible right atrial mass concerning for possible thrombus, hypothyroidism, and CKD who presents for evaluation after a fall.  Patient states he was at home where she lives alone when she tripped.  She is adamant to not pass out or feel lightheaded or dizzy or experience any other symptoms before falling.  She states she scraped her right lower leg and hit her head.  She denies hitting anywhere else and has no other pain including her neck, back, upper extremities torso or any else other in the lower extremities other than the right lower leg on the front part.  She last took her Eliquis this morning.  No other acute concerns at this time. ?  ? ? ?Physical Exam  ?Triage Vital Signs: ?ED Triage Vitals  ?Enc Vitals Group  ?   BP   ?   Pulse   ?   Resp   ?   Temp   ?   Temp src   ?   SpO2   ?   Weight   ?   Height   ?   Head Circumference   ?   Peak Flow   ?   Pain Score   ?   Pain Loc   ?   Pain Edu?   ?   Excl. in [place]?   ? ? ?Most recent vital signs: ?Vitals:  ? 09/13/21 1820 09/13/21 1945  ?BP: (!) 155/80 (!) 158/63  ?Pulse: 76 61  ?Resp: 20 18  ?Temp: 98.3 ?F (36.8 ?C)   ?SpO2: 100% 100%  ? ? ?General: Awake, no distress.  ?CV:  Good peripheral perfusion.  2+ radial and DP pulses. ?Resp:  Normal effort.  Clear bilaterally. ?Abd:  No distention.  Soft throughout. ?Other:  Approximately 1.5 cm bleeding laceration over the left parietal scalp.  There is a little bit of pulsatile flow.  No other lacerations noted.  No other obvious trauma to the face Or Neck.  Cranial Nerves II through XII Are Grossly Intact.  Patient Is  Oriented x3.  She Has Full Strength and Range Of Motion throughout All Extremities.  Sensation Is Intact to Light Touch in All Extremities.  There Is Approximately 4 Cm Superficial Skin Tear over the Right Lower Leg.  No Other Obvious Trauma.  Patient Has No Significant Surrounding Tenderness.  No Tenderness along the C/T/L-Spine. ? ? ?ED Results / Procedures / Treatments  ?Labs ?(all labs ordered are listed, but only abnormal results are displayed) ?Labs Reviewed  ?CBC WITH DIFFERENTIAL/PLATELET - Abnormal; Notable for the following components:  ?    Result Value  ? Monocytes Absolute 1.1 (*)   ? All other components within normal limits  ?COMPREHENSIVE METABOLIC PANEL - Abnormal; Notable for the following components:  ? Sodium 129 (*)   ? Chloride 95 (*)   ? Glucose, Bld 123 (*)   ? BUN 32 (*)   ? AST 87 (*)   ? ALT 148 (*)   ? GFR, Estimated 54 (*)   ? All other components within normal limits  ?PROTIME-INR  ? ? ? ?  EKG ? ? ? ?RADIOLOGY ? ?On my interpretation CT head and C-spine without evidence of acute intracranial hemorrhage, skull fracture or acute C-spine injury.  I also reviewed radiology interpretation and agree with the findings of same in addition to notation of some diffuse advanced degenerative changes in the C-spine. ? ?On my interpretation of the right tibia plain film I do not see any fracture dislocation.  I also reviewed radiology's interpretation. ? ? ?PROCEDURES: ? ?Critical Care performed: No ? ?Marland Kitchen.Laceration Repair ? ?Date/Time: 09/13/2021 6:43 PM ?Performed by: Lucrezia Starch, MD ?Authorized by: Lucrezia Starch, MD  ? ?Consent:  ?  Consent obtained:  Verbal and emergent situation ?  Consent given by:  Patient ?  Risks discussed:  Pain and infection ?Universal protocol:  ?  Procedure explained and questions answered to patient or proxy's satisfaction: yes   ?Laceration details:  ?  Length (cm):  1.5 ?Exploration:  ?  Wound exploration: entire depth of wound visualized   ?  Contaminated: no    ?Treatment:  ?  Area cleansed with:  Saline ?  Amount of cleaning:  Extensive ?  Irrigation solution:  Sterile saline ?  Irrigation method:  Syringe ?  Visualized foreign bodies/material removed: no   ?Skin repair:  ?  Repair method:  Staples ?  Number of staples:  4 ?Approximation:  ?  Approximation:  Close ?Repair type:  ?  Repair type:  Simple ?Post-procedure details:  ?  Dressing:  Open (no dressing) ?  Procedure completion:  Tolerated well, no immediate complications ? ? ?MEDICATIONS ORDERED IN ED: ?Medications  ?HYDROcodone-acetaminophen (NORCO/VICODIN) 5-325 MG per tablet 1 tablet (1 tablet Oral Patient Refused/Not Given 09/13/21 2021)  ?acetaminophen (TYLENOL) tablet 650 mg (650 mg Oral Patient Refused/Not Given 09/13/21 2022)  ?lidocaine-EPINEPHrine (XYLOCAINE W/EPI) 2 %-1:100000 (with pres) injection 20 mL (20 mLs Intradermal Given by Other 09/13/21 1840)  ? ? ? ?IMPRESSION / MDM / ASSESSMENT AND PLAN / ED COURSE  ?I reviewed the triage vital signs and the nursing notes. ?             ?               ? ?Differential diagnosis includes, but is not limited to scalp laceration with possible underlying skull fracture or intracranial hemorrhage, occult C-spine injury possibly occult tibia injury noted a skin tear noted in the patient's right leg.  She is otherwise neurovascular intact and I have low suspicion for other significant visceral or occult injury.  She describes clearly tripping and I have low suspicion for syncope, presyncope, or seizure. ? ?Scalp laceration repaired per procedure note above.  Steri-Strips placed over right lower extremity skin tear.  Tetanus is up-to-date from 2019 per patient. ? ?On my interpretation CT head and C-spine without evidence of acute intracranial hemorrhage, skull fracture or acute C-spine injury.  I also reviewed radiology interpretation and agree with the findings of same in addition to notation of some diffuse advanced degenerative changes in the C-spine. ? ?On my  interpretation of the right tibia plain film I do not see any fracture dislocation.  I also reviewed radiology's interpretation. ? ?CBC without leukocytosis or acute anemia and normal platelets.  CMP with a sodium of 129 compared to 26-monthago without other significant acute electrolyte or metabolic derangements.  Patient has a mild transaminitis which appears to be chronic and at baseline.  INR is unremarkable. ? ?Patient refusing analgesia emergency room.  He was able to ambulate  using a walker without any difficulty.  She is requesting to go home.  Given low suspicion for other immediate life-threatening process I think this is reasonable.  She has family who will stay with her for next couple days to provide any additional assistance she may need and I recommend she follow-up with her PCP in 7 to 10 days for wound recheck and staple removal.  Discharged in stable condition.  Strict return precautions advised and discussed.  Instructed in writing to have blood pressure rechecked at next PCP visit. ? ? ?  ? ? ?FINAL CLINICAL IMPRESSION(S) / ED DIAGNOSES  ? ?Final diagnoses:  ?Laceration of scalp, initial encounter  ?Noninfected skin tear of right lower extremity, initial encounter  ?Chronic hyponatremia  ?Hypertension, unspecified type  ? ? ? ?Rx / DC Orders  ? ?ED Discharge Orders   ? ? None  ? ?  ? ? ? ?Note:  This document was prepared using Dragon voice recognition software and may include unintentional dictation errors. ?  ?Lucrezia Starch, MD ?09/13/21 2024 ? ?

## 2021-09-13 NOTE — ED Notes (Signed)
Pt family did not wish to stay to sign discharge signature. Pt's head wrapped with non-adherent bandage and a cotton wrapping   ?

## 2021-10-09 ENCOUNTER — Other Ambulatory Visit: Payer: Medicare Other | Admitting: Student

## 2021-10-09 DIAGNOSIS — W19XXXD Unspecified fall, subsequent encounter: Secondary | ICD-10-CM

## 2021-10-09 DIAGNOSIS — I350 Nonrheumatic aortic (valve) stenosis: Secondary | ICD-10-CM

## 2021-10-09 DIAGNOSIS — Z515 Encounter for palliative care: Secondary | ICD-10-CM

## 2021-10-09 NOTE — Progress Notes (Signed)
? ? ?[facility] ?[facility] Palliative Care Consult Note ?Telephone: [phone]  ?Fax: [phone]  ? ? ?Date of encounter: 10/09/21 ?2:33 PM ?PATIENT NAME: Carrie [name] ?[place] [address]   ?[phone] (home)  ?DOB: 01/29/1923 ?MRN: [id number] ?PRIMARY CARE PROVIDER:    ?Marinda Elk, MD,  ?[place] [address] ?[phone] ? ?REFERRING PROVIDER:   ?Marinda Elk, MD ?[address] ?[place],  [place] [address] ?[phone] ? ?RESPONSIBLE PARTY:    ?Contact Information   ? ? Name Relation Home Work Mobile  ? Rudd,Eric Ralene Muskrat [phone]  ? Francesco Runner   [phone]  ? Malachy Chamber Niece   [phone]  ? ?  ? ? ? ?I met face to face with patient and family in the home. Palliative Care was asked to follow this patient by consultation request of  Marinda Elk, MD to address advance care planning and complex medical decision making. This is a follow up visit. ? ?                                 ASSESSMENT AND PLAN / RECOMMENDATIONS:  ? ?Advance Care Planning/Goals of Care: Goals include to maximize quality of life and symptom management. Patient/health care surrogate gave his/her permission to discuss. ?Our advance care planning conversation included a discussion about:    ?The value and importance of advance care planning  ?Experiences with loved ones who have been seriously ill or have died  ?Exploration of personal, cultural or spiritual beliefs that might influence medical decisions  ?Exploration of goals of care in the event of a sudden injury or illness  ?CODE STATUS: DNR ? ?Education on Palliative medicine; will continue to provide ongoing support, provide symptom management.  ? ?Symptom Management/Plan: ? ?Aortic stenosis-patient with severe aortic stenosis. She denies fatigue, shortness of breath, chest pain, light headedness or  dizziness. Will monitor for worsening symptoms. Follow up with cardiology as scheduled.  ?  ?S/p fall-patient s/p fall sustained laceration to scalp and skin tear to RLE. She is currently on Eliquis. She is to follow up with cardiology on whether she will continue on medication. She is encouraged to use walker for ambulation.  ? ?Follow up Palliative Care Visit: Palliative care will continue to follow for complex medical decision making, advance care planning, and clarification of goals. Return in 4 weeks or prn. ? ? ?This visit was coded based on medical decision making (MDM). ? ?PPS: 60% ? ?HOSPICE ELIGIBILITY/DIAGNOSIS: TBD ? ?Chief Complaint: Palliative Medicine follow up visit.  ? ?HISTORY OF PRESENT ILLNESS:  Carrie [name] is a 88 y.o. year old female  with severe aortic stenosis, hypertension, bradycardia, hyperlipidemia, hypothyroidism, CKD 3 AAA, ischemic colitis. Patient hospitalized 1/24 through 08/01/21 due to hyponatremia.  During hospitalization she was also found to have bilateral renal cysts with a 2.1 cm lesion of the left kidney that is concerning for renal cell carcinoma. Patient and family declined any further work-up. ED visit on 09/13/2021 due to fall.  ? ?Patient had a fall and sustained laceration to left side of head and RLE. She subsequently developed cellulitis to RLE. Family is currently changing dressing to wound. She endorses mild pain, declines to take anything at this time. Denies shortness of breath, fatigue. She does endorse LE edema that improves with diuretic and leg elevation. She endorses  good appetite.  A 10-point ROS is negative, except for the pertinent positives and negatives detailed per the HPI. ? ?History obtained from review of EMR, discussion with primary team, and interview with family, facility staff/caregiver and/or Ms. Rowser.  ?I reviewed available labs, medications, imaging, studies and related documents from the EMR.  Records reviewed and summarized above.   ? ? ?Physical Exam: ?Pulse 64, resp 18, 140/92, sats 98% on room air.  ?Constitutional: NAD ?General: frail appearing, thin ?EYES: anicteric sclera, lids intact, no discharge, impaired vision ?ENMT: intact hearing, oral mucous membranes moist ?CV: S1S2, RRR, murmur, trace LE edema ?Pulmonary: LCTA, no increased work of breathing, no cough, room air ?Abdomen: normo-active BS + 4 quadrants, soft and non tender ?GU: deferred ?MSK: +sarcopenia, moves all extremities, ambulatory ?Skin: warm and dry, no rashes, skin tear to RLE scabbed over, steri strips in place ?Neuro:  no generalized weakness,  no cognitive impairment ?Psych: non-anxious affect, A and O x 3 ?Hem/lymph/immuno: bruising to right arm ? ? ?Thank you for the opportunity to participate in the care of Ms. Carrie [name].  The palliative care team will continue to follow. Please call our office at [phone] if we can be of additional assistance.  ? ?Ezekiel Slocumb, NP  ? ?COVID-19 PATIENT SCREENING TOOL ?Asked and negative response unless otherwise noted:  ? ?Have you had symptoms of covid, tested positive or been in contact with someone with symptoms/positive test in the past 5-10 days? No ? ?

## 2021-11-06 ENCOUNTER — Other Ambulatory Visit: Payer: Medicare Other | Admitting: Student

## 2021-11-06 DIAGNOSIS — I350 Nonrheumatic aortic (valve) stenosis: Secondary | ICD-10-CM

## 2021-11-06 DIAGNOSIS — Z515 Encounter for palliative care: Secondary | ICD-10-CM

## 2021-11-06 NOTE — Progress Notes (Signed)
? ? ?Manufacturing engineer ?Community Palliative Care Consult Note ?Telephone: [phone]  ?Fax: [phone]  ? ? ?Date of encounter: 11/06/21 ?2:09 PM ?PATIENT [name]: TALYNN LEBON ?[place] [address]   ?[phone] (home)  ?DOB: 01-28-1923 ?MRN: [id number] ?PRIMARY CARE PROVIDER:    ?Marinda Elk, MD,  ?[address] [place] [address] ?[phone] ? ?REFERRING PROVIDER:   ?Marinda Elk, MD ?[address] ?[place],  [place] [address] ?[phone] ? ?RESPONSIBLE PARTY:    ?Contact Information   ? ? [name] Relation Home Work Mobile  ? Rudd,Eric Ralene Muskrat [phone]  ? Francesco Runner   [phone]  ? Malachy Chamber Niece   [phone]  ? ?  ? ? ? ?I met face to face with patient and family in the home. Palliative Care was asked to follow this patient by consultation request of  Marinda Elk, MD to address advance care planning and complex medical decision making. This is a follow up visit. ? ?                                 ASSESSMENT AND PLAN / RECOMMENDATIONS:  ? ?Advance Care Planning/Goals of Care: Goals include to maximize quality of life and symptom management. Patient/health care surrogate gave his/her permission to discuss. ? ?CODE STATUS: DNR ? ?Patient wishes to remain in the home, maintain as much independence as possible. Palliative will continue to provide ongoing support, symptom management.  ? ?Symptom Management/Plan: ? ?Aortic stenosis-patient with severe aortic stenosis. She denies fatigue, shortness of breath, chest pain, light headedness or dizziness. Will monitor for worsening symptoms. Patient seen by cardiology today; her Eliquis, aspirin and fish oil were discontinued. Follow up with cardiology as needed.  ? ?Follow up Palliative Care Visit: Palliative care will continue to follow for complex medical decision making, advance care planning, and  clarification of goals. Return in 4-6 weeks or prn. ? ?This visit was coded based on medical decision making (MDM). ? ?PPS: 60% ? ?HOSPICE ELIGIBILITY/DIAGNOSIS: TBD ? ?Chief Complaint: Palliative follow up visit.  ? ?HISTORY OF PRESENT ILLNESS:  Carrie [name] is a 88 y.o. year old female  with severe aortic stenosis, hypertension, bradycardia, hyperlipidemia, hypothyroidism, CKD 3 AAA, ischemic colitis.  ? ?Patient reports doing well. She was seen by cardiology today; her  Eliquis, aspirin and fish oil were discontinued. She is still walking 3 times a day. Appetite has been good; weight has been stable. Receiving Meals on Wheels. Sleeps well other than getting up to restroom.  LLE wound has healed. Denies any increased weakness, fatigue or shortness of breath. A 10-point ROS is negative, except for the pertinent positives and negatives detailed per the HPI. ? ?History obtained from review of EMR, discussion with primary team, and interview with family, facility staff/caregiver and/or Ms. Ferg.  ?I reviewed available labs, medications, imaging, studies and related documents from the EMR.  Records reviewed and summarized above.  ? ? ?Physical Exam: ? ?118/70, pulse 84, resp 16, sats 97% on room  ?Constitutional: NAD ?General: frail appearing, thin ?EYES: impaired vision, anicteric sclera, lids intact, Carrie discharge  ?ENMT: intact hearing, oral mucous membranes moist, dentition intact ?CV: S1S2, RRR, murmur, trace LE edema ?Pulmonary: LCTA, Carrie increased work of breathing, Carrie cough, room air ?Abdomen:  normo-active BS + 4 quadrants, soft and non tender, Carrie ascites ?GU: deferred ?  MSK: moves all extremities, ambulatory with walker ?Skin: warm and dry, Carrie rashes or wounds on visible skin ?Neuro:  Carrie generalized weakness,  Carrie cognitive impairment ?Psych: non-anxious affect, A and O x 3 ?Hem/lymph/immuno: Carrie widespread bruising ? ? ?Thank you for the opportunity to participate in the care of Ms. Engelhard.  The palliative  care team will continue to follow. Please call our office at [phone] if we can be of additional assistance.  ? ?Ezekiel Slocumb, NP  ? ?COVID-19 PATIENT SCREENING TOOL ?Asked and negative response unless otherwise noted:  ? ?Have you had symptoms of covid, tested positive or been in contact with someone with symptoms/positive test in the past 5-10 days? Carrie ? ?

## 2021-12-04 ENCOUNTER — Other Ambulatory Visit: Payer: Medicare Other | Admitting: Student

## 2021-12-04 DIAGNOSIS — I350 Nonrheumatic aortic (valve) stenosis: Secondary | ICD-10-CM

## 2021-12-04 DIAGNOSIS — Z515 Encounter for palliative care: Secondary | ICD-10-CM

## 2021-12-04 NOTE — Progress Notes (Unsigned)
[facility]er, [facility]ell[facility] Palliative Care Consult Note Telephone: [phone]  Fax: [phone]    Date of encounter: 12/04/21 2:35 PM PATIENT NAME: Carrie [name] [address] [place] [address]   [phone] (home)  DOB: [birth year] MRN: [id number] PRIMARY CARE PROVIDER:    Marinda Elk, MD,  [place] [address] [phone]  REFERRING PROVIDER:   Marinda Elk, MD [place],  [place] [address] [phone]  RESPONSIBLE PARTY:    Contact Information     Name Relation Home Work [address]   Pamalee Leyden [phone]   Francesco Runner   [phone]   Malachy Chamber Niece   [phone]        I met face to face with patient and family in the home. Palliative Care was asked to follow this patient by consultation request of  Marinda Elk, MD to address advance care planning and complex medical decision making. This is a follow up visit.                                   ASSESSMENT AND PLAN / RECOMMENDATIONS:   Advance Care Planning/Goals of Care: Goals include to maximize quality of life and symptom management. Patient/health care surrogate gave his/her permission to discuss. Our advance care planning conversation included a discussion about:    The value and importance of advance care planning  Experiences with loved ones who have been seriously ill or have died  Exploration of personal, cultural or spiritual beliefs that might influence medical decisions  Exploration of goals of care in the event of a sudden injury or illness  Identification of a healthcare agent  Review and updating or creation of an  advance directive document . Decision not to resuscitate or to de-escalate disease focused treatments due to poor prognosis. CODE STATUS: DNR  Symptom Management/Plan:    Follow up Palliative Care Visit:  Palliative care will continue to follow for complex medical decision making, advance care planning, and clarification of goals. Return *** weeks or prn.  I spent *** minutes providing this consultation. More than 50% of the time in this consultation was spent in counseling and care coordination.  This visit was coded based on medical decision making (MDM).***  PPS: ***0%  HOSPICE ELIGIBILITY/DIAGNOSIS: TBD  Chief Complaint: Palliative Medicine follow up visit.   HISTORY OF PRESENT ILLNESS:  Carrie [name] is a 88 y.o. year old female  with severe aortic stenosis, hypertension, bradycardia, hyperlipidemia, hypothyroidism, CKD 3 AAA, ischemic colitis.   Appetite is good. Sleeping well. A 10-point ROS is negative, except for the pertinent positives and negatives detailed per the HPI.   Marland Kitchen   History obtained from review of EMR, discussion with primary team, and interview with family, facility staff/caregiver and/or Ms. Forrer.  I reviewed available labs, medications, imaging, studies and related documents from the EMR.  Records reviewed and summarized above.   ROS  *** General: NAD EYES: denies vision changes ENMT: denies dysphagia Cardiovascular: denies chest pain, denies DOE Pulmonary: denies cough, denies increased SOB Abdomen: endorses good appetite, denies constipation, endorses continence of bowel GU: denies dysuria, endorses continence of urine MSK:  denies increased weakness,  no falls reported Skin: denies rashes or wounds Neurological: denies pain, denies insomnia Psych: Endorses positive mood Heme/lymph/immuno: denies bruises, abnormal bleeding  Physical Exam: Current  and past weights: Constitutional: NAD General: frail appearing, thin/WNWD/obese  EYES: anicteric sclera, lids intact, no discharge  ENMT: intact hearing, oral mucous membranes moist, dentition intact CV: S1S2, RRR, no LE edema Pulmonary: LCTA, no increased work of breathing, no cough, room air Abdomen:  intake 100%, normo-active BS + 4 quadrants, soft and non tender, no ascites GU: deferred MSK: no sarcopenia, moves all extremities, ambulatory Skin: warm and dry, no rashes or wounds on visible skin Neuro:  no generalized weakness,  no cognitive impairment Psych: non-anxious affect, A and O x 3 Hem/lymph/immuno: no widespread bruising   Thank you for the opportunity to participate in the care of Ms. Christians.  The palliative care team will continue to follow. Please call our office at [phone] if we can be of additional assistance.   Ezekiel Slocumb, NP   COVID-19 PATIENT SCREENING TOOL Asked and negative response unless otherwise noted:   Have you had symptoms of covid, tested positive or been in contact with someone with symptoms/positive test in the past 5-10 days? No

## 2022-01-15 ENCOUNTER — Other Ambulatory Visit: Payer: Medicare Other | Admitting: Student

## 2022-01-15 DIAGNOSIS — I350 Nonrheumatic aortic (valve) stenosis: Secondary | ICD-10-CM

## 2022-01-15 DIAGNOSIS — Z515 Encounter for palliative care: Secondary | ICD-10-CM

## 2022-01-15 NOTE — Progress Notes (Signed)
[facility] Palliative Care Consult Note Telephone: [phone]  Fax: [phone]    Date of encounter: 01/15/22 3:08 PM PATIENT NAME: Carrie [name] [address] [place] [address]   [phone] (home)  DOB: 1922/08/14 MRN: [id number] PRIMARY CARE PROVIDER:    Marinda Elk, MD,  [place] [address] [phone]  REFERRING PROVIDER:   Marinda Elk, MD [place],  [place] [address] [phone]  RESPONSIBLE PARTY:    Contact Information     Name Relation Home Work [address]   Pamalee Leyden [phone]   Francesco Runner   [place], [place] Niece   [phone]        I met face to face with patient in the home. Palliative Care was asked to follow this patient by consultation request of  Marinda Elk, MD to address advance care planning and complex medical decision making. This is a follow up visit.                                   ASSESSMENT AND PLAN / RECOMMENDATIONS:   Advance Care Planning/Goals of Care: Goals include to maximize quality of life and symptom management. Patient/health care surrogate gave his/her permission to discuss.  CODE STATUS: DNR  Symptom Management/Plan:  Aortic stenosis-patient with severe aortic stenosis; she denies any symptoms such as fatigue, shortness of breath, chest pain, light headedness or dizziness. No functional declines reported. Education provided on disease process. Will monitor for worsening symptoms.  Follow up Palliative Care Visit: Palliative care will continue to follow for complex medical decision making, advance care planning, and clarification of goals. Return in 8 weeks or prn.   This visit was coded based on medical decision making (MDM).  PPS: 60%  HOSPICE ELIGIBILITY/DIAGNOSIS: TBD  Chief Complaint: Palliative Medicine  follow up visit.   HISTORY OF PRESENT ILLNESS:  Carrie [name] is a 88 y.o. year old female  with severe aortic stenosis, hypertension, bradycardia, hyperlipidemia, hypothyroidism, CKD 3 AAA, ischemic colitis, macular degeneration.    Patient reports doing well. Still ambulating 3 times a day. No falls since March. Seen ophthalmologist, will be stopping preservision otherwise no medication changes. Denies pain, chest pain, shortness of breath or fatigue. Endorses regular bowel movements; takes miralax PRN. Sleeping well. Endorses good appetite. A 10-point ROS is negative, except for the pertinent positives and negatives detailed per the HPI.  History obtained from review of EMR, discussion with primary team, and interview with family, facility staff/caregiver and/or Ms. Mickle.  I reviewed available labs, medications, imaging, studies and related documents from the EMR.  Records reviewed and summarized above.    Physical Exam:  Pulse 62, resp 16, b/p 148/68, sats 96% on room air Constitutional: NAD General: frail appearing, thin EYES: anicteric sclera, lids intact, no discharge, impaired vision ENMT: intact hearing, oral mucous membranes moist CV: S1S2, RRR, murmur, trace LE edema Pulmonary: LCTA, no increased work of breathing, no cough, room air Abdomen: normo-active BS + 4 quadrants, soft and non tender GU: deferred MSK: moves all extremities, ambulatory Skin: warm and dry, no rashes or wounds on visible skin Neuro:  no generalized weakness,  no cognitive impairment Psych: non-anxious affect, A and O x 3, pleasant Hem/lymph/immuno: no widespread bruising   Thank you for the opportunity to participate in the  care of Ms. Hosein.  The palliative care team will continue to follow. Please call our office at [phone] if we can be of additional assistance.   Ezekiel Slocumb, NP   COVID-19 PATIENT SCREENING TOOL Asked and negative response unless otherwise noted:   Have you had  symptoms of covid, tested positive or been in contact with someone with symptoms/positive test in the past 5-10 days? No

## 2022-03-19 ENCOUNTER — Other Ambulatory Visit: Payer: Medicare Other | Admitting: Student

## 2022-03-19 DIAGNOSIS — R609 Edema, unspecified: Secondary | ICD-10-CM

## 2022-03-19 DIAGNOSIS — I350 Nonrheumatic aortic (valve) stenosis: Secondary | ICD-10-CM

## 2022-03-19 DIAGNOSIS — Z515 Encounter for palliative care: Secondary | ICD-10-CM

## 2022-03-19 NOTE — Progress Notes (Signed)
[facility] Palliative Care Consult Note Telephone: [phone]  Fax: [phone]    Date of encounter: 03/19/22 2:37 PM PATIENT NAME: Carrie [name] [address] [place] [address]   [phone] (home)  DOB: 1923-06-06 MRN: [id number] PRIMARY CARE PROVIDER:    Marinda Elk, MD,  [place] [address] [phone]  REFERRING PROVIDER:   Marinda Elk, MD [place],  [place] [address] [phone]  RESPONSIBLE PARTY:    Contact Information     Name Relation Home Work [address]   Pamalee Leyden [phone]   Francesco Runner   [place], [place] Niece   [phone]        I met face to face with patient in the home. Palliative Care was asked to follow this patient by consultation request of  Marinda Elk, MD to address advance care planning and complex medical decision making. This is a follow up visit.                                   ASSESSMENT AND PLAN / RECOMMENDATIONS:   Advance Care Planning/Goals of Care: Goals include to maximize quality of life and symptom management. Patient/health care surrogate gave his/her permission to discuss.  CODE STATUS: DNR  Palliative Medicine will continue to provide ongoing supportive care, symptom management as needed.   Symptom Management/Plan:  Aortic stenosis-patient with severe aortic stenosis; she denies any symptoms such as fatigue, shortness of breath, chest pain, light headedness or dizziness. No functional declines reported. Education provided on disease process. Will monitor for worsening symptoms.  LE edema-patient with 2 + LE edema. Encourage elevating legs, recommend compression socks. She has worn compression stockings in the past, but they are difficult for her to put on. Continue furosemide as directed.   Follow up Palliative  Care Visit: Palliative care will continue to follow for complex medical decision making, advance care planning, and clarification of goals. Return in 10-12 weeks or prn.  This visit was coded based on medical decision making (MDM).  PPS: 60%  HOSPICE ELIGIBILITY/DIAGNOSIS: TBD  Chief Complaint: Palliative Medicine follow up visit.   HISTORY OF PRESENT ILLNESS:  KEYAIRRA [name] is a 88 y.o. year old female  with  severe aortic stenosis, hypertension, bradycardia, hyperlipidemia, hypothyroidism, CKD 3 AAA, ischemic colitis, macular degeneration.    Right knee pain had worsened; received cortisone injections to bilateral knees. Pain has improved. Appetite has been good. Denies shortness of breath, fatigue, nausea or constipation. Lower extremity edema. She states her impaired vision is main hindrance. She is able to complete adl's. Still ambulating throughout the house; no falls. Takes diuretic; up some during the night, otherwise sleeps well. Had lab work last week; upcoming appointment with PCP.   History obtained from review of EMR, discussion with primary team, and interview with family, facility staff/caregiver and/or Ms. Vickerman.  I reviewed available labs, medications, imaging, studies and related documents from the EMR.  Records reviewed and summarized above.   ROS  10-point ROS is negative, except for the pertinent positives and negatives detailed per the HPI.  Physical Exam: Pulse 68, resp 16, b/p 150/70, sats 97% on room air Constitutional: NAD General: frail appearing, thin EYES: anicteric sclera, lids intact, no discharge, impaired vision ENMT: intact hearing, oral mucous membranes moist CV: S1S2, RRR,  2+ LE edema Pulmonary: LCTA, no increased work of breathing, no cough, room air Abdomen:  normo-active BS + 4 quadrants, soft and non tender GU: deferred MSK: moves all extremities, ambulatory with walker Skin: warm and dry, no rashes or wounds on visible skin Neuro:  no  generalized weakness,  no cognitive impairment Psych: non-anxious affect, A and O x 3 Hem/lymph/immuno: no widespread bruising   Thank you for the opportunity to participate in the care of Ms. Vitale.  The palliative care team will continue to follow. Please call our office at [phone] if we can be of additional assistance.   Ezekiel Slocumb, NP   COVID-19 PATIENT SCREENING TOOL Asked and negative response unless otherwise noted:   Have you had symptoms of covid, tested positive or been in contact with someone with symptoms/positive test in the past 5-10 days? No

## 2022-04-14 ENCOUNTER — Other Ambulatory Visit
Admission: RE | Admit: 2022-04-14 | Discharge: 2022-04-14 | Disposition: A | Discharge status: Home / Self Care | Payer: Medicare Other | Source: Ambulatory Visit | Attending: Physician Assistant | Admitting: Physician Assistant

## 2022-04-14 DIAGNOSIS — I509 Heart failure, unspecified: Secondary | ICD-10-CM | POA: Insufficient documentation

## 2022-04-14 DIAGNOSIS — R0602 Shortness of breath: Secondary | ICD-10-CM | POA: Insufficient documentation

## 2022-04-14 LAB — BRAIN NATRIURETIC PEPTIDE: B Natriuretic Peptide: 3878.1 pg/mL — ABNORMAL HIGH (ref 0.0–100.0)

## 2022-04-28 ENCOUNTER — Telehealth: Payer: Self-pay

## 2022-04-28 NOTE — Telephone Encounter (Signed)
6 am.  Request received from [facility] , NP to schedule a home visit with patient due to recent HF diagnosis.  Phone call made to niece Cassie to schedule visit.  No answer.  Message left requesting a call back.

## 2022-04-28 NOTE — Telephone Encounter (Signed)
12 pm.  Incoming call from Cassie.  Okay for visit tomorrow at 1 pm.

## 2022-04-29 ENCOUNTER — Other Ambulatory Visit: Payer: Medicare Other

## 2022-04-29 VITALS — BP 140/72 | HR 81 | Temp 97.2°F | Wt 101.0 lb

## 2022-04-29 DIAGNOSIS — Z515 Encounter for palliative care: Secondary | ICD-10-CM

## 2022-04-29 NOTE — Progress Notes (Signed)
PATIENT NAME: Carrie [name] DOB: 1923/05/23 MRN: [id number]  PRIMARY CARE PROVIDER: Patrice Paradise, MD  RESPONSIBLE PARTY:  Acct ID - Guarantor Home Phone Work Phone Relationship Acct Type  [id number] Larena Sox* [id number]  Self P/F     [address], [place], [place] [address]    Functional Status:  Patient is ambulatory with a rolling walker.  No recent falls. Taking 1 1/2 hours to do sponge bath, dressed and hair completed. She will then rest and put her shoes on.  Patient endorses dyspnea and has to pace herself and take breaks in between tasks.  Appetite:  Patient endorses appetite is good to fair.  Eating 3 meals a day.  Receiving meals on wheels. Patient advises it takes about 1 hour to do light meal prep, eat and clean up.  She paces herself due to dyspnea.   Edema:  Improving lower extremity edema.  Now 2+ from bilateral thighs to ankles.  Niece Wilford Sports states this has improved since Monday.  Discussion over recommendation to switch from Furosemide to Torsemide as recommended by PCP office had with patient.  At this time, patient desires to remain on Furosemide.  Skin:  right posterior leg with hematoma/fluid pocket present.  Education provided to [facility] on protection, treatment, s/s of infection to area.  Cassie has wound care supplies in the home from previous skin tears/wounds.   Follow up visit scheduled for next Monday.      PHYSICAL EXAM:   VITALS: Today's Vitals   04/29/22 1300  BP: (!) 140/72  Pulse: 81  Temp: (!) 97.2 F (36.2 C)  SpO2: 98%  Weight: 101 lb (45.8 kg)  PainSc: 0-No pain    LUNGS: clear to auscultation  CARDIAC: murmur EXTREMITIES: 2+ pitting edema present from bilateral thigh to ankles. SKIN: Skin color, texture, turgor normal. No rashes or lesions or right posterior thigh with hematoma/fluid pocket   NEURO: positive for gait problems and weakness       Truitt Merle, RN

## 2022-05-05 ENCOUNTER — Other Ambulatory Visit: Payer: Medicare Other

## 2022-05-05 VITALS — BP 142/74 | HR 69 | Temp 97.7°F | Wt 100.0 lb

## 2022-05-05 DIAGNOSIS — Z515 Encounter for palliative care: Secondary | ICD-10-CM

## 2022-05-05 NOTE — Progress Notes (Deleted)
PATIENT NAME: Carrie [name] DOB: 11-12-1922 MRN: [id number]  PRIMARY CARE PROVIDER: Marinda Elk, MD  RESPONSIBLE PARTY:  Acct ID - Guarantor Home Phone Work Phone Relationship Acct Type  [id number] [name]  Self P/F     [address], [place],  [address]    PLAN OF CARE and INTERVENTIONS:               1.  GOALS OF CARE/ ADVANCE CARE PLANNING:  ***               2.  PATIENT/CAREGIVER EDUCATION:  ***               4. PERSONAL EMERGENCY PLAN:  ***               5.  DISEASE STATUS:***    HISTORY OF PRESENT ILLNESS:    CODE STATUS:   Code Status: Prior  ADVANCED DIRECTIVES: N MOST FORM: {Responses; yes/no} PPS: {NUMBERS 0%-100%:21292}   PHYSICAL EXAM:   VITALS:There were no vitals filed for this visit.  LUNGS: {SYSTEM LUNGS ADULT/PED WERX:54008} CARDIAC: {Mis exam cardio:32073}} *** EXTREMITIES: {Exam; extremity:10330} SKIN: {Findings; skin exam-one line:31329::"Skin color, texture, turgor normal. No rashes or lesions"}  NEURO: {Findings; ROS neuro:30532::"negative"}       Lorenza Burton, RN

## 2022-05-05 NOTE — Progress Notes (Signed)
PATIENT NAME: Carrie [name] DOB: January 08, 1923 MRN: [id number]  PRIMARY CARE PROVIDER: Marinda Elk, MD  RESPONSIBLE PARTY:  Acct ID - Guarantor Home Phone Work Phone Relationship Acct Type  [id number] Carrie [phone]  Self P/F     [address], [place], [place] [address]   Follow up visit completed with patient and niece Carrie [name].  Edema:  Lower extremity edema remains unchanged from last Thursday.  Continues with 2+ from bilateral lower thigh to ankles.  Weight has remained at 100 lbs over the last week.  Weight obtained on today's visit with no change.  Patient endorses taking furosemide as ordered.  Advised patient that I would follow up with Carrie Boxer, NP and Mimi, PA regarding current status and possible change with medications.  PCP recommended torsemide last week but patient declined to change from furosemide.  She is more open to changing if this is needed to manage her edema.   Dyspnea: Appears worse today.  Patient visibly short of breath when returning to the living room from her bathroom.  Patient recovers after 2-3 minutes of rest.  Patient endorses breathing is an effort for her now.  Lungs sounds are CTA bilaterally.  Fatigue:  Patient feels more fatigued today.   States it is an effort to breath, eat and walk.    Hospice:  Carrie [name] advised they have spoken to a friend who has hospice experience to get a better picture of services.  Family is planning on speaking with patient further about this.  I have advised they have this conversation soon as patient will likely qualify for hospice given her ongoing decline despite medication adjustments.  Update provided to [facility], NP.  She has requested PCP office be updated, see if torsemide can be considered and advise NP will see patient on Thursday to discuss hospice further with patient.       PHYSICAL EXAM:   VITALS: Today's Vitals   05/05/22 1258  BP: (!) 142/74  Pulse: 69  Temp: 97.7 F (36.5 C)   SpO2: 96%  Weight: 100 lb (45.4 kg)  PainSc: 0-No pain    LUNGS: clear to auscultation  CARDIAC: murmur EXTREMITIES: 2+ pitting edema to bilateral lower extremities from lower thigh to ankles. SKIN: Skin color, texture, turgor normal. No rashes or lesions or right posterior blister remains intact.   NEURO: positive for gait problems and weakness       Carrie Burton, RN

## 2022-05-06 ENCOUNTER — Telehealth: Payer: Self-pay

## 2022-05-06 NOTE — Telephone Encounter (Signed)
945 am.  Message received from [place], nurse for PCP.  Okay torsemide 20 mg bid.  Return call made to PCP office to request script be sent to [facility] at patient request.  Message left with receptionist.

## 2022-05-07 ENCOUNTER — Other Ambulatory Visit: Payer: Medicare Other | Admitting: Student

## 2022-05-07 DIAGNOSIS — Z515 Encounter for palliative care: Secondary | ICD-10-CM

## 2022-05-07 DIAGNOSIS — I350 Nonrheumatic aortic (valve) stenosis: Secondary | ICD-10-CM

## 2022-05-07 DIAGNOSIS — I509 Heart failure, unspecified: Secondary | ICD-10-CM

## 2022-05-07 NOTE — Progress Notes (Addendum)
[facility] Palliative Care Consult Note Telephone: [phone]  Fax: [phone]    Date of encounter: 05/07/22 3:16 PM PATIENT NAME: Carrie [name] [address] [place] [address]   [phone] (home)  DOB: 08/03/1922 MRN: [id number] PRIMARY CARE PROVIDER:    Marinda Elk, MD,  [place] [address] [phone]  REFERRING PROVIDER:   Marinda Elk, MD [place],  [place] [address] [phone]  RESPONSIBLE PARTY:    Contact Information     Name Relation Home Work [address]   Pamalee Leyden [phone] [id number] [phone]   Francesco Runner   [phone]   Malachy Chamber Niece   [phone]        I met face to face with patient and family in the home. Palliative Care was asked to follow this patient by consultation request of  Marinda Elk, MD to address advance care planning and complex medical decision making. This is a follow up visit.                                   ASSESSMENT AND PLAN / RECOMMENDATIONS:   Advance Care Planning/Goals of Care: Goals include to maximize quality of life and symptom management. Patient/health care surrogate gave his/her permission to discuss. Our advance care planning conversation included a discussion about:    The value and importance of advance care planning  Experiences with loved ones who have been seriously ill or have died  Exploration of personal, cultural or spiritual beliefs that might influence medical decisions  Exploration of goals of care in the event of a sudden injury or illness  Review and updating or creation of an  advance directive document  CODE STATUS: DNR  Education provided on Palliative Medicine vs. Hospice. Patient agrees with hospice evaluation, wishes to be managed in the home. PCP notified with request for hospice and if she will serve as hospice  attending; awaiting response.  Symptom Management/Plan:  Congestive Heart failure, severe aortic stenosis-patient with increased fatigue, weakness, shortness of breath at rest and with exertion, worsening edema. Appetite declining. She is being switched from furosemide to torsemide and awaiting new prescription. Patient expresses wanting to "go home." It is taking her much more time to complete adl's. Patient would like to be managed in the home. We discussed energy conservation, receiving assist with adl's. She is encouraged to keep her legs elevated; continue diuretics as ordered. Will proceed with hospice evaluation.   Follow up Palliative Care Visit: Palliative care will continue to follow for complex medical decision making, advance care planning, and clarification of goals. Return prn.  I spent 60 minutes providing this consultation. More than 50% of the time in this consultation was spent in counseling and care coordination.   PPS: 40%  HOSPICE ELIGIBILITY/DIAGNOSIS: TBD  Chief Complaint: Palliative Medicine follow up visit.   HISTORY OF PRESENT ILLNESS:  Carrie MORR is a 88 y.o. year old female  with severe aortic stenosis, congestive heart failure, hypertension, bradycardia, hyperlipidemia, hypothyroidism, CKD 3, ischemic colitis, macular degeneration.      Patient reports "falling apart." She endorses worsening fatigue, shortness of breath at rest and with any exertion. She states it is a task to eat and drink. It is taking patient much longer to complete adl's. She is napping more. She has pitting edema up to  her thighs. She denies pain, chest pain. She reports her appetite declining. She is napping more during the day. Patient's niece Rubin Payor is present.   Patient received sitting in her living room, legs elevated. She has bilateral pitting edema that extends up to her thighs. She has a fluid pocket to left medial calf. She is visibly short of breath at times when speaking. She is  also short of breath with ambulating less than 10 feet.   History obtained from review of EMR, discussion with primary team, and interview with family, facility staff/caregiver and/or Ms. Winton.  I reviewed available labs, medications, imaging, studies and related documents from the EMR.  Records reviewed and summarized above.   ROS  10-point ROS is negative, except for the pertinent positives and negatives detailed per the HPI.   Physical Exam: Weight: 100 pounds Pulse 60. Resp 22, b/p 132/ 78, sats 95% on room air Constitutional: NAD General: frail appearing, ill appearing EYES: anicteric sclera, lids intact, no discharge  ENMT: intact hearing, oral mucous membranes moist, dentition intact CV: irregular rate, rhythm, murmur, 2-3 + pitting BLE edema Pulmonary: LCTA, increased work of breathing, no cough, room air Abdomen: normo-active BS + 4 quadrants, soft and non tender, no ascites GU: deferred MSK: moves all extremities, ambulatory with walker Skin: warm and dry, no rashes  Neuro:  + generalized weakness,  no cognitive impairment Psych: non-anxious affect, A and O x 3 Hem/lymph/immuno: no widespread bruising   Thank you for the opportunity to participate in the care of Carrie [name].  The palliative care team will continue to follow. Please call our office at [phone] if we can be of additional assistance.   Ezekiel Slocumb, NP   COVID-19 PATIENT SCREENING TOOL Asked and negative response unless otherwise noted:   Have you had symptoms of covid, tested positive or been in contact with someone with symptoms/positive test in the past 5-10 days? No

## 2022-05-12 ENCOUNTER — Other Ambulatory Visit: Payer: Medicare Other | Admitting: Student

## 2022-05-21 ENCOUNTER — Other Ambulatory Visit: Payer: Medicare Other | Admitting: Student

## 2023-01-04 DEATH — deceased
